# Patient Record
Sex: Male | Born: 1970 | Race: Black or African American | Hispanic: No | Marital: Single
Health system: Midwestern US, Community
[De-identification: ages and names within clinical notes are randomized; demographics above are authoritative.]

## PROBLEM LIST (undated history)

## (undated) DIAGNOSIS — K219 Gastro-esophageal reflux disease without esophagitis: Secondary | ICD-10-CM

## (undated) DIAGNOSIS — G43909 Migraine, unspecified, not intractable, without status migrainosus: Secondary | ICD-10-CM

## (undated) DIAGNOSIS — N529 Male erectile dysfunction, unspecified: Secondary | ICD-10-CM

## (undated) DIAGNOSIS — T884XXA Failed or difficult intubation, initial encounter: Secondary | ICD-10-CM

## (undated) DIAGNOSIS — I10 Essential (primary) hypertension: Secondary | ICD-10-CM

## (undated) DIAGNOSIS — K603 Anal fistula, unspecified: Secondary | ICD-10-CM

## (undated) DIAGNOSIS — E785 Hyperlipidemia, unspecified: Secondary | ICD-10-CM

## (undated) DIAGNOSIS — T8859XA Other complications of anesthesia, initial encounter: Secondary | ICD-10-CM

## (undated) DIAGNOSIS — K611 Rectal abscess: Secondary | ICD-10-CM

## (undated) DIAGNOSIS — K5730 Diverticulosis of large intestine without perforation or abscess without bleeding: Secondary | ICD-10-CM

## (undated) DIAGNOSIS — K649 Unspecified hemorrhoids: Secondary | ICD-10-CM

## (undated) DIAGNOSIS — Z87898 Personal history of other specified conditions: Secondary | ICD-10-CM

## (undated) DIAGNOSIS — Z8719 Personal history of other diseases of the digestive system: Secondary | ICD-10-CM

## (undated) DIAGNOSIS — G4733 Obstructive sleep apnea (adult) (pediatric): Secondary | ICD-10-CM

## (undated) DIAGNOSIS — T4145XA Adverse effect of unspecified anesthetic, initial encounter: Secondary | ICD-10-CM

## (undated) DIAGNOSIS — R52 Pain, unspecified: Secondary | ICD-10-CM

## (undated) DIAGNOSIS — Z0000 Encounter for general adult medical examination without abnormal findings: Secondary | ICD-10-CM

## (undated) DIAGNOSIS — R300 Dysuria: Principal | ICD-10-CM

## (undated) DIAGNOSIS — R053 Chronic cough: Secondary | ICD-10-CM

## (undated) DIAGNOSIS — J4521 Mild intermittent asthma with (acute) exacerbation: Secondary | ICD-10-CM

## (undated) DIAGNOSIS — J209 Acute bronchitis, unspecified: Secondary | ICD-10-CM

## (undated) DIAGNOSIS — G8929 Other chronic pain: Principal | ICD-10-CM

## (undated) DIAGNOSIS — E119 Type 2 diabetes mellitus without complications: Principal | ICD-10-CM

## (undated) DIAGNOSIS — R319 Hematuria, unspecified: Secondary | ICD-10-CM

## (undated) DIAGNOSIS — K4091 Unilateral inguinal hernia, without obstruction or gangrene, recurrent: Secondary | ICD-10-CM

## (undated) DIAGNOSIS — R0609 Other forms of dyspnea: Secondary | ICD-10-CM

## (undated) DIAGNOSIS — R519 Headache, unspecified: Secondary | ICD-10-CM

## (undated) DIAGNOSIS — R350 Frequency of micturition: Secondary | ICD-10-CM

## (undated) DIAGNOSIS — E1165 Type 2 diabetes mellitus with hyperglycemia: Principal | ICD-10-CM

## (undated) DIAGNOSIS — J069 Acute upper respiratory infection, unspecified: Secondary | ICD-10-CM

## (undated) DIAGNOSIS — J40 Bronchitis, not specified as acute or chronic: Secondary | ICD-10-CM

## (undated) DIAGNOSIS — G44209 Tension-type headache, unspecified, not intractable: Principal | ICD-10-CM

## (undated) DIAGNOSIS — E782 Mixed hyperlipidemia: Principal | ICD-10-CM

## (undated) HISTORY — DX: Hyperlipidemia, unspecified: E78.5

## (undated) HISTORY — DX: Rectal abscess: K61.1

## (undated) HISTORY — PX: CHOLECYSTECTOMY: SHX55

## (undated) HISTORY — PX: ROTATOR CUFF REPAIR: SHX139

## (undated) HISTORY — DX: Migraine, unspecified, not intractable, without status migrainosus: G43.909

## (undated) HISTORY — DX: Gastro-esophageal reflux disease without esophagitis: K21.9

## (undated) HISTORY — DX: Male erectile dysfunction, unspecified: N52.9

## (undated) HISTORY — PX: TONSILLECTOMY AND ADENOIDECTOMY: SUR1326

---

## 1997-06-20 HISTORY — PX: LUMBAR SPINE SURGERY: SHX701

## 2010-06-12 ENCOUNTER — Observation Stay: Payer: Self-pay | Admitting: Internal Medicine

## 2011-06-21 HISTORY — PX: OTHER SURGICAL HISTORY: SHX169

## 2015-09-22 DIAGNOSIS — M75122 Complete rotator cuff tear or rupture of left shoulder, not specified as traumatic: Secondary | ICD-10-CM | POA: Diagnosis not present

## 2015-09-22 DIAGNOSIS — R29898 Other symptoms and signs involving the musculoskeletal system: Secondary | ICD-10-CM | POA: Diagnosis not present

## 2015-09-30 DIAGNOSIS — M25612 Stiffness of left shoulder, not elsewhere classified: Secondary | ICD-10-CM | POA: Diagnosis not present

## 2015-09-30 DIAGNOSIS — M25512 Pain in left shoulder: Secondary | ICD-10-CM | POA: Diagnosis not present

## 2015-10-06 DIAGNOSIS — M7582 Other shoulder lesions, left shoulder: Secondary | ICD-10-CM | POA: Diagnosis not present

## 2015-10-06 DIAGNOSIS — M25512 Pain in left shoulder: Secondary | ICD-10-CM | POA: Diagnosis not present

## 2015-10-06 DIAGNOSIS — M75122 Complete rotator cuff tear or rupture of left shoulder, not specified as traumatic: Secondary | ICD-10-CM | POA: Diagnosis not present

## 2015-10-08 DIAGNOSIS — M75122 Complete rotator cuff tear or rupture of left shoulder, not specified as traumatic: Secondary | ICD-10-CM | POA: Diagnosis not present

## 2015-10-08 DIAGNOSIS — I10 Essential (primary) hypertension: Secondary | ICD-10-CM | POA: Diagnosis not present

## 2015-10-08 DIAGNOSIS — R1013 Epigastric pain: Secondary | ICD-10-CM | POA: Diagnosis not present

## 2015-10-08 DIAGNOSIS — M25512 Pain in left shoulder: Secondary | ICD-10-CM | POA: Diagnosis not present

## 2015-10-08 DIAGNOSIS — R29898 Other symptoms and signs involving the musculoskeletal system: Secondary | ICD-10-CM | POA: Diagnosis not present

## 2015-10-08 DIAGNOSIS — R197 Diarrhea, unspecified: Secondary | ICD-10-CM | POA: Diagnosis not present

## 2015-10-08 DIAGNOSIS — R1011 Right upper quadrant pain: Secondary | ICD-10-CM | POA: Diagnosis not present

## 2015-10-08 DIAGNOSIS — R112 Nausea with vomiting, unspecified: Secondary | ICD-10-CM | POA: Diagnosis not present

## 2015-10-12 DIAGNOSIS — M25512 Pain in left shoulder: Secondary | ICD-10-CM | POA: Diagnosis not present

## 2015-10-12 DIAGNOSIS — M25612 Stiffness of left shoulder, not elsewhere classified: Secondary | ICD-10-CM | POA: Diagnosis not present

## 2015-10-12 DIAGNOSIS — R29898 Other symptoms and signs involving the musculoskeletal system: Secondary | ICD-10-CM | POA: Diagnosis not present

## 2015-10-14 DIAGNOSIS — K529 Noninfective gastroenteritis and colitis, unspecified: Secondary | ICD-10-CM | POA: Diagnosis not present

## 2015-10-14 DIAGNOSIS — M25612 Stiffness of left shoulder, not elsewhere classified: Secondary | ICD-10-CM | POA: Diagnosis not present

## 2015-10-14 DIAGNOSIS — M25512 Pain in left shoulder: Secondary | ICD-10-CM | POA: Diagnosis not present

## 2015-10-14 DIAGNOSIS — R1011 Right upper quadrant pain: Secondary | ICD-10-CM | POA: Diagnosis not present

## 2015-10-15 DIAGNOSIS — M5137 Other intervertebral disc degeneration, lumbosacral region: Secondary | ICD-10-CM | POA: Diagnosis not present

## 2015-10-15 DIAGNOSIS — M545 Low back pain: Secondary | ICD-10-CM | POA: Diagnosis not present

## 2015-10-20 DIAGNOSIS — M7542 Impingement syndrome of left shoulder: Secondary | ICD-10-CM | POA: Diagnosis not present

## 2015-10-20 DIAGNOSIS — M25512 Pain in left shoulder: Secondary | ICD-10-CM | POA: Diagnosis not present

## 2015-10-20 DIAGNOSIS — M25612 Stiffness of left shoulder, not elsewhere classified: Secondary | ICD-10-CM | POA: Diagnosis not present

## 2015-10-20 DIAGNOSIS — R29898 Other symptoms and signs involving the musculoskeletal system: Secondary | ICD-10-CM | POA: Diagnosis not present

## 2015-10-21 DIAGNOSIS — M5137 Other intervertebral disc degeneration, lumbosacral region: Secondary | ICD-10-CM | POA: Diagnosis not present

## 2015-10-21 DIAGNOSIS — M545 Low back pain: Secondary | ICD-10-CM | POA: Diagnosis not present

## 2015-10-26 DIAGNOSIS — M545 Low back pain: Secondary | ICD-10-CM | POA: Diagnosis not present

## 2015-10-26 DIAGNOSIS — M5137 Other intervertebral disc degeneration, lumbosacral region: Secondary | ICD-10-CM | POA: Diagnosis not present

## 2015-10-28 DIAGNOSIS — M25512 Pain in left shoulder: Secondary | ICD-10-CM | POA: Diagnosis not present

## 2015-10-28 DIAGNOSIS — M7542 Impingement syndrome of left shoulder: Secondary | ICD-10-CM | POA: Diagnosis not present

## 2015-10-28 DIAGNOSIS — M7582 Other shoulder lesions, left shoulder: Secondary | ICD-10-CM | POA: Diagnosis not present

## 2015-10-28 DIAGNOSIS — M75122 Complete rotator cuff tear or rupture of left shoulder, not specified as traumatic: Secondary | ICD-10-CM | POA: Diagnosis not present

## 2015-10-28 DIAGNOSIS — R29898 Other symptoms and signs involving the musculoskeletal system: Secondary | ICD-10-CM | POA: Diagnosis not present

## 2015-11-12 ENCOUNTER — Emergency Department: Payer: Federal, State, Local not specified - PPO | Admitting: Anesthesiology

## 2015-11-12 ENCOUNTER — Emergency Department: Payer: Federal, State, Local not specified - PPO

## 2015-11-12 ENCOUNTER — Encounter
Admission: EM | Disposition: A | Discharge status: Home / Self Care | Payer: Self-pay | Source: Home / Self Care | Attending: Emergency Medicine

## 2015-11-12 ENCOUNTER — Observation Stay
Admission: EM | Admit: 2015-11-12 | Discharge: 2015-11-15 | Disposition: A | Discharge status: Home / Self Care | Payer: Federal, State, Local not specified - PPO | Attending: Surgery | Admitting: Surgery

## 2015-11-12 ENCOUNTER — Encounter: Payer: Self-pay | Admitting: Emergency Medicine

## 2015-11-12 DIAGNOSIS — Z9889 Other specified postprocedural states: Secondary | ICD-10-CM | POA: Insufficient documentation

## 2015-11-12 DIAGNOSIS — K4090 Unilateral inguinal hernia, without obstruction or gangrene, not specified as recurrent: Secondary | ICD-10-CM | POA: Insufficient documentation

## 2015-11-12 DIAGNOSIS — K611 Rectal abscess: Secondary | ICD-10-CM | POA: Diagnosis present

## 2015-11-12 DIAGNOSIS — A419 Sepsis, unspecified organism: Secondary | ICD-10-CM | POA: Diagnosis not present

## 2015-11-12 DIAGNOSIS — Z9049 Acquired absence of other specified parts of digestive tract: Secondary | ICD-10-CM | POA: Insufficient documentation

## 2015-11-12 DIAGNOSIS — Z87891 Personal history of nicotine dependence: Secondary | ICD-10-CM | POA: Insufficient documentation

## 2015-11-12 DIAGNOSIS — Z888 Allergy status to other drugs, medicaments and biological substances status: Secondary | ICD-10-CM | POA: Diagnosis not present

## 2015-11-12 DIAGNOSIS — R339 Retention of urine, unspecified: Secondary | ICD-10-CM | POA: Insufficient documentation

## 2015-11-12 DIAGNOSIS — R000 Tachycardia, unspecified: Secondary | ICD-10-CM | POA: Insufficient documentation

## 2015-11-12 DIAGNOSIS — K613 Ischiorectal abscess: Principal | ICD-10-CM | POA: Insufficient documentation

## 2015-11-12 DIAGNOSIS — E876 Hypokalemia: Secondary | ICD-10-CM | POA: Diagnosis not present

## 2015-11-12 DIAGNOSIS — T888 Other specified complications of surgical and medical care, not elsewhere classified: Secondary | ICD-10-CM | POA: Diagnosis not present

## 2015-11-12 DIAGNOSIS — K610 Anal abscess: Secondary | ICD-10-CM | POA: Diagnosis not present

## 2015-11-12 DIAGNOSIS — I10 Essential (primary) hypertension: Secondary | ICD-10-CM | POA: Diagnosis not present

## 2015-11-12 DIAGNOSIS — G4730 Sleep apnea, unspecified: Secondary | ICD-10-CM | POA: Diagnosis not present

## 2015-11-12 DIAGNOSIS — Z79899 Other long term (current) drug therapy: Secondary | ICD-10-CM | POA: Diagnosis not present

## 2015-11-12 DIAGNOSIS — Z87898 Personal history of other specified conditions: Secondary | ICD-10-CM

## 2015-11-12 DIAGNOSIS — R197 Diarrhea, unspecified: Secondary | ICD-10-CM | POA: Insufficient documentation

## 2015-11-12 HISTORY — DX: Essential (primary) hypertension: I10

## 2015-11-12 HISTORY — PX: INCISION AND DRAINAGE ABSCESS: SHX5864

## 2015-11-12 HISTORY — DX: Personal history of other specified conditions: Z87.898

## 2015-11-12 LAB — URINALYSIS COMPLETE WITH MICROSCOPIC (ARMC ONLY)
BILIRUBIN URINE: NEGATIVE
Bacteria, UA: NONE SEEN
GLUCOSE, UA: NEGATIVE mg/dL
KETONES UR: NEGATIVE mg/dL
LEUKOCYTES UA: NEGATIVE
Nitrite: NEGATIVE
PH: 6 (ref 5.0–8.0)
Protein, ur: NEGATIVE mg/dL
SQUAMOUS EPITHELIAL / LPF: NONE SEEN
Specific Gravity, Urine: 1.014 (ref 1.005–1.030)

## 2015-11-12 LAB — COMPREHENSIVE METABOLIC PANEL
ALBUMIN: 4.5 g/dL (ref 3.5–5.0)
ALT: 19 U/L (ref 17–63)
AST: 16 U/L (ref 15–41)
Alkaline Phosphatase: 66 U/L (ref 38–126)
Anion gap: 9 (ref 5–15)
BUN: 10 mg/dL (ref 6–20)
CHLORIDE: 99 mmol/L — AB (ref 101–111)
CO2: 27 mmol/L (ref 22–32)
Calcium: 9.4 mg/dL (ref 8.9–10.3)
Creatinine, Ser: 0.97 mg/dL (ref 0.61–1.24)
GFR calc Af Amer: >60 mL/min (ref >60)
GLUCOSE: 121 mg/dL — AB (ref 65–99)
POTASSIUM: 3 mmol/L — AB (ref 3.5–5.1)
Sodium: 135 mmol/L (ref 135–145)
Total Bilirubin: 0.9 mg/dL (ref 0.3–1.2)
Total Protein: 8.4 g/dL — ABNORMAL HIGH (ref 6.5–8.1)

## 2015-11-12 LAB — CBC WITH DIFFERENTIAL/PLATELET
BASOS ABS: 0 10*3/uL (ref 0–0.1)
Basophils Relative: 0 %
Eosinophils Absolute: 0.5 10*3/uL (ref 0–0.7)
Eosinophils Relative: 3 %
HEMATOCRIT: 38.2 % — AB (ref 40.0–52.0)
HEMOGLOBIN: 13.2 g/dL (ref 13.0–18.0)
Lymphocytes Relative: 7 %
Lymphs Abs: 1.2 10*3/uL (ref 1.0–3.6)
MCH: 29.3 pg (ref 26.0–34.0)
MCHC: 34.5 g/dL (ref 32.0–36.0)
MCV: 85 fL (ref 80.0–100.0)
Monocytes Absolute: 1 10*3/uL (ref 0.2–1.0)
NEUTROS ABS: 13.9 10*3/uL — AB (ref 1.4–6.5)
Platelets: 282 10*3/uL (ref 150–440)
RBC: 4.49 MIL/uL (ref 4.40–5.90)
RDW: 13.2 % (ref 11.5–14.5)
WBC: 16.6 10*3/uL — ABNORMAL HIGH (ref 3.8–10.6)

## 2015-11-12 LAB — LACTIC ACID, PLASMA: LACTIC ACID, VENOUS: 1 mmol/L (ref 0.5–2.0)

## 2015-11-12 SURGERY — INCISION AND DRAINAGE, ABSCESS
Anesthesia: General | Site: Rectum | Wound class: Dirty or Infected

## 2015-11-12 MED ORDER — MORPHINE SULFATE (PF) 4 MG/ML IV SOLN: 4.0000 mg | INTRAVENOUS | Status: DC | PRN

## 2015-11-12 MED ORDER — KETOROLAC TROMETHAMINE 30 MG/ML IJ SOLN
30.0000 mg | Freq: Four times a day (QID) | INTRAMUSCULAR | Status: DC
  Administered 2015-11-13 – 2015-11-15 (×9): 30 mg via INTRAVENOUS
  Filled 2015-11-12 (×10): qty 1

## 2015-11-12 MED ORDER — MORPHINE SULFATE (PF) 4 MG/ML IV SOLN
4.0000 mg | Freq: Once | INTRAVENOUS | Status: AC
  Administered 2015-11-12: 4 mg via INTRAVENOUS
  Filled 2015-11-12: qty 1

## 2015-11-12 MED ORDER — ACETAMINOPHEN 500 MG PO TABS
1000.0000 mg | ORAL_TABLET | Freq: Four times a day (QID) | ORAL | Status: DC
  Administered 2015-11-13 – 2015-11-15 (×9): 1000 mg via ORAL
  Filled 2015-11-12 (×10): qty 2

## 2015-11-12 MED ORDER — PIPERACILLIN-TAZOBACTAM 3.375 G IVPB 30 MIN
3.3750 g | Freq: Once | INTRAVENOUS | Status: AC
  Administered 2015-11-12: 3.375 g via INTRAVENOUS
  Filled 2015-11-12: qty 50

## 2015-11-12 MED ORDER — POTASSIUM CHLORIDE 10 MEQ/100ML IV SOLN
10.0000 meq | Freq: Once | INTRAVENOUS | Status: AC
  Administered 2015-11-12: 10 meq via INTRAVENOUS
  Filled 2015-11-12: qty 100

## 2015-11-12 MED ORDER — KETOROLAC TROMETHAMINE 30 MG/ML IJ SOLN
INTRAMUSCULAR | Status: DC | PRN
  Administered 2015-11-12: 30 mg via INTRAVENOUS

## 2015-11-12 MED ORDER — IRBESARTAN 150 MG PO TABS: 300.0000 mg | ORAL_TABLET | Freq: Every day | ORAL | Status: DC

## 2015-11-12 MED ORDER — IOPAMIDOL (ISOVUE-300) INJECTION 61%
100.0000 mL | Freq: Once | INTRAVENOUS | Status: AC | PRN
  Administered 2015-11-12: 100 mL via INTRAVENOUS

## 2015-11-12 MED ORDER — FENTANYL CITRATE (PF) 100 MCG/2ML IJ SOLN
INTRAMUSCULAR | Status: DC | PRN
  Administered 2015-11-12: 100 ug via INTRAVENOUS
  Administered 2015-11-12: 50 ug via INTRAVENOUS

## 2015-11-12 MED ORDER — PHENYLEPHRINE HCL 10 MG/ML IJ SOLN
INTRAMUSCULAR | Status: DC | PRN
  Administered 2015-11-12 (×2): 150 ug via INTRAVENOUS

## 2015-11-12 MED ORDER — ONDANSETRON HCL 4 MG/2ML IJ SOLN
INTRAMUSCULAR | Status: DC | PRN
Start: 2015-11-12 — End: 2015-11-12
  Administered 2015-11-12: 4 mg via INTRAVENOUS

## 2015-11-12 MED ORDER — BUPIVACAINE-EPINEPHRINE (PF) 0.25% -1:200000 IJ SOLN
INTRAMUSCULAR | Status: AC
  Filled 2015-11-12: qty 30

## 2015-11-12 MED ORDER — SODIUM CHLORIDE 0.9 % IV SOLN
INTRAVENOUS | Status: DC | PRN
  Administered 2015-11-12: 21:00:00 via INTRAVENOUS

## 2015-11-12 MED ORDER — VALSARTAN-HYDROCHLOROTHIAZIDE 320-25 MG PO TABS: 1.0000 | ORAL_TABLET | Freq: Every day | ORAL | Status: DC

## 2015-11-12 MED ORDER — ONDANSETRON HCL 4 MG/2ML IJ SOLN: 4.0000 mg | Freq: Once | INTRAMUSCULAR | Status: DC | PRN

## 2015-11-12 MED ORDER — ENOXAPARIN SODIUM 40 MG/0.4ML ~~LOC~~ SOLN
40.0000 mg | SUBCUTANEOUS | Status: DC
  Administered 2015-11-12 – 2015-11-14 (×3): 40 mg via SUBCUTANEOUS
  Filled 2015-11-12 (×3): qty 0.4

## 2015-11-12 MED ORDER — METRONIDAZOLE IN NACL 5-0.79 MG/ML-% IV SOLN
500.0000 mg | Freq: Three times a day (TID) | INTRAVENOUS | Status: DC
  Administered 2015-11-12 – 2015-11-14 (×5): 500 mg via INTRAVENOUS
  Filled 2015-11-12 (×7): qty 100

## 2015-11-12 MED ORDER — SODIUM CHLORIDE 0.9 % IV BOLUS (SEPSIS)
500.0000 mL | INTRAVENOUS | Status: AC
  Administered 2015-11-12: 500 mL via INTRAVENOUS

## 2015-11-12 MED ORDER — HYDROCHLOROTHIAZIDE 25 MG PO TABS: 25.0000 mg | ORAL_TABLET | Freq: Every day | ORAL | Status: DC

## 2015-11-12 MED ORDER — PROPOFOL 10 MG/ML IV BOLUS
INTRAVENOUS | Status: DC | PRN
  Administered 2015-11-12: 200 mg via INTRAVENOUS

## 2015-11-12 MED ORDER — METOPROLOL SUCCINATE ER 25 MG PO TB24: 25.0000 mg | ORAL_TABLET | Freq: Every day | ORAL | Status: DC

## 2015-11-12 MED ORDER — LACTATED RINGERS IV BOLUS (SEPSIS)
1000.0000 mL | Freq: Once | INTRAVENOUS | Status: DC
  Filled 2015-11-12: qty 1000

## 2015-11-12 MED ORDER — REMIFENTANIL HCL 1 MG IV SOLR
INTRAVENOUS | Status: DC | PRN
  Administered 2015-11-12: .15 ug/kg/min via INTRAVENOUS

## 2015-11-12 MED ORDER — ONDANSETRON 8 MG PO TBDP: 4.0000 mg | ORAL_TABLET | Freq: Four times a day (QID) | ORAL | Status: DC | PRN

## 2015-11-12 MED ORDER — HYDROMORPHONE HCL 1 MG/ML IJ SOLN
1.0000 mg | INTRAMUSCULAR | Status: AC
  Administered 2015-11-12: 1 mg via INTRAVENOUS
  Filled 2015-11-12: qty 1

## 2015-11-12 MED ORDER — ONDANSETRON HCL 4 MG/2ML IJ SOLN: 4.0000 mg | Freq: Four times a day (QID) | INTRAMUSCULAR | Status: DC | PRN

## 2015-11-12 MED ORDER — OXYCODONE HCL 5 MG PO TABS
5.0000 mg | ORAL_TABLET | ORAL | Status: DC | PRN
  Administered 2015-11-12: 5 mg via ORAL
  Administered 2015-11-13 – 2015-11-15 (×2): 10 mg via ORAL
  Filled 2015-11-12 (×3): qty 2
  Filled 2015-11-12: qty 1

## 2015-11-12 MED ORDER — FENTANYL CITRATE (PF) 100 MCG/2ML IJ SOLN
25.0000 ug | INTRAMUSCULAR | Status: DC | PRN
Start: 2015-11-12 — End: 2015-11-12

## 2015-11-12 MED ORDER — BUPIVACAINE-EPINEPHRINE (PF) 0.25% -1:200000 IJ SOLN
INTRAMUSCULAR | Status: DC | PRN
  Administered 2015-11-12: 30 mL

## 2015-11-12 MED ORDER — LIDOCAINE HCL (CARDIAC) 20 MG/ML IV SOLN
INTRAVENOUS | Status: DC | PRN
  Administered 2015-11-12 (×2): 100 mg via INTRAVENOUS

## 2015-11-12 MED ORDER — POTASSIUM CHLORIDE CRYS ER 20 MEQ PO TBCR
40.0000 meq | EXTENDED_RELEASE_TABLET | Freq: Once | ORAL | Status: AC
  Administered 2015-11-12: 40 meq via ORAL
  Filled 2015-11-12: qty 2

## 2015-11-12 MED ORDER — LACTATED RINGERS IV SOLN
INTRAVENOUS | Status: DC
  Administered 2015-11-12 – 2015-11-14 (×5): via INTRAVENOUS

## 2015-11-12 MED ORDER — DIATRIZOATE MEGLUMINE & SODIUM 66-10 % PO SOLN
15.0000 mL | Freq: Once | ORAL | Status: AC
  Administered 2015-11-12: 15 mL via ORAL

## 2015-11-12 MED ORDER — SUCCINYLCHOLINE CHLORIDE 20 MG/ML IJ SOLN
INTRAMUSCULAR | Status: DC | PRN
  Administered 2015-11-12: 130 mg via INTRAVENOUS

## 2015-11-12 MED ORDER — CIPROFLOXACIN IN D5W 400 MG/200ML IV SOLN
400.0000 mg | Freq: Two times a day (BID) | INTRAVENOUS | Status: DC
  Administered 2015-11-13 – 2015-11-14 (×3): 400 mg via INTRAVENOUS
  Filled 2015-11-12 (×6): qty 200

## 2015-11-12 MED ORDER — PANTOPRAZOLE SODIUM 40 MG IV SOLR
40.0000 mg | Freq: Every day | INTRAVENOUS | Status: DC
Start: 2015-11-12 — End: 2015-11-15
  Administered 2015-11-12 – 2015-11-15 (×3): 40 mg via INTRAVENOUS
  Filled 2015-11-12 (×3): qty 40

## 2015-11-12 SURGICAL SUPPLY — 23 items
BLADE CLIPPER SURG (BLADE) ×2 IMPLANT
BLADE SURG 15 STRL LF DISP TIS (BLADE) ×1 IMPLANT
BLADE SURG 15 STRL SS (BLADE) ×1
CANISTER SUCT 1200ML W/VALVE (MISCELLANEOUS) ×2 IMPLANT
DRAPE LEGGINS SURG 28X43 STRL (DRAPES) ×2 IMPLANT
DRAPE UNDER BUTTOCK W/FLU (DRAPES) ×2 IMPLANT
ELECT REM PT RETURN 9FT ADLT (ELECTROSURGICAL) ×2
ELECTRODE REM PT RTRN 9FT ADLT (ELECTROSURGICAL) ×1 IMPLANT
GLOVE BIO SURGEON STRL SZ7 (GLOVE) ×2 IMPLANT
GOWN STRL REUS W/ TWL LRG LVL3 (GOWN DISPOSABLE) ×2 IMPLANT
GOWN STRL REUS W/TWL LRG LVL3 (GOWN DISPOSABLE) ×2
JELLY LUB 2OZ STRL (MISCELLANEOUS) ×1
JELLY LUBE 2OZ STRL (MISCELLANEOUS) ×1 IMPLANT
NEEDLE HYPO 22GX1.5 SAFETY (NEEDLE) ×2 IMPLANT
NS IRRIG 1000ML POUR BTL (IV SOLUTION) ×2 IMPLANT
PACK BASIN MINOR ARMC (MISCELLANEOUS) ×2 IMPLANT
PAD PREP 24X41 OB/GYN DISP (PERSONAL CARE ITEMS) ×2 IMPLANT
SCRUB POVIDONE IODINE 4 OZ (MISCELLANEOUS) ×2 IMPLANT
SOL PREP PVP 2OZ (MISCELLANEOUS) ×2
SOLUTION PREP PVP 2OZ (MISCELLANEOUS) ×1 IMPLANT
SPONGE LAP 18X18 5 PK (GAUZE/BANDAGES/DRESSINGS) ×4 IMPLANT
SWAB DUAL CULTURE TRANS RED ST (MISCELLANEOUS) ×2 IMPLANT
SYR 20CC LL (SYRINGE) ×2 IMPLANT

## 2015-11-12 NOTE — ED Notes (Signed)
Bladder scan performed , noted.

## 2015-11-12 NOTE — Progress Notes (Signed)
Patient ID: Curtis Harper, male   DOB: [birth year], 45 y.o.   MRN: [id number]  History of Present Illness Curtis Harper is a 45 y.o. male asked to see in consultation by the emergency room several coronary to the perianal abscess. Patient reports that he started having severe anorectal pain starting yesterday. Pain is sharp and is worsening when she is sits down. He also had associated urinary retention. He is however able to pass urine. He did have a similar episode a few years ago and that percent the same way with retention and renal abscess. He reports some subjective chills. Pain is nonradiating and only partially relieved with some IV narcotics given by the emergency room. Workup has included CBC reveals an increase in white count and CT scan revealing a perianal abscess extending into the prostate. This is deepened there is also an additional collection superior to this. No evidence of Fournier gangrene's are no evidence of necrotizing infection  Past Medical History Past Medical History  Diagnosis Date  . Urinary retention   . Abscess   . Hypertension      Past Surgical History  Procedure Laterality Date  . Abscess drainage    . Rotator cuff repair    . Cholecystectomy      Allergies  Allergen Reactions  . Lisinopril Other (See Comments)    Reaction: unknown    Current Facility-Administered Medications  Medication Dose Route Frequency Provider Last Rate Last Dose  . lactated ringers bolus 1,000 mL  1,000 mL Intravenous Once Leafy Ro, MD      . piperacillin-tazobactam (ZOSYN) IVPB 3.375 g  3.375 g Intravenous Once Loleta Rose, MD 100 mL/hr at 11/12/15 1958 3.375 g at 11/12/15 1958  . potassium chloride 10 mEq in 100 mL IVPB  10 mEq Intravenous Once Loleta Rose, MD 100 mL/hr at 11/12/15 1942 10 mEq at 11/12/15 1942   Current Outpatient Prescriptions  Medication Sig Dispense Refill  . metoprolol succinate (TOPROL-XL) 25 MG 24 hr tablet Take 25 mg by mouth daily.    Marland Kitchen  omeprazole (PRILOSEC) 40 MG capsule Take 40 mg by mouth daily.    . valsartan-hydrochlorothiazide (DIOVAN-HCT) 320-25 MG tablet Take 1 tablet by mouth daily.      Family History History reviewed. No pertinent family history.    Social History Social History  Substance Use Topics  . Smoking status: Former Games developer  . Smokeless tobacco: Never Used  . Alcohol Use: Yes     Comment: Occ     ROS 10 point ROS performed and is otherwise negative  Physical Exam Blood pressure 116/64, pulse 115, temperature 99.9 F (37.7 C), temperature source Oral, resp. rate 28, height  (1.803 m), weight 122.471 kg (270 lb), SpO2 97 %.  CONSTITUTIONAL: Patient is no acute distress but does complain of pain. Non toxic EYES: Pupils equal, round, and reactive to light, Sclera non-icteric. EARS, NOSE, MOUTH AND THROAT: The oropharynx is clear. Oral mucosa is pink and moist. Hearing is intact to voice.  NECK: Trachea is midline, and there is no jugular venous distension. Thyroid is without palpable abnormalities. LYMPH NODES:  Lymph nodes in the neck are not enlarged. RESPIRATORY:  Lungs are clear, and breath sounds are equal bilaterally. Normal respiratory effort without pathologic use of accessory muscles. CARDIOVASCULAR: Heart is regular without murmurs, gallops, or rubs. GI: The abdomen is  soft, nontender, and nondistended. There were no palpable masses. There was no hepatosplenomegaly. There were normal bowel sounds. Rectal: perirectal abscess,  fluctuance and squeeze tenderness to palpation, no evidence of necrotizing infection MUSCULOSKELETAL:  Normal muscle strength and tone in all four extremities.    SKIN: Skin turgor is normal. There are no pathologic skin lesions.  NEUROLOGIC:  Motor and sensation is grossly normal.  Cranial nerves are grossly intact. PSYCH:  Alert and oriented to person, place and time. Affect is normal.  Data Reviewed  I have personally reviewed the patient's imaging and  medical records.    Assessment/Plan  45 year old morbidly obese male with a history of sleep apnea now presents with perianal/ rectal abscess extending into the prostate causing some urinary symptoms. Discussed with the patient in detail about the need for exam under anesthesia and I&D in the OR. Procedure discussed with the patient in detail, risk, benefits and possible complications including but not limited to: Bleeding, infection, recurrence, injury to adjacent structures. He understands and wishes to proceed. We'll start resuscitation with crystalloids and  broad-spectrum antibiotics. All questions were answered.  Sterling Bigiego Pabon, MD FACS  Diego F Pabon 11/12/2015, 8:21 PM

## 2015-11-12 NOTE — ED Notes (Signed)
While in triage with this RN pt c/o dizziness at this time. EKG obtained due to patient dizziness and tachycardia.

## 2015-11-12 NOTE — Anesthesia Preprocedure Evaluation (Signed)
Anesthesia Evaluation  Patient identified by MRN, date of birth, ID band Patient awake    Reviewed: Allergy & Precautions, NPO status , Patient's Chart, lab work & pertinent test results  History of Anesthesia Complications Negative for: history of anesthetic complications  Airway Mallampati: III       Dental   Pulmonary sleep apnea and Continuous Positive Airway Pressure Ventilation , former smoker,           Cardiovascular hypertension, Pt. on medications and Pt. on home beta blockers      Neuro/Psych negative neurological ROS     GI/Hepatic Neg liver ROS, GERD  Medicated and Controlled,  Endo/Other  negative endocrine ROS  Renal/GU negative Renal ROS     Musculoskeletal   Abdominal   Peds  Hematology negative hematology ROS (+)   Anesthesia Other Findings   Reproductive/Obstetrics                             Anesthesia Physical Anesthesia Plan  ASA: II and emergent  Anesthesia Plan: General   Post-op Pain Management:    Induction: Intravenous  Airway Management Planned: LMA  Additional Equipment:   Intra-op Plan:   Post-operative Plan:   Informed Consent: I have reviewed the patients History and Physical, chart, labs and discussed the procedure including the risks, benefits and alternatives for the proposed anesthesia with the patient or authorized representative who has indicated his/her understanding and acceptance.     Plan Discussed with:   Anesthesia Plan Comments:         Anesthesia Quick Evaluation

## 2015-11-12 NOTE — ED Provider Notes (Signed)
[facility] Emergency Department Provider Note  ____________________________________________  Time seen: Approximately 5:55 PM  I have reviewed the triage vital signs and the nursing notes.   HISTORY  Chief Complaint Urinary Retention and Pain L buttocks     HPI Curtis Harper is a 45 y.o. male with a past medical history that he describes as having a buttock abscess that was so severe it was causing impingement on his urethra which caused urinary retention.  He had a recurrence that did not require surgery a few years ago, and he presents today complaining of acute onset urinary retention again today with a sore spot on his buttocks and he is afraid he has a new abscess or infection that is causing symptoms.  He denies fever and general malaise as well as pain in lower abdomen.  He feels like he cannot urinate and has not not urinated for about 45 hours.  He denies chest pain, shortness of breath, nausea, vomiting, diarrhea, dysuria (prior to the urinary retention).  His symptoms are getting gradually worse over time and nothing is making them better.  He states that his prior episodes and his surgery took place in [place].   Past Medical History  Diagnosis Date  . Urinary retention   . Abscess   . Hypertension     Patient Active Problem List   Diagnosis Date Noted  . Perirectal abscess 11/12/2015  . Perianal abscess     Past Surgical History  Procedure Laterality Date  . Abscess drainage    . Rotator cuff repair    . Cholecystectomy      No current outpatient prescriptions on file.  Allergies Lisinopril  History reviewed. No pertinent family history.  Social History Social History  Substance Use Topics  . Smoking status: Former Games developermoker  . Smokeless tobacco: Never Used  . Alcohol Use: Yes     Comment: Occ    Review of Systems Constitutional: Denies fever/chills Eyes: No visual changes. ENT: No sore throat. Cardiovascular:  Denies chest pain. Respiratory: Denies shortness of breath. Gastrointestinal: Lower abdominal pain.  No nausea, no vomiting.  No diarrhea.  No constipation.  Left buttock tender area Genitourinary: +urinary retention.   Musculoskeletal: Negative for back pain. Skin: Negative for rash. Neurological: Negative for headaches, focal weakness or numbness.  10-point ROS otherwise negative.  ____________________________________________   PHYSICAL EXAM:  VITAL SIGNS: ED Triage Vitals  Enc Vitals Group     BP 11/12/15 1514 137/73 mmHg     Pulse Rate 11/12/15 1514 116     Resp 11/12/15 1514 18     Temp 11/12/15 1514 99.9 F (37.7 C)     Temp Source 11/12/15 1514 Oral     SpO2 11/12/15 1514 96 %     Weight 11/12/15 1514 270 lb (122.471 kg)     Height 11/12/15 1514 5\' 11"  (1.803 m)     Head Cir --      Peak Flow --      Pain Score 11/12/15 1515 10     Pain Loc --      Pain Edu? --      Excl. in GC? --     Constitutional: Alert and oriented. Generally well-appearing but does appear uncomfortable Eyes: Conjunctivae are normal. PERRL. EOMI. Head: Atraumatic. Nose: No congestion/rhinnorhea. Mouth/Throat: Mucous membranes are moist.  Oropharynx non-erythematous. Neck: No stridor.  No meningeal signs.   Cardiovascular: Tachycardia, regular rhythm. Good peripheral circulation. Grossly normal heart sounds.   Respiratory:  Normal respiratory effort.  No retractions. Lungs CTAB. Gastrointestinal: Obesity, mild lower abdominal tenderness to palpation.  Approximately 2 cm in diameter fluctuant area on the left buttock just inferior to the middle which is only mildly tender to palpation. Musculoskeletal: No lower extremity tenderness nor edema. No gross deformities of extremities. Neurologic:  Normal speech and language. No gross focal neurologic deficits are appreciated.  Skin:  Skin is warm, dry and intact. No rash noted. Psychiatric: Mood and affect are normal. Speech and behavior are  normal.  ____________________________________________   LABS (all labs ordered are listed, but only abnormal results are displayed)  Labs Reviewed  CBC WITH DIFFERENTIAL/PLATELET - Abnormal; Notable for the following:    WBC 16.6 (*)    HCT 38.2 (*)    Neutro Abs 13.9 (*)    All other components within normal limits  COMPREHENSIVE METABOLIC PANEL - Abnormal; Notable for the following:    Potassium 3.0 (*)    Chloride 99 (*)    Glucose, Bld 121 (*)    Total Protein 8.4 (*)    All other components within normal limits  URINALYSIS COMPLETEWITH MICROSCOPIC ([facility] ONLY) - Abnormal; Notable for the following:    Color, Urine YELLOW (*)    APPearance CLEAR (*)    Hgb urine dipstick 2+ (*)    All other components within normal limits  URINE CULTURE  CULTURE, BLOOD (ROUTINE X 2)  CULTURE, BLOOD (ROUTINE X 2)  LACTIC ACID, PLASMA   ____________________________________________  EKG  ED ECG REPORT I, Aleene Swanner, the attending physician, personally viewed and interpreted this ECG.  Date: 11/12/2015 EKG Time: 15:39 Rate: 113 Rhythm: Sinus tachycardia QRS Axis: normal Intervals: normal ST/T Wave abnormalities: normal Conduction Disturbances: none Narrative Interpretation: unremarkable  ____________________________________________  RADIOLOGY   Ct Abdomen Pelvis W Contrast  11/12/2015  CLINICAL DATA:  Pt states hx of abscesses that block off his urinary tract. Pt states last night he began having difficulty urinating and also c/o pain to his L buttocks. EXAM: CT ABDOMEN AND PELVIS WITH CONTRAST TECHNIQUE: Multidetector CT imaging of the abdomen and pelvis was performed using the standard protocol following bolus administration of intravenous contrast. CONTRAST:  ISOVUE-300 IOPAMIDOL (ISOVUE-300) INJECTION 61% COMPARISON:  None. FINDINGS: Lower chest:  Normal Hepatobiliary: Status post cholecystectomy.  Liver is normal. Pancreas: Normal Spleen: Normal Adrenals/Urinary  Tract: Adrenal glands are normal. Kidneys are normal. No stones. No hydronephrosis. Bladder is normal. Stomach/Bowel: Nonobstructive bowel gas pattern. Appendix, large bowel, small bowel, and stomach are normal. Vascular/Lymphatic: No vascular abnormalities. Small retroperitoneal lymph nodes nonpathologic by size Reproductive: Fat containing left inguinal hernia. Other: No ascites. Perianal rim enhancing low-attenuation structure in the left perineum measures 54 x 26 mm. It demonstrates average attenuation value of 28. There is mild surrounding inflammation. There is a second adjacent loculation immediately posterior to the anus measuring about 17 mm. Musculoskeletal: No acute findings IMPRESSION: 5.4 cm perianal abscess extending from the posterior inferior edge of the prostate posteriorly 5.4 cm in the left perineum. There is an adjacent 16 mm abscess in the midline posteriorly. Electronically Signed   By: Esperanza Heir M.D.   On: 11/12/2015 19:21    ____________________________________________   PROCEDURES  Procedure(s) performed: None  Critical Care performed: No ____________________________________________   INITIAL IMPRESSION / ASSESSMENT AND PLAN / ED COURSE  Pertinent labs & imaging results that were available during my care of the patient were reviewed by me and considered in my medical decision making (see chart for details).  6:17 PM Given the patient's located history, leukocytosis, and lower abdominal pain as well as the urinary retention and history of surgery, I will proceed with a CT scan of his abdomen and pelvis with oral and IV contrast.  He is refusing a Foley catheter at this time in spite of his urinary retention, but I told them that we will proceed with one if he changes his mind.  There were several 100 mL on his bladder scan but those scans are notoriously inaccurate.  I will defer management of the fluctuant lesion on his buttock until after we obtain the results of  his CT scan.  I am giving him more morphine for comfort.  I am giving him a small fluid bolus given his leukocytosis as well as 10 mEq of IV potassium and 40 mEq of oral potassium for his potassium of 3.0.  Lactate is 1, I will cancel the repeat.   (Note that documentation was delayed due to multiple ED patients requiring immediate care.)  The patient's CT scan was notable for a large 5.5 cm perianal abscess the tract all the way to the prostate as well as a smaller adjacent loculated abscess that is approximately 1.6 cm in diameter.  He technically meet septic criteria based on his leukocytosis and tachycardia with a source, and I am giving him Zosyn for empiric coverage, but he does not have severe sepsis based on his normal blood pressure and normal lactate, and thus does not require 30 mL/kg of normal saline.  He has a very low-grade fever.  His pain is still uncontrolled after morphine and I am giving him a dose of Dilaudid 1 mg IV.  I spoke with Dr. Everlene Farrier with general surgery who came to the emergency department and talked with me in person and will admit the patient for drainage in the operating room.  ____________________________________________  FINAL CLINICAL IMPRESSION(S) / ED DIAGNOSES  Final diagnoses:  Hypokalemia  Urinary retention  Perianal abscess  Sepsis, due to unspecified organism [facility])     MEDICATIONS GIVEN DURING THIS VISIT:  Medications  lactated ringers bolus 1,000 mL (not administered)  bupivacaine-epinephrine (MARCAINE W/ EPI) 0.25% -1:200000 injection (30 mLs Other Given 11/12/15 2129)  morphine 4 MG/ML injection 4 mg (4 mg Intravenous Given 11/12/15 1615)  morphine 4 MG/ML injection 4 mg (4 mg Intravenous Given 11/12/15 1820)  potassium chloride 10 mEq in 100 mL IVPB (10 mEq Intravenous Transfusing/Transfer 11/12/15 2021)  potassium chloride SA (K-DUR,KLOR-CON) CR tablet 40 mEq (40 mEq Oral Given 11/12/15 1822)  sodium chloride 0.9 % bolus 500 mL (0 mLs Intravenous  Stopped 11/12/15 1940)  diatrizoate meglumine-sodium (GASTROGRAFIN) 66-10 % solution 15 mL (15 mLs Oral Given 11/12/15 1824)  iopamidol (ISOVUE-300) 61 % injection 100 mL (100 mLs Intravenous Contrast Given 11/12/15 1905)  piperacillin-tazobactam (ZOSYN) IVPB 3.375 g (3.375 g Intravenous Transfusing/Transfer 11/12/15 2021)  HYDROmorphone (DILAUDID) injection 1 mg (1 mg Intravenous Given 11/12/15 1957)     NEW OUTPATIENT MEDICATIONS STARTED DURING THIS VISIT:  Current Discharge Medication List        Note:  This document was prepared using Dragon voice recognition software and may include unintentional dictation errors.   Loleta Rose, MD 11/12/15 2138

## 2015-11-12 NOTE — ED Notes (Signed)
Pt states hx of abscesses that block off his urinary tract. Pt states last night he began having difficulty urinating and also c/o pain to his L buttocks. NAD noted at this time, pt is noted to be uncomfortable in triage.

## 2015-11-12 NOTE — Anesthesia Procedure Notes (Signed)
Procedure Name: Intubation Date/Time: 11/12/2015 8:55 PM Performed by: Shirlee LimerickMARION, Gray Doering Pre-anesthesia Checklist: Patient identified, Emergency Drugs available, Suction available and Patient being monitored Patient Re-evaluated:Patient Re-evaluated prior to inductionOxygen Delivery Method: Circle system utilized Preoxygenation: Pre-oxygenation with 100% oxygen Intubation Type: IV induction Laryngoscope Size: Mac and 3 Grade View: Grade II Tube type: Oral Tube size: 7.0 mm Number of attempts: 2 Airway Equipment and Method: Bougie stylet Secured at: 23 cm Tube secured with: Tape Dental Injury: Teeth and Oropharynx as per pre-operative assessment and Injury to lip  Future Recommendations: Recommend- induction with short-acting agent, and alternative techniques readily available

## 2015-11-12 NOTE — Transfer of Care (Signed)
Immediate Anesthesia Transfer of Care Note  Patient: Curtis Harper  Procedure(s) Performed: Procedure(s): INCISION AND DRAINAGE ABSCESS (N/A)  Patient Location: PACU  Anesthesia Type:General  Level of Consciousness: awake and patient cooperative  Airway & Oxygen Therapy: Patient Spontanous Breathing and Patient connected to face mask oxygen  Post-op Assessment: Report given to RN and Post -op Vital signs reviewed and stable  Post vital signs: Reviewed and stable  Last Vitals:  Filed Vitals:   11/12/15 2021 11/12/15 2155  BP: 109/76 119/58  Pulse: 115 116  Temp: 37.7 C 36.9 C  Resp: 18 26    Last Pain:  Filed Vitals:   11/12/15 2155  PainSc: 10-Worst pain ever         Complications: No apparent anesthesia complications

## 2015-11-12 NOTE — Op Note (Signed)
  11/12/2015  9:37 PM  PATIENT:  Curtis Harper  45 y.o. male  PRE-OPERATIVE DIAGNOSIS:  Perianal and ischiorectal abscess  POST-OPERATIVE DIAGNOSIS:  Same  PROCEDURE:  1. Incision and drainage of complex perianal and ischiorectal abscesses                           2. Sharp excisional debridement of skin subcutaneous tissue and muscle measuring 8 square centimeters    FINDINGS: LEFT ISCHIORECTAL ABSCESS AND DEEP PERIANAL ABSCESS. NO EVIDENCE OF TRACKING INTO THE RECTAL CANAL  SURGEON:  Surgeon(s) and Role:    * Diego F Everlene FarrierPabon, MD - Primary   ANESTHESIA: GETA    DICTATION:  Patient was playing about proceeding detail, risk benefits possible complications and a consent was obtained. The patient taken to the operating room and placed in LITHOTOMY position. He was prepped and draped in the usual fashion. Inspection revealed two separate abscesses, one left ischiorectal and the other left perianal. Incision was created and pus was drained and cultured. There was complex luculations that we were able to lyse with a combination of finger fracture and suction device. All the loculations were breaking down. Using a sharp curette we debrided the sub q tissue down to the muscle to include fascia. Hemostasis was obtained with electrocautery. Irrigation with normal saline and the wound was packed with half-inch packing. Marcaine quarter percent with epinephrine was injected around the wound site. Needle and laparotomy counts were correct and there were no immediate complications  Leafy Roiego F Pabon, MD

## 2015-11-12 NOTE — Anesthesia Postprocedure Evaluation (Signed)
Anesthesia Post Note  Patient: Curtis Harper  Procedure(s) Performed: Procedure(s) (LRB): INCISION AND DRAINAGE ABSCESS (N/A)  Patient location during evaluation: PACU Anesthesia Type: General Level of consciousness: awake and alert Pain management: pain level controlled Vital Signs Assessment: post-procedure vital signs reviewed and stable Respiratory status: spontaneous breathing and respiratory function stable Cardiovascular status: stable Anesthetic complications: no    Last Vitals:  Filed Vitals:   11/12/15 2220 11/12/15 2245  BP: 98/65 94/43  Pulse: 107 103  Temp: 37.2 C 37.4 C  Resp: 18 18    Last Pain:  Filed Vitals:   11/12/15 2310  PainSc: 5                  KEPHART,WILLIAM K

## 2015-11-13 ENCOUNTER — Encounter: Payer: Self-pay | Admitting: Surgery

## 2015-11-13 DIAGNOSIS — Z9049 Acquired absence of other specified parts of digestive tract: Secondary | ICD-10-CM | POA: Diagnosis not present

## 2015-11-13 DIAGNOSIS — K610 Anal abscess: Secondary | ICD-10-CM | POA: Diagnosis not present

## 2015-11-13 DIAGNOSIS — Z9889 Other specified postprocedural states: Secondary | ICD-10-CM | POA: Diagnosis not present

## 2015-11-13 DIAGNOSIS — K613 Ischiorectal abscess: Secondary | ICD-10-CM | POA: Diagnosis not present

## 2015-11-13 DIAGNOSIS — K4090 Unilateral inguinal hernia, without obstruction or gangrene, not specified as recurrent: Secondary | ICD-10-CM | POA: Diagnosis not present

## 2015-11-13 DIAGNOSIS — Z79899 Other long term (current) drug therapy: Secondary | ICD-10-CM | POA: Diagnosis not present

## 2015-11-13 DIAGNOSIS — Z888 Allergy status to other drugs, medicaments and biological substances status: Secondary | ICD-10-CM | POA: Diagnosis not present

## 2015-11-13 DIAGNOSIS — G4730 Sleep apnea, unspecified: Secondary | ICD-10-CM | POA: Diagnosis not present

## 2015-11-13 DIAGNOSIS — R197 Diarrhea, unspecified: Secondary | ICD-10-CM | POA: Diagnosis not present

## 2015-11-13 DIAGNOSIS — R000 Tachycardia, unspecified: Secondary | ICD-10-CM | POA: Diagnosis not present

## 2015-11-13 DIAGNOSIS — R339 Retention of urine, unspecified: Secondary | ICD-10-CM | POA: Diagnosis not present

## 2015-11-13 DIAGNOSIS — I10 Essential (primary) hypertension: Secondary | ICD-10-CM | POA: Diagnosis not present

## 2015-11-13 DIAGNOSIS — Z87891 Personal history of nicotine dependence: Secondary | ICD-10-CM | POA: Diagnosis not present

## 2015-11-13 LAB — BASIC METABOLIC PANEL
ANION GAP: 10 (ref 5–15)
BUN: 14 mg/dL (ref 6–20)
CALCIUM: 8.3 mg/dL — AB (ref 8.9–10.3)
CHLORIDE: 101 mmol/L (ref 101–111)
CO2: 25 mmol/L (ref 22–32)
Creatinine, Ser: 1.76 mg/dL — ABNORMAL HIGH (ref 0.61–1.24)
GFR calc Af Amer: 52 mL/min — ABNORMAL LOW (ref >60)
GFR calc non Af Amer: 45 mL/min — ABNORMAL LOW (ref >60)
GLUCOSE: 141 mg/dL — AB (ref 65–99)
POTASSIUM: 3.3 mmol/L — AB (ref 3.5–5.1)
Sodium: 136 mmol/L (ref 135–145)

## 2015-11-13 LAB — CBC
HEMATOCRIT: 31.9 % — AB (ref 40.0–52.0)
HEMOGLOBIN: 10.8 g/dL — AB (ref 13.0–18.0)
MCH: 29.5 pg (ref 26.0–34.0)
MCHC: 33.9 g/dL (ref 32.0–36.0)
MCV: 87.3 fL (ref 80.0–100.0)
Platelets: 240 10*3/uL (ref 150–440)
RBC: 3.65 MIL/uL — ABNORMAL LOW (ref 4.40–5.90)
RDW: 13.4 % (ref 11.5–14.5)
WBC: 17.1 10*3/uL — ABNORMAL HIGH (ref 3.8–10.6)

## 2015-11-13 LAB — URINE CULTURE
Culture: NO GROWTH
SPECIAL REQUESTS: NORMAL

## 2015-11-13 MED ORDER — LACTATED RINGERS IV BOLUS (SEPSIS)
1000.0000 mL | Freq: Once | INTRAVENOUS | Status: AC
  Administered 2015-11-13: 1000 mL via INTRAVENOUS

## 2015-11-13 MED ORDER — LOPERAMIDE HCL 2 MG PO CAPS
2.0000 mg | ORAL_CAPSULE | ORAL | Status: DC | PRN
  Administered 2015-11-13: 2 mg via ORAL
  Filled 2015-11-13: qty 1

## 2015-11-13 NOTE — Progress Notes (Signed)
Notified Dr. Tonita CongWoodham of frequent loose stools. Per Dr. Tonita CongWoodham okay to order immodium prn as recommended dosage per pharmacy.

## 2015-11-13 NOTE — Progress Notes (Signed)
Hold three morning BP meds per Dr. Tonita CongWoodham

## 2015-11-13 NOTE — Progress Notes (Signed)
1 Day Post-Op   Subjective:  Patient reports feeling much better today than he did before drainage of his abscess. He has been tolerating a diet and his pain has been well-controlled.  Vital signs in last 24 hours: Temp:  [98.1 F (36.7 C)-99.9 F (37.7 C)] 98.4 F (36.9 C) (05/26 1419) Pulse Rate:  [85-116] 85 (05/26 1419) Resp:  [17-28] 20 (05/26 0627) BP: (83-137)/(43-76) 91/53 mmHg (05/26 1419) SpO2:  [94 %-100 %] 97 % (05/26 0627) Weight:  [122.471 kg (270 lb)] 122.471 kg (270 lb) (05/25 1514) Last BM Date: 11/13/15  Intake/Output from previous day: 05/25 0701 - 05/26 0700 In: 1692 [P.O.:480; I.V.:910; IV Piggyback:302] Out: 1265 [Urine:1250; Blood:15]  GI: soft, non-tender; bowel sounds normal; no masses,  no organomegaly rectal exam deferred as he is less than 24 hours from drainage  Lab Results:  CBC  Recent Labs  11/12/15 1530 11/13/15 0336  WBC 16.6* 17.1*  HGB 13.2 10.8*  HCT 38.2* 31.9*  PLT 282 240   CMP     Component Value Date/Time   NA 136 11/13/2015 0336   K 3.3* 11/13/2015 0336   CL 101 11/13/2015 0336   CO2 25 11/13/2015 0336   GLUCOSE 141* 11/13/2015 0336   BUN 14 11/13/2015 0336   CREATININE 1.76* 11/13/2015 0336   CALCIUM 8.3* 11/13/2015 0336   PROT 8.4* 11/12/2015 1530   ALBUMIN 4.5 11/12/2015 1530   AST 16 11/12/2015 1530   ALT 19 11/12/2015 1530   ALKPHOS 66 11/12/2015 1530   BILITOT 0.9 11/12/2015 1530   GFRNONAA 45* 11/13/2015 0336   GFRAA 52* 11/13/2015 0336   PT/INR No results for input(s): LABPROT, INR in the last 72 hours.  Studies/Results: Ct Abdomen Pelvis W Contrast  11/12/2015  CLINICAL DATA:  Pt states hx of abscesses that block off his urinary tract. Pt states last night he began having difficulty urinating and also c/o pain to his L buttocks. EXAM: CT ABDOMEN AND PELVIS WITH CONTRAST TECHNIQUE: Multidetector CT imaging of the abdomen and pelvis was performed using the standard protocol following bolus administration of  intravenous contrast. CONTRAST:  ISOVUE-300 IOPAMIDOL (ISOVUE-300) INJECTION 61% COMPARISON:  None. FINDINGS: Lower chest:  Normal Hepatobiliary: Status post cholecystectomy.  Liver is normal. Pancreas: Normal Spleen: Normal Adrenals/Urinary Tract: Adrenal glands are normal. Kidneys are normal. No stones. No hydronephrosis. Bladder is normal. Stomach/Bowel: Nonobstructive bowel gas pattern. Appendix, large bowel, small bowel, and stomach are normal. Vascular/Lymphatic: No vascular abnormalities. Small retroperitoneal lymph nodes nonpathologic by size Reproductive: Fat containing left inguinal hernia. Other: No ascites. Perianal rim enhancing low-attenuation structure in the left perineum measures 54 x 26 mm. It demonstrates average attenuation value of 28. There is mild surrounding inflammation. There is a second adjacent loculation immediately posterior to the anus measuring about 17 mm. Musculoskeletal: No acute findings IMPRESSION: 5.4 cm perianal abscess extending from the posterior inferior edge of the prostate posteriorly 5.4 cm in the left perineum. There is an adjacent 16 mm abscess in the midline posteriorly. Electronically Signed   By: Esperanza Heir M.D.   On: 11/12/2015 19:21    Assessment/Plan: 45 year old male status post incision and drainage of large perirectal abscess. 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 Discussed with the patient given his operative findings and response to therapy and will likely require IV antibiotics for at least another day. We will hold all of his antihypertensives until his blood pressure returns to his usual  state of hypertension. Encourage ambulation and incentive spirometer usage. Recheck labs in the morning and transitioned to oral antibiotics once white blood cell count is trending down.   Ricarda Frameharles Sherby Moncayo, MD FACS General Surgeon  11/13/2015

## 2015-11-13 NOTE — Progress Notes (Signed)
Dr. Everlene FarrierPabon notified of hypotension and c/o light-headedness with standing and lying;  Acknowledged, New orders written. strongly encouraged pt. Not to get OOB without assistance. Windy Carinaurner,Heber Hoog K, RN 7:01 AM 11/13/2015

## 2015-11-13 NOTE — Care Management (Signed)
No discharge needs have been identified by care team

## 2015-11-14 DIAGNOSIS — K613 Ischiorectal abscess: Secondary | ICD-10-CM | POA: Diagnosis not present

## 2015-11-14 DIAGNOSIS — R339 Retention of urine, unspecified: Secondary | ICD-10-CM | POA: Diagnosis not present

## 2015-11-14 DIAGNOSIS — K610 Anal abscess: Secondary | ICD-10-CM | POA: Diagnosis not present

## 2015-11-14 DIAGNOSIS — I10 Essential (primary) hypertension: Secondary | ICD-10-CM | POA: Diagnosis not present

## 2015-11-14 DIAGNOSIS — Z9889 Other specified postprocedural states: Secondary | ICD-10-CM | POA: Diagnosis not present

## 2015-11-14 DIAGNOSIS — Z87891 Personal history of nicotine dependence: Secondary | ICD-10-CM | POA: Diagnosis not present

## 2015-11-14 DIAGNOSIS — K4090 Unilateral inguinal hernia, without obstruction or gangrene, not specified as recurrent: Secondary | ICD-10-CM | POA: Diagnosis not present

## 2015-11-14 DIAGNOSIS — Z9049 Acquired absence of other specified parts of digestive tract: Secondary | ICD-10-CM | POA: Diagnosis not present

## 2015-11-14 DIAGNOSIS — R197 Diarrhea, unspecified: Secondary | ICD-10-CM | POA: Diagnosis not present

## 2015-11-14 DIAGNOSIS — Z79899 Other long term (current) drug therapy: Secondary | ICD-10-CM | POA: Diagnosis not present

## 2015-11-14 DIAGNOSIS — Z888 Allergy status to other drugs, medicaments and biological substances status: Secondary | ICD-10-CM | POA: Diagnosis not present

## 2015-11-14 DIAGNOSIS — R000 Tachycardia, unspecified: Secondary | ICD-10-CM | POA: Diagnosis not present

## 2015-11-14 DIAGNOSIS — G4730 Sleep apnea, unspecified: Secondary | ICD-10-CM | POA: Diagnosis not present

## 2015-11-14 LAB — BASIC METABOLIC PANEL
ANION GAP: 6 (ref 5–15)
BUN: 13 mg/dL (ref 6–20)
CALCIUM: 8.6 mg/dL — AB (ref 8.9–10.3)
CHLORIDE: 103 mmol/L (ref 101–111)
CO2: 30 mmol/L (ref 22–32)
CREATININE: 0.99 mg/dL (ref 0.61–1.24)
GLUCOSE: 86 mg/dL (ref 65–99)
Potassium: 3.1 mmol/L — ABNORMAL LOW (ref 3.5–5.1)
Sodium: 139 mmol/L (ref 135–145)

## 2015-11-14 LAB — CBC
HEMATOCRIT: 33.5 % — AB (ref 40.0–52.0)
HEMOGLOBIN: 11.4 g/dL — AB (ref 13.0–18.0)
MCH: 29.7 pg (ref 26.0–34.0)
MCHC: 34.2 g/dL (ref 32.0–36.0)
MCV: 86.9 fL (ref 80.0–100.0)
Platelets: 269 10*3/uL (ref 150–440)
RBC: 3.85 MIL/uL — ABNORMAL LOW (ref 4.40–5.90)
RDW: 13.2 % (ref 11.5–14.5)
WBC: 10.9 10*3/uL — ABNORMAL HIGH (ref 3.8–10.6)

## 2015-11-14 MED ORDER — HYDROCHLOROTHIAZIDE 25 MG PO TABS: 25.0000 mg | ORAL_TABLET | Freq: Every day | ORAL | Status: DC

## 2015-11-14 MED ORDER — ALUM & MAG HYDROXIDE-SIMETH 200-200-20 MG/5ML PO SUSP
30.0000 mL | ORAL | Status: DC | PRN
  Administered 2015-11-14: 30 mL via ORAL
  Filled 2015-11-14: qty 30

## 2015-11-14 MED ORDER — MENTHOL 3 MG MT LOZG
1.0000 | LOZENGE | OROMUCOSAL | Status: DC | PRN
  Administered 2015-11-14 (×2): 3 mg via ORAL
  Filled 2015-11-14 (×2): qty 9

## 2015-11-14 MED ORDER — IRBESARTAN 150 MG PO TABS: 300.0000 mg | ORAL_TABLET | Freq: Every day | ORAL | Status: DC

## 2015-11-14 MED ORDER — METRONIDAZOLE 500 MG PO TABS
500.0000 mg | ORAL_TABLET | Freq: Three times a day (TID) | ORAL | Status: DC
  Administered 2015-11-14 – 2015-11-15 (×3): 500 mg via ORAL
  Filled 2015-11-14 (×3): qty 1

## 2015-11-14 MED ORDER — CIPROFLOXACIN HCL 500 MG PO TABS
500.0000 mg | ORAL_TABLET | Freq: Two times a day (BID) | ORAL | Status: DC
  Administered 2015-11-14 – 2015-11-15 (×2): 500 mg via ORAL
  Filled 2015-11-14 (×2): qty 1

## 2015-11-14 MED ORDER — METOPROLOL SUCCINATE ER 25 MG PO TB24: 25.0000 mg | ORAL_TABLET | Freq: Every day | ORAL | Status: DC

## 2015-11-14 NOTE — Progress Notes (Signed)
Per Dr. Tonita CongWoodham okay to place order for Cepacol lozenges for sore throat. Also okay to place parameters for BP meds of do not give if Systolic BP is less than 120.

## 2015-11-14 NOTE — Progress Notes (Signed)
2 Days Post-Op   Subjective:  Patient reports feeling better this morning. Has had some diarrhea since initiation of antibiotics but this is improving. Denies any fevers or chills and has been tolerating his diet. Vital signs in last 24 hours: Temp:  [97.6 F (36.4 C)-98.7 F (37.1 C)] 97.6 F (36.4 C) (05/27 0415) Pulse Rate:  [78-90] 86 (05/27 0929) Resp:  [18] 18 (05/27 0415) BP: (91-127)/(53-63) 117/63 mmHg (05/27 0929) SpO2:  [94 %-100 %] 100 % (05/27 0415) Last BM Date: 11/13/15  Intake/Output from previous day: 05/26 0701 - 05/27 0700 In: 4445 [P.O.:720; I.V.:2775; IV Piggyback:950] Out: 2526 [Urine:2525; Stool:1]  GI: soft, non-tender; bowel sounds normal; no masses,  no organomegaly  Rectal: Rectal exam performed which shows packing in place to a perirectal abscess without evidence of purulence and no palpable fluctuance or induration from previous abscess cavity.  Lab Results:  CBC  Recent Labs  11/13/15 0336 11/14/15 0625  WBC 17.1* 10.9*  HGB 10.8* 11.4*  HCT 31.9* 33.5*  PLT 240 269   CMP     Component Value Date/Time   NA 139 11/14/2015 0625   K 3.1* 11/14/2015 0625   CL 103 11/14/2015 0625   CO2 30 11/14/2015 0625   GLUCOSE 86 11/14/2015 0625   BUN 13 11/14/2015 0625   CREATININE 0.99 11/14/2015 0625   CALCIUM 8.6* 11/14/2015 0625   PROT 8.4* 11/12/2015 1530   ALBUMIN 4.5 11/12/2015 1530   AST 16 11/12/2015 1530   ALT 19 11/12/2015 1530   ALKPHOS 66 11/12/2015 1530   BILITOT 0.9 11/12/2015 1530   GFRNONAA >60 11/14/2015 0625   GFRAA >60 11/14/2015 0625   PT/INR No results for input(s): LABPROT, INR in the last 72 hours.  Studies/Results: Ct Abdomen Pelvis W Contrast  11/12/2015  CLINICAL DATA:  Pt states hx of abscesses that block off his urinary tract. Pt states last night he began having difficulty urinating and also c/o pain to his L buttocks. EXAM: CT ABDOMEN AND PELVIS WITH CONTRAST TECHNIQUE: Multidetector CT imaging of the abdomen and  pelvis was performed using the standard protocol following bolus administration of intravenous contrast. CONTRAST:  ISOVUE-300 IOPAMIDOL (ISOVUE-300) INJECTION 61% COMPARISON:  None. FINDINGS: Lower chest:  Normal Hepatobiliary: Status post cholecystectomy.  Liver is normal. Pancreas: Normal Spleen: Normal Adrenals/Urinary Tract: Adrenal glands are normal. Kidneys are normal. No stones. No hydronephrosis. Bladder is normal. Stomach/Bowel: Nonobstructive bowel gas pattern. Appendix, large bowel, small bowel, and stomach are normal. Vascular/Lymphatic: No vascular abnormalities. Small retroperitoneal lymph nodes nonpathologic by size Reproductive: Fat containing left inguinal hernia. Other: No ascites. Perianal rim enhancing low-attenuation structure in the left perineum measures 54 x 26 mm. It demonstrates average attenuation value of 28. There is mild surrounding inflammation. There is a second adjacent loculation immediately posterior to the anus measuring about 17 mm. Musculoskeletal: No acute findings IMPRESSION: 5.4 cm perianal abscess extending from the posterior inferior edge of the prostate posteriorly 5.4 cm in the left perineum. There is an adjacent 16 mm abscess in the midline posteriorly. Electronically Signed   By: Esperanza Heir M.D.   On: 11/12/2015 19:21    Assessment/Plan: 45 year old male status post I&D of large perirectal abscess. Labs have trended to near normal today. Plan to transition to oral antibiotics. If tolerates oral antibiotics and continues to be improved possible discharge home later today versus early tomorrow. Continue to hold blood pressure medications until patient returns to baseline hypertension. Encourage ambulation and incentive spirometer usage.  Ricarda Frameharles Berenis Corter, MD FACS General Surgeon  11/14/2015

## 2015-11-15 DIAGNOSIS — G4730 Sleep apnea, unspecified: Secondary | ICD-10-CM | POA: Diagnosis not present

## 2015-11-15 DIAGNOSIS — Z9049 Acquired absence of other specified parts of digestive tract: Secondary | ICD-10-CM | POA: Diagnosis not present

## 2015-11-15 DIAGNOSIS — I10 Essential (primary) hypertension: Secondary | ICD-10-CM | POA: Diagnosis not present

## 2015-11-15 DIAGNOSIS — R000 Tachycardia, unspecified: Secondary | ICD-10-CM | POA: Diagnosis not present

## 2015-11-15 DIAGNOSIS — Z9889 Other specified postprocedural states: Secondary | ICD-10-CM | POA: Diagnosis not present

## 2015-11-15 DIAGNOSIS — K613 Ischiorectal abscess: Secondary | ICD-10-CM | POA: Diagnosis not present

## 2015-11-15 DIAGNOSIS — R197 Diarrhea, unspecified: Secondary | ICD-10-CM | POA: Diagnosis not present

## 2015-11-15 DIAGNOSIS — R339 Retention of urine, unspecified: Secondary | ICD-10-CM | POA: Diagnosis not present

## 2015-11-15 DIAGNOSIS — Z87891 Personal history of nicotine dependence: Secondary | ICD-10-CM | POA: Diagnosis not present

## 2015-11-15 DIAGNOSIS — Z79899 Other long term (current) drug therapy: Secondary | ICD-10-CM | POA: Diagnosis not present

## 2015-11-15 DIAGNOSIS — K4090 Unilateral inguinal hernia, without obstruction or gangrene, not specified as recurrent: Secondary | ICD-10-CM | POA: Diagnosis not present

## 2015-11-15 DIAGNOSIS — K610 Anal abscess: Secondary | ICD-10-CM | POA: Diagnosis not present

## 2015-11-15 DIAGNOSIS — Z888 Allergy status to other drugs, medicaments and biological substances status: Secondary | ICD-10-CM | POA: Diagnosis not present

## 2015-11-15 LAB — CBC
HCT: 30.5 % — ABNORMAL LOW (ref 40.0–52.0)
Hemoglobin: 10.5 g/dL — ABNORMAL LOW (ref 13.0–18.0)
MCH: 29.6 pg (ref 26.0–34.0)
MCHC: 34.4 g/dL (ref 32.0–36.0)
MCV: 86.2 fL (ref 80.0–100.0)
PLATELETS: 285 10*3/uL (ref 150–440)
RBC: 3.54 MIL/uL — ABNORMAL LOW (ref 4.40–5.90)
RDW: 13.7 % (ref 11.5–14.5)
WBC: 8.5 10*3/uL (ref 3.8–10.6)

## 2015-11-15 MED ORDER — METRONIDAZOLE 500 MG PO TABS: 500.0000 mg | ORAL_TABLET | Freq: Three times a day (TID) | ORAL | Status: DC

## 2015-11-15 MED ORDER — CIPROFLOXACIN HCL 500 MG PO TABS: 500.0000 mg | ORAL_TABLET | Freq: Two times a day (BID) | ORAL | Status: DC

## 2015-11-15 MED ORDER — OXYCODONE HCL 5 MG PO TABS: 5.0000 mg | ORAL_TABLET | ORAL | Status: DC | PRN

## 2015-11-15 NOTE — Discharge Summary (Signed)
Patient ID: Curtis Harper: [id number] DOB/AGE: 1971/01/16 45 y.o.  Admit date: 11/12/2015 Discharge date: 11/15/2015  Discharge Diagnoses:  Perirectal Abscess  Procedures Performed: Incision and drainage of abscess  Discharged Condition: good  Hospital Course: Patient taken to the operating room from the ER with a perirectal abscess. Tolerated procedure well. Was kept in the hospital on antibiotics until labs were normalized and he was able to tolerate oral antibiotics. Patient was normotensive without blood pressure medications during his hospital stay and he was instructed to stay off of his medication until told to restart them.  Discharge Orders: HOME  Disposition: HOME  Discharge Medications:   Medication List    STOP taking these medications        metoprolol succinate 25 MG 24 hr tablet  Commonly known as:  TOPROL-XL     valsartan-hydrochlorothiazide 320-25 MG tablet  Commonly known as:  DIOVAN-HCT      TAKE these medications        ciprofloxacin 500 MG tablet  Commonly known as:  CIPRO  Take 1 tablet (500 mg total) by mouth 2 (two) times daily.     metroNIDAZOLE 500 MG tablet  Commonly known as:  FLAGYL  Take 1 tablet (500 mg total) by mouth every 8 (eight) hours.     omeprazole 40 MG capsule  Commonly known as:  PRILOSEC  Take 40 mg by mouth daily.     oxyCODONE 5 MG immediate release tablet  Commonly known as:  Oxy IR/ROXICODONE  Take 1-2 tablets (5-10 mg total) by mouth every 4 (four) hours as needed for moderate pain.         Follwup: Follow-up Information    Follow up with [facility]. Schedule an appointment as soon as possible for a visit in 3 days.   Why:  follow up perirectal abscess   Contact information:   [address] [place] [id number] [phone]      Signed: Ricarda FrameCharles Janautica Netzley 11/15/2015, 7:51 AM

## 2015-11-15 NOTE — Discharge Instructions (Signed)
Perirectal Abscess An abscess is an infected area that contains a collection of pus. A perirectal abscess is an abscess that is near the opening of the anus or around the rectum. A perirectal abscess can cause a lot of pain, especially during bowel movements. CAUSES This condition is almost always caused by an infection that starts in an anal gland. RISK FACTORS This condition is more likely to develop in:  People with diabetes or inflammatory bowel disease.  People whose body defense system (immune system) is weak.  People who have anal sex.  People who have a sexually transmitted disease (STD).  People who have certain kinds of cancers, such as rectal carcinoma, leukemia, or lymphoma. SYMPTOMS The main symptom of this condition is pain. The pain may be a throbbing pain that gets worse during bowel movements. Other symptoms include:  Fever.  Swelling.  Redness.  Bleeding.  Constipation. DIAGNOSIS The condition is diagnosed with a physical exam. If the abscess is not visible, a health care provider may need to place a finger inside the rectum to find the abscess. Sometimes, imaging tests are done to determine the size and location of the abscess. These tests may include:  An ultrasound.  An MRI.  A CT scan. TREATMENT This condition is usually treated with incision and drainage surgery. Incision and drainage surgery involves making an incision over the abscess to drain the pus. Treatment may also involve antibiotic medicine, pain medicine, stool softeners, or laxatives. HOME CARE INSTRUCTIONS  Take medicines only as directed by your health care provider.  If you were prescribed an antibiotic, finish all of it even if you start to feel better.  To relieve pain, try sitting:  In a warm, shallow bath (sitz bath).  On a heating pad with the setting on low.  On an inflatable donut-shaped cushion.  Follow any diet instructions as directed by your health care  provider.  Keep all follow-up visits as directed by your health care provider. This is important. SEEK MEDICAL CARE IF:  Your abscess is bleeding.  You have pain, swelling, or redness that is getting worse.  You are constipated.  You feel ill.  You have muscle aches or chills.  You have a fever.  Your symptoms return after the abscess has healed.   This information is not intended to replace advice given to you by your health care provider. Make sure you discuss any questions you have with your health care provider.   Document Released: 06/03/2000 Document Revised: 02/25/2015 Document Reviewed: 04/16/2014 Elsevier Interactive Patient Education 2016 [employer] Inc[employer]  

## 2015-11-15 NOTE — Progress Notes (Signed)
Pt d/c to home today.  Rx's given to pt w/all questions and concerns addressed.  D/C paperwork reviewed and education provided with all questions and concerns addressed.  Pt family at bedside for home transport.   

## 2015-11-16 LAB — WOUND CULTURE: Gram Stain: NONE SEEN

## 2015-11-17 LAB — ANAEROBIC CULTURE

## 2015-11-17 LAB — CULTURE, BLOOD (ROUTINE X 2)
CULTURE: NO GROWTH
Culture: NO GROWTH

## 2015-11-18 ENCOUNTER — Encounter: Payer: Self-pay | Admitting: Surgery

## 2015-11-18 ENCOUNTER — Ambulatory Visit (INDEPENDENT_AMBULATORY_CARE_PROVIDER_SITE_OTHER): Payer: Federal, State, Local not specified - PPO | Admitting: Surgery

## 2015-11-18 ENCOUNTER — Other Ambulatory Visit: Payer: Self-pay

## 2015-11-18 VITALS — BP 137/80 | HR 72 | Temp 98.2°F | Wt 271.0 lb

## 2015-11-18 DIAGNOSIS — Z09 Encounter for follow-up examination after completed treatment for conditions other than malignant neoplasm: Secondary | ICD-10-CM

## 2015-11-18 NOTE — Progress Notes (Signed)
S/p I/D ischiorectal and perianal abscess Improving overall Some mild pain as expected  PE NAD Rectal: small open wounds healing well, no infection  A/P Doing well Continue wound care and antibiotic rx F/u prn

## 2015-12-07 ENCOUNTER — Ambulatory Visit: Payer: Federal, State, Local not specified - PPO | Admitting: Family Medicine

## 2016-01-18 ENCOUNTER — Encounter: Payer: Self-pay | Admitting: Family Medicine

## 2016-01-18 ENCOUNTER — Ambulatory Visit (INDEPENDENT_AMBULATORY_CARE_PROVIDER_SITE_OTHER): Payer: Federal, State, Local not specified - PPO | Admitting: Family Medicine

## 2016-01-18 VITALS — BP 120/90 | HR 70 | Ht 71.0 in | Wt 259.0 lb

## 2016-01-18 DIAGNOSIS — K219 Gastro-esophageal reflux disease without esophagitis: Secondary | ICD-10-CM

## 2016-01-18 DIAGNOSIS — Z7189 Other specified counseling: Secondary | ICD-10-CM

## 2016-01-18 DIAGNOSIS — I10 Essential (primary) hypertension: Secondary | ICD-10-CM

## 2016-01-18 DIAGNOSIS — Z09 Encounter for follow-up examination after completed treatment for conditions other than malignant neoplasm: Secondary | ICD-10-CM | POA: Diagnosis not present

## 2016-01-18 DIAGNOSIS — G43809 Other migraine, not intractable, without status migrainosus: Secondary | ICD-10-CM | POA: Diagnosis not present

## 2016-01-18 DIAGNOSIS — Z7689 Persons encountering health services in other specified circumstances: Secondary | ICD-10-CM

## 2016-01-18 MED ORDER — INDOMETHACIN 50 MG RE SUPP: 50.0000 mg | Freq: Two times a day (BID) | RECTAL | Status: DC

## 2016-01-18 MED ORDER — METOPROLOL SUCCINATE ER 25 MG PO TB24: 25.0000 mg | ORAL_TABLET | Freq: Every day | ORAL | 1 refills | Status: DC

## 2016-01-18 MED ORDER — PANTOPRAZOLE SODIUM 40 MG PO TBEC: 40.0000 mg | DELAYED_RELEASE_TABLET | Freq: Every day | ORAL | 6 refills | Status: DC

## 2016-01-18 MED ORDER — VALSARTAN-HYDROCHLOROTHIAZIDE 320-25 MG PO TABS: 1.0000 | ORAL_TABLET | ORAL | 1 refills | Status: DC

## 2016-01-18 MED ORDER — INDOMETHACIN 50 MG PO CAPS: 50.0000 mg | ORAL_CAPSULE | Freq: Two times a day (BID) | ORAL | 0 refills | Status: DC

## 2016-01-18 NOTE — Addendum Note (Signed)
Addended by: Duanne Limerick on: 01/18/2016 04:43 PM   Modules accepted: Orders

## 2016-01-18 NOTE — Progress Notes (Signed)
Name: Curtis Harper   MRN: [id number]    DOB: 1971/02/10   Date:01/18/2016       Progress Note  Subjective  Chief Complaint  Chief Complaint  Patient presents with  . Establish Care    moved back here from [place]  . Hypertension    Patient to establish primary care   Hypertension  This is a chronic problem. The current episode started more than 1 year ago. The problem has been gradually improving since onset. The problem is controlled. Pertinent negatives include no anxiety, blurred vision, chest pain, headaches, malaise/fatigue, neck pain, orthopnea, palpitations, peripheral edema, PND, shortness of breath or sweats. There are no associated agents to hypertension. Past treatments include angiotensin blockers, beta blockers and diuretics. The current treatment provides mild improvement. There are no compliance problems.  There is no history of angina, kidney disease, CAD/MI, CVA, heart failure, left ventricular hypertrophy, PVD, renovascular disease or retinopathy. There is no history of chronic renal disease or a hypertension causing med.  Headache   This is a recurrent problem. The problem occurs intermittently. The problem has been waxing and waning. Associated symptoms include nausea. Pertinent negatives include no abdominal pain, back pain, blurred vision, coughing, dizziness, ear pain, fever, insomnia, neck pain, sore throat, tingling or weight loss. His past medical history is significant for hypertension.  Gastroesophageal Reflux  He complains of heartburn and nausea. He reports no abdominal pain, no belching, no chest pain, no choking, no coughing, no dysphagia, no early satiety, no globus sensation, no hoarse voice, no sore throat, no stridor or no wheezing. This is a chronic problem. The current episode started more than 1 year ago. The problem occurs frequently. The problem has been waxing and waning. Pertinent negatives include no melena or weight loss.    No problem-specific  Assessment & Plan notes found for this encounter.   Past Medical History:  Diagnosis Date  . Abscess   . Hypertension   . Migraine   . Urinary retention     Past Surgical History:  Procedure Laterality Date  . ABSCESS DRAINAGE    . CHOLECYSTECTOMY    . INCISION AND DRAINAGE ABSCESS N/A 11/12/2015   Procedure: INCISION AND DRAINAGE ABSCESS;  Surgeon: Leafy Ro, MD;  Location: [facility] ORS;  Service: General;  Laterality: N/A;  . ROTATOR CUFF REPAIR      Family History  Problem Relation Age of Onset  . Cancer Father     Social History   Social History  . Marital status: Divorced    Spouse name: N/A  . Number of children: N/A  . Years of education: N/A   Occupational History  . Not on file.   Social History Main Topics  . Smoking status: Former Games developer  . Smokeless tobacco: Never Used  . Alcohol use Yes     Comment: Occ  . Drug use: No  . Sexual activity: Yes   Other Topics Concern  . Not on file   Social History Narrative  . No narrative on file    Allergies  Allergen Reactions  . Lisinopril Cough    cough     Review of Systems  Constitutional: Negative for chills, fever, malaise/fatigue and weight loss.  HENT: Negative for ear discharge, ear pain, hoarse voice and sore throat.   Eyes: Negative for blurred vision.  Respiratory: Negative for cough, sputum production, choking, shortness of breath and wheezing.   Cardiovascular: Negative for chest pain, palpitations, orthopnea, leg swelling and PND.  Gastrointestinal:  Positive for heartburn and nausea. Negative for abdominal pain, blood in stool, constipation, diarrhea, dysphagia and melena.  Genitourinary: Negative for dysuria, frequency, hematuria and urgency.  Musculoskeletal: Negative for back pain, joint pain, myalgias and neck pain.  Skin: Negative for rash.  Neurological: Negative for dizziness, tingling, sensory change, focal weakness and headaches.  Endo/Heme/Allergies: Negative for environmental  allergies and polydipsia. Does not bruise/bleed easily.  Psychiatric/Behavioral: Negative for depression and suicidal ideas. The patient is not nervous/anxious and does not have insomnia.      Objective  Vitals:   01/18/16 1509  BP: 120/90  Pulse: 70  Weight: 259 lb (117.5 kg)  Height:  (1.803 m)    Physical Exam  Constitutional: He is oriented to person, place, and time and well-developed, well-nourished, and in no distress.  HENT:  Head: Normocephalic.  Right Ear: External ear normal.  Left Ear: External ear normal.  Nose: Nose normal.  Mouth/Throat: Oropharynx is clear and moist.  Eyes: Conjunctivae and EOM are normal. Pupils are equal, round, and reactive to light. Right eye exhibits no discharge. Left eye exhibits no discharge. No scleral icterus.  Neck: Normal range of motion. Neck supple. No JVD present. No tracheal deviation present. No thyromegaly present.  Cardiovascular: Normal rate, regular rhythm, normal heart sounds and intact distal pulses.  Exam reveals no gallop and no friction rub.   No murmur heard. Pulmonary/Chest: Breath sounds normal. No respiratory distress. He has no wheezes. He has no rales.  Abdominal: Soft. Bowel sounds are normal. He exhibits no mass. There is no hepatosplenomegaly. There is no tenderness. There is no rebound, no guarding and no CVA tenderness.  Genitourinary: Rectum normal and prostate normal.  Musculoskeletal: Normal range of motion. He exhibits no edema or tenderness.  Lymphadenopathy:    He has no cervical adenopathy.  Neurological: He is alert and oriented to person, place, and time. He has normal sensation, normal strength, normal reflexes and intact cranial nerves. No cranial nerve deficit.  Skin: Skin is warm. No rash noted.  Psychiatric: Mood and affect normal.  Nursing note and vitals reviewed.     Assessment & Plan  Problem List Items Addressed This Visit    None    Visit Diagnoses    Encounter to establish  care with new doctor    -  Primary   Encounter for recheck of abscess following incision and drainage       Gastroesophageal reflux disease, esophagitis presence not specified       Relevant Medications   pantoprazole (PROTONIX) 40 MG tablet   Other Relevant Orders   H. pylori antibody, IgG   Essential hypertension       Relevant Medications   metoprolol succinate (TOPROL-XL) 25 MG 24 hr tablet   valsartan-hydrochlorothiazide (DIOVAN-HCT) 320-25 MG tablet   Other Relevant Orders   Renal Function Panel   Other type of nonintractable migraine       Relevant Medications   Butalbital-APAP-Caffeine (FIORICET PO)   metoprolol succinate (TOPROL-XL) 25 MG 24 hr tablet   valsartan-hydrochlorothiazide (DIOVAN-HCT) 320-25 MG tablet   indomethacin (INDOCIN) 50 MG suppository 50 mg (Start on 01/18/2016 10:00 PM)   indomethacin (INDOCIN) 50 MG capsule   indomethacin (INDOCIN) 50 MG capsule    patient seen for 50 minutes    Dr. Hayden [facility]  01/18/16

## 2016-01-19 LAB — RENAL FUNCTION PANEL
ALBUMIN: 4.7 g/dL (ref 3.5–5.5)
BUN / CREAT RATIO: 12 (ref 9–20)
BUN: 11 mg/dL (ref 6–24)
CALCIUM: 9.4 mg/dL (ref 8.7–10.2)
CHLORIDE: 98 mmol/L (ref 96–106)
CO2: 24 mmol/L (ref 18–29)
CREATININE: 0.94 mg/dL (ref 0.76–1.27)
GFR calc Af Amer: 111 mL/min/{1.73_m2} (ref >59)
GFR calc non Af Amer: 96 mL/min/{1.73_m2} (ref >59)
Glucose: 91 mg/dL (ref 65–99)
PHOSPHORUS: 3.8 mg/dL (ref 2.5–4.5)
Potassium: 3.8 mmol/L (ref 3.5–5.2)
Sodium: 140 mmol/L (ref 134–144)

## 2016-01-19 LAB — H. PYLORI ANTIBODY, IGG: H PYLORI IGG: 0.9 U/mL — AB (ref 0.0–0.8)

## 2016-01-20 ENCOUNTER — Other Ambulatory Visit: Payer: Self-pay

## 2016-01-20 DIAGNOSIS — R768 Other specified abnormal immunological findings in serum: Secondary | ICD-10-CM

## 2016-01-20 MED ORDER — AMOXICILLIN 500 MG PO CAPS: 500.0000 mg | ORAL_CAPSULE | Freq: Two times a day (BID) | ORAL | 0 refills | Status: DC

## 2016-01-20 MED ORDER — CLARITHROMYCIN 500 MG PO TABS: 500.0000 mg | ORAL_TABLET | Freq: Two times a day (BID) | ORAL | 0 refills | Status: DC

## 2016-01-22 ENCOUNTER — Other Ambulatory Visit: Payer: Self-pay

## 2016-02-15 ENCOUNTER — Other Ambulatory Visit: Payer: Self-pay

## 2016-02-15 MED ORDER — SILDENAFIL CITRATE 100 MG PO TABS: 50.0000 mg | ORAL_TABLET | Freq: Every day | ORAL | 0 refills | Status: DC | PRN

## 2016-02-15 NOTE — Progress Notes (Signed)
viagra

## 2016-02-21 ENCOUNTER — Ambulatory Visit
Admission: EM | Admit: 2016-02-21 | Discharge: 2016-02-21 | Disposition: A | Discharge status: Home / Self Care | Payer: 59 | Attending: Family Medicine | Admitting: Family Medicine

## 2016-02-21 DIAGNOSIS — R109 Unspecified abdominal pain: Secondary | ICD-10-CM

## 2016-02-21 DIAGNOSIS — R350 Frequency of micturition: Secondary | ICD-10-CM | POA: Diagnosis not present

## 2016-02-21 LAB — URINALYSIS COMPLETE WITH MICROSCOPIC (ARMC ONLY)
BACTERIA UA: NONE SEEN
Bilirubin Urine: NEGATIVE
GLUCOSE, UA: NEGATIVE mg/dL
Ketones, ur: NEGATIVE mg/dL
Leukocytes, UA: NEGATIVE
NITRITE: NEGATIVE
Protein, ur: NEGATIVE mg/dL
SPECIFIC GRAVITY, URINE: 1.02 (ref 1.005–1.030)
pH: 5 (ref 5.0–8.0)

## 2016-02-21 MED ORDER — HYDROCODONE-ACETAMINOPHEN 5-325 MG PO TABS: ORAL_TABLET | ORAL | 0 refills | Status: DC

## 2016-02-21 NOTE — ED Triage Notes (Signed)
Patient complains of right flank pain and urinary frequency. Patient states that he is having to go every 20 minutes. Patient states that he has also noticed a foul odor with urination.

## 2016-02-21 NOTE — ED Provider Notes (Signed)
[facility]    CSN: [id number] Arrival date & time: 02/21/16  4098  First Provider Contact:  None       History   Chief Complaint Chief Complaint  Patient presents with  . Urinary Frequency    HPI Curtis Harper is a 45 y.o. male.   The history is provided by the patient.  Urinary Frequency   Patient complains of right flank pain and urinary frequency. Patient states that he is having to go every 20 minutes. Patient states that he has also noticed a foul odor with urination. Denies any fevers, chills, vomiting, hematuria.   Past Medical History:  Diagnosis Date  . Abscess   . Hypertension   . Migraine   . Urinary retention     Patient Active Problem List   Diagnosis Date Noted  . Perirectal abscess 11/12/2015  . Perianal abscess     Past Surgical History:  Procedure Laterality Date  . ABSCESS DRAINAGE    . CHOLECYSTECTOMY    . INCISION AND DRAINAGE ABSCESS N/A 11/12/2015   Procedure: INCISION AND DRAINAGE ABSCESS;  Surgeon: Leafy Ro, MD;  Location: [facility] ORS;  Service: General;  Laterality: N/A;  . ROTATOR CUFF REPAIR         Home Medications    Prior to Admission medications   Medication Sig Start Date End Date Taking? Authorizing Provider  metoprolol succinate (TOPROL-XL) 25 MG 24 hr tablet Take 1 tablet (25 mg total) by mouth daily. 01/18/16  Yes Duanne Limerick, MD  pantoprazole (PROTONIX) 40 MG tablet Take 1 tablet (40 mg total) by mouth daily. 01/18/16  Yes Duanne Limerick, MD  sildenafil (VIAGRA) 100 MG tablet Take 0.5-1 tablets (50-100 mg total) by mouth daily as needed for erectile dysfunction. 02/15/16  Yes Duanne Limerick, MD  valsartan-hydrochlorothiazide (DIOVAN-HCT) 320-25 MG tablet Take 1 tablet by mouth 1 day or 1 dose. 01/18/16  Yes Duanne Limerick, MD  amoxicillin (AMOXIL) 500 MG capsule Take 1 capsule (500 mg total) by mouth 2 (two) times daily. 01/20/16   Duanne Limerick, MD  clarithromycin (BIAXIN) 500 MG tablet Take 1 tablet (500  mg total) by mouth 2 (two) times daily. 01/20/16   Duanne Limerick, MD  HYDROcodone-acetaminophen (NORCO/VICODIN) 5-325 MG tablet 1-2 tabs po bid prn 02/21/16   Payton Mccallum, MD  indomethacin (INDOCIN) 50 MG capsule Take 50 mg by mouth 2 (two) times daily with a meal.    Historical Provider, MD  indomethacin (INDOCIN) 50 MG capsule Take 1 capsule (50 mg total) by mouth 2 (two) times daily with a meal. 01/18/16   Duanne Limerick, MD    Family History Family History  Problem Relation Age of Onset  . Cancer Father     Social History Social History  Substance Use Topics  . Smoking status: Former Games developer  . Smokeless tobacco: Never Used  . Alcohol use Yes     Comment: Occasional     Allergies   Lisinopril   Review of Systems Review of Systems  Genitourinary: Positive for frequency.     Physical Exam Triage Vital Signs ED Triage Vitals  Enc Vitals Group     BP 02/21/16 0941 137/86     Pulse Rate 02/21/16 0941 77     Resp 02/21/16 0941 16     Temp 02/21/16 0941 97.8 F (36.6 C)     Temp Source 02/21/16 0941 Oral     SpO2 02/21/16 0941 97 %  Weight 02/21/16 0939 265 lb (120.2 kg)     Height 02/21/16 0939 5\' 11"  (1.803 m)     Head Circumference --      Peak Flow --      Pain Score 02/21/16 0941 5     Pain Loc --      Pain Edu? --      Excl. in GC? --    No data found.   Updated Vital Signs BP 137/86 (BP Location: Left Arm)   Pulse 77   Temp 97.8 F (36.6 C) (Oral)   Resp 16   Ht 5\' 11"  (1.803 m)   Wt 265 lb (120.2 kg)   SpO2 97%   BMI 36.96 kg/m   Visual Acuity Right Eye Distance:   Left Eye Distance:   Bilateral Distance:    Right Eye Near:   Left Eye Near:    Bilateral Near:     Physical Exam  Constitutional: He appears well-developed and well-nourished. No distress.  Neck: Normal range of motion. Neck supple. No tracheal deviation present.  Pulmonary/Chest: Effort normal. No stridor. No respiratory distress.  Abdominal: Soft. Bowel sounds are  normal. He exhibits no distension and no mass. There is tenderness (mild right flank; no rebound or guarding). There is no rebound and no guarding. No hernia.  Musculoskeletal:       Cervical back: He exhibits normal range of motion, no tenderness, no bony tenderness, no swelling, no edema, no deformity, no laceration, no pain and normal pulse.       Lumbar back: He exhibits normal range of motion, no tenderness, no bony tenderness, no swelling, no edema, no deformity, no laceration, no pain, no spasm and normal pulse.  Neurological: He is alert. He has normal reflexes. He displays normal reflexes. He exhibits normal muscle tone. Coordination normal.  Skin: No rash noted. He is not diaphoretic.  Nursing note and vitals reviewed.    UC Treatments / Results  Labs (all labs ordered are listed, but only abnormal results are displayed) Labs Reviewed  URINALYSIS COMPLETEWITH MICROSCOPIC ([facility] ONLY) - Abnormal; Notable for the following:       Result Value   Hgb urine dipstick SMALL (*)    Squamous Epithelial / LPF 0-5 (*)    All other components within normal limits    EKG  EKG Interpretation None       Radiology No results found.  Procedures Procedures (including critical care time)  Medications Ordered in UC Medications - No data to display   Initial Impression / Assessment and Plan / UC Course  I have reviewed the triage vital signs and the nursing notes.  Pertinent labs & imaging results that were available during my care of the patient were reviewed by me and considered in my medical decision making (see chart for details).  Clinical Course      Final Clinical Impressions(s) / UC Diagnoses   Final diagnoses:  Urinary frequency  Flank pain  (possible kidney stone)   New Prescriptions Discharge Medication List as of 02/21/2016 10:21 AM    START taking these medications   Details  HYDROcodone-acetaminophen (NORCO/VICODIN) 5-325 MG tablet 1-2 tabs po bid prn,  Print       1. Lab (UA) results and diagnosis reviewed with patient 2. rx as per orders above; reviewed possible side effects, interactions, risks and benefits  3. Recommend supportive treatment with increased water intake 4. Follow-up prn if symptoms worsen or don't improve   Payton Mccallumrlando Palmira Stickle, MD 02/21/16  1300  

## 2016-03-23 ENCOUNTER — Encounter: Payer: Self-pay | Admitting: Family Medicine

## 2016-03-23 ENCOUNTER — Ambulatory Visit (INDEPENDENT_AMBULATORY_CARE_PROVIDER_SITE_OTHER): Payer: 59 | Admitting: Family Medicine

## 2016-03-23 VITALS — BP 122/78 | HR 82 | Temp 97.9°F | Ht 70.75 in | Wt 262.5 lb

## 2016-03-23 DIAGNOSIS — I10 Essential (primary) hypertension: Secondary | ICD-10-CM | POA: Diagnosis not present

## 2016-03-23 DIAGNOSIS — K219 Gastro-esophageal reflux disease without esophagitis: Secondary | ICD-10-CM

## 2016-03-23 DIAGNOSIS — Z23 Encounter for immunization: Secondary | ICD-10-CM

## 2016-03-23 DIAGNOSIS — R1013 Epigastric pain: Secondary | ICD-10-CM

## 2016-03-23 DIAGNOSIS — Z125 Encounter for screening for malignant neoplasm of prostate: Secondary | ICD-10-CM

## 2016-03-23 DIAGNOSIS — K5900 Constipation, unspecified: Secondary | ICD-10-CM

## 2016-03-23 DIAGNOSIS — Z13220 Encounter for screening for lipoid disorders: Secondary | ICD-10-CM | POA: Diagnosis not present

## 2016-03-23 DIAGNOSIS — E669 Obesity, unspecified: Secondary | ICD-10-CM | POA: Diagnosis not present

## 2016-03-23 DIAGNOSIS — K625 Hemorrhage of anus and rectum: Secondary | ICD-10-CM | POA: Diagnosis not present

## 2016-03-23 DIAGNOSIS — N529 Male erectile dysfunction, unspecified: Secondary | ICD-10-CM

## 2016-03-23 LAB — LIPID PANEL
Cholesterol: 202 mg/dL — ABNORMAL HIGH (ref 0–200)
HDL: 52.6 mg/dL
LDL Cholesterol: 117 mg/dL — ABNORMAL HIGH (ref 0–99)
NonHDL: 149.86
Total CHOL/HDL Ratio: 4
Triglycerides: 162 mg/dL — ABNORMAL HIGH (ref 0.0–149.0)
VLDL: 32.4 mg/dL (ref 0.0–40.0)

## 2016-03-23 LAB — CBC WITH DIFFERENTIAL/PLATELET
BASOS PCT: 0.7 % (ref 0.0–3.0)
Basophils Absolute: 0.1 10*3/uL (ref 0.0–0.1)
EOS PCT: 6.3 % — AB (ref 0.0–5.0)
Eosinophils Absolute: 0.5 10*3/uL (ref 0.0–0.7)
HCT: 40 % (ref 39.0–52.0)
HEMOGLOBIN: 13.6 g/dL (ref 13.0–17.0)
LYMPHS ABS: 2.8 10*3/uL (ref 0.7–4.0)
Lymphocytes Relative: 36.3 % (ref 12.0–46.0)
MCHC: 34.1 g/dL (ref 30.0–36.0)
MCV: 84.8 fl (ref 78.0–100.0)
MONO ABS: 0.5 10*3/uL (ref 0.1–1.0)
MONOS PCT: 6.8 % (ref 3.0–12.0)
NEUTROS PCT: 49.9 % (ref 43.0–77.0)
Neutro Abs: 3.9 10*3/uL (ref 1.4–7.7)
Platelets: 333 10*3/uL (ref 150.0–400.0)
RBC: 4.71 Mil/uL (ref 4.22–5.81)
RDW: 12.9 % (ref 11.5–15.5)
WBC: 7.7 10*3/uL (ref 4.0–10.5)

## 2016-03-23 LAB — COMPREHENSIVE METABOLIC PANEL
ALBUMIN: 4.4 g/dL (ref 3.5–5.2)
ALK PHOS: 52 U/L (ref 39–117)
ALT: 17 U/L (ref 0–53)
AST: 15 U/L (ref 0–37)
BILIRUBIN TOTAL: 0.4 mg/dL (ref 0.2–1.2)
BUN: 12 mg/dL (ref 6–23)
CO2: 31 mEq/L (ref 19–32)
CREATININE: 0.78 mg/dL (ref 0.40–1.50)
Calcium: 9.6 mg/dL (ref 8.4–10.5)
Chloride: 99 mEq/L (ref 96–112)
GFR: 137.03 mL/min (ref >60.00)
GLUCOSE: 85 mg/dL (ref 70–99)
Potassium: 3.5 mEq/L (ref 3.5–5.1)
SODIUM: 138 meq/L (ref 135–145)
TOTAL PROTEIN: 7.7 g/dL (ref 6.0–8.3)

## 2016-03-23 LAB — PSA: PSA: 1.13 ng/mL (ref 0.10–4.00)

## 2016-03-23 LAB — HEMOGLOBIN A1C: Hgb A1c MFr Bld: 6.1 % (ref 4.6–6.5)

## 2016-03-23 LAB — LIPASE: Lipase: 47 U/L (ref 11.0–59.0)

## 2016-03-23 MED ORDER — PANTOPRAZOLE SODIUM 40 MG PO TBEC
40.0000 mg | DELAYED_RELEASE_TABLET | Freq: Two times a day (BID) | ORAL | 0 refills | Status: DC
Start: 2016-03-23 — End: 2016-06-29

## 2016-03-23 NOTE — Patient Instructions (Signed)
I have increased the protonix. Take it twice daily.  Miralax (17 g) twice daily.  We will call with your lab results and with the referral to see GI.  Follow up in 6 months or sooner if needed (pending your labs).  Take care  Dr. Adriana Simasook

## 2016-03-23 NOTE — Progress Notes (Signed)
Subjective:  Patient ID: Curtis Harper, male    DOB: 08-May-1971  Age: 45 y.o. MRN: [id number]  CC: Abdominal pain, Constipation, Blood in stool/undergarmets  HPI Curtis Harper is a 45 y.o. male presents to the clinic today with the above complaints. Issues/concerns are below.  Abdominal pain, Constipation, Rectal bleeding  Over the past few weeks patient has been experiencing epigastric pain.  Initial symptom was belching, reflux and epigastric pain.  He has recently had constipation and has noticed some blood in his stool as well on his underwear.  Patient states that he's had some bright red blood with passing of stool intermittently.  Additionally, patient states that he's had several occurrences where his underwear was wet (he thought from sweating at work). When he examined it he noticed some evidence of blood.  No reports of weight loss.  No known exacerbating or relieving factors. He's had no improvement in his symptoms with daily Protonix.  Of note, patient does have a history of perirectal abscess. No current concerns about this.  Also of note, patient was recently treated for H pylori (and had diarrhea from treatment). This was after IgG was mildly elevated.  HTN  Stable on Metoprolol, Valsartan/HCTZ.  ED  Stable.  In need of refill on Viagra.  PMH, Surgical Hx, Family Hx, Social History reviewed and updated as below.  Past Medical History:  Diagnosis Date  . Abscess    Peri-rectal abscess  . Chicken pox   . ED (erectile dysfunction)   . GERD (gastroesophageal reflux disease)   . Hyperlipidemia   . Hypertension   . Migraine   . Urinary retention    Past Surgical History:  Procedure Laterality Date  . ABSCESS DRAINAGE    . BACK SURGERY  1999   L-5  . CHOLECYSTECTOMY    . INCISION AND DRAINAGE ABSCESS N/A 11/12/2015   Procedure: INCISION AND DRAINAGE ABSCESS;  Surgeon: Leafy Ro, MD;  Location: [facility] ORS;  Service: General;  Laterality:  N/A;  . ROTATOR CUFF REPAIR    . TONSILLECTOMY AND ADENOIDECTOMY     Family History  Problem Relation Age of Onset  . Cancer Father   . Hypertension Mother   . Prostate cancer Maternal Grandfather    Social History  Substance Use Topics  . Smoking status: Former Games developer  . Smokeless tobacco: Never Used  . Alcohol use Yes     Comment: Occasional   Review of Systems  Gastrointestinal: Positive for abdominal pain, blood in stool, constipation and nausea.  Genitourinary: Positive for frequency.       ED.  Musculoskeletal: Positive for joint swelling.  All other systems reviewed and are negative.  Objective:   Today's Vitals: BP 122/78 (BP Location: Right Arm, Patient Position: Sitting, Cuff Size: Large)   Pulse 82   Temp 97.9 F (36.6 C) (Oral)   Ht 5' 10.75" (1.797 m)   Wt 262 lb 8 oz (119.1 kg)   SpO2 95%   BMI 36.87 kg/m   Physical Exam  Constitutional: He is oriented to person, place, and time. He appears well-developed and well-nourished. No distress.  HENT:  Head: Normocephalic and atraumatic.  Nose: Nose normal.  Mouth/Throat: Oropharynx is clear and moist. No oropharyngeal exudate.  Normal TM's bilaterally.   Eyes: Conjunctivae are normal. No scleral icterus.  Neck: Neck supple. No thyromegaly present.  Cardiovascular: Normal rate and regular rhythm.   No murmur heard. Pulmonary/Chest: Effort normal and breath sounds normal. He has no wheezes.  He has no rales.  Abdominal: Soft. He exhibits no distension.  Mild epigastric tenderness.  Genitourinary: Rectum normal. Rectal exam shows no external hemorrhoid and guaiac negative stool.  Musculoskeletal: Normal range of motion. He exhibits no edema.  Lymphadenopathy:    He has no cervical adenopathy.  Neurological: He is alert and oriented to person, place, and time.  Skin: Skin is warm and dry. No rash noted.  Psychiatric: He has a normal mood and affect.  Vitals reviewed.  Assessment & Plan:   Problem List  Items Addressed This Visit    Abdominal pain - Primary    New problem. Suspect GERD vs Gastritis vs PUD. Increasing Protonix to 40 BID.      Relevant Orders   Ambulatory referral to Gastroenterology   Constipation    New problem. Advised BID Miralax.      Relevant Orders   Ambulatory referral to Gastroenterology   Rectal bleeding    New problem. Hemoccult negative today. Given other issues and age, sending to GI for colonoscopy. Labs today.      Relevant Orders   Ambulatory referral to Gastroenterology   Essential hypertension    Stable on Metoprolol and Valsartan/HCTZ.      Relevant Medications   sildenafil (VIAGRA) 100 MG tablet   Erectile dysfunction    Stable. Refilling Viagra.       Other Visit Diagnoses    Gastroesophageal reflux disease, esophagitis presence not specified       Relevant Medications   pantoprazole (PROTONIX) 40 MG tablet       Outpatient Encounter Prescriptions as of 03/23/2016  Medication Sig  . metoprolol succinate (TOPROL-XL) 25 MG 24 hr tablet Take 1 tablet (25 mg total) by mouth daily.  . pantoprazole (PROTONIX) 40 MG tablet Take 1 tablet (40 mg total) by mouth 2 (two) times daily before a meal.  . sildenafil (VIAGRA) 100 MG tablet Take 0.5-1 tablets (50-100 mg total) by mouth daily as needed for erectile dysfunction.  . valsartan-hydrochlorothiazide (DIOVAN-HCT) 320-25 MG tablet Take 1 tablet by mouth 1 day or 1 dose.  . [DISCONTINUED] pantoprazole (PROTONIX) 40 MG tablet Take 1 tablet (40 mg total) by mouth daily.  . [DISCONTINUED] sildenafil (VIAGRA) 100 MG tablet Take 0.5-1 tablets (50-100 mg total) by mouth daily as needed for erectile dysfunction.  . [DISCONTINUED] amoxicillin (AMOXIL) 500 MG capsule Take 1 capsule (500 mg total) by mouth 2 (two) times daily.  . [DISCONTINUED] clarithromycin (BIAXIN) 500 MG tablet Take 1 tablet (500 mg total) by mouth 2 (two) times daily.  . [DISCONTINUED] HYDROcodone-acetaminophen (NORCO/VICODIN)  5-325 MG tablet 1-2 tabs po bid prn  . [DISCONTINUED] indomethacin (INDOCIN) 50 MG capsule Take 50 mg by mouth 2 (two) times daily with a meal.  . [DISCONTINUED] indomethacin (INDOCIN) 50 MG capsule Take 1 capsule (50 mg total) by mouth 2 (two) times daily with a meal.   No facility-administered encounter medications on file as of 03/23/2016.     Follow-up: 6 months (sooner if needed).  Everlene OtherJayce Beryle Bagsby DO [facility] [place]

## 2016-03-23 NOTE — Progress Notes (Signed)
Pre visit review using our clinic review tool, if applicable. No additional management support is needed unless otherwise documented below in the visit note. 

## 2016-03-24 DIAGNOSIS — N529 Male erectile dysfunction, unspecified: Secondary | ICD-10-CM | POA: Insufficient documentation

## 2016-03-24 DIAGNOSIS — K625 Hemorrhage of anus and rectum: Secondary | ICD-10-CM | POA: Insufficient documentation

## 2016-03-24 DIAGNOSIS — K5900 Constipation, unspecified: Secondary | ICD-10-CM | POA: Insufficient documentation

## 2016-03-24 DIAGNOSIS — R109 Unspecified abdominal pain: Secondary | ICD-10-CM | POA: Insufficient documentation

## 2016-03-24 DIAGNOSIS — I10 Essential (primary) hypertension: Secondary | ICD-10-CM | POA: Insufficient documentation

## 2016-03-24 MED ORDER — SILDENAFIL CITRATE 100 MG PO TABS: 50.0000 mg | ORAL_TABLET | Freq: Every day | ORAL | 0 refills | Status: DC | PRN

## 2016-03-24 NOTE — Assessment & Plan Note (Signed)
Stable. Refilling Viagra.

## 2016-03-24 NOTE — Assessment & Plan Note (Signed)
New problem. Hemoccult negative today. Given other issues and age, sending to GI for colonoscopy. Labs today.

## 2016-03-24 NOTE — Assessment & Plan Note (Signed)
New problem. Suspect GERD vs Gastritis vs PUD. Increasing Protonix to 40 BID.

## 2016-03-24 NOTE — Assessment & Plan Note (Signed)
Stable on Metoprolol and Valsartan/HCTZ.

## 2016-03-24 NOTE — Assessment & Plan Note (Signed)
New problem. Advised BID Miralax.

## 2016-03-25 ENCOUNTER — Encounter: Payer: Self-pay | Admitting: Physician Assistant

## 2016-03-29 ENCOUNTER — Other Ambulatory Visit: Payer: Self-pay | Admitting: Family Medicine

## 2016-03-29 DIAGNOSIS — E785 Hyperlipidemia, unspecified: Secondary | ICD-10-CM

## 2016-03-29 DIAGNOSIS — R7303 Prediabetes: Secondary | ICD-10-CM

## 2016-04-08 ENCOUNTER — Ambulatory Visit: Payer: 59 | Admitting: Physician Assistant

## 2016-04-20 ENCOUNTER — Ambulatory Visit: Payer: 59 | Admitting: Physician Assistant

## 2016-05-02 ENCOUNTER — Encounter: Payer: Self-pay | Admitting: Family Medicine

## 2016-05-02 ENCOUNTER — Ambulatory Visit (INDEPENDENT_AMBULATORY_CARE_PROVIDER_SITE_OTHER): Payer: 59 | Admitting: Family Medicine

## 2016-05-02 ENCOUNTER — Telehealth: Payer: Self-pay | Admitting: Family Medicine

## 2016-05-02 VITALS — BP 118/78 | HR 91 | Temp 98.7°F | Resp 14 | Wt 256.0 lb

## 2016-05-02 DIAGNOSIS — K4090 Unilateral inguinal hernia, without obstruction or gangrene, not specified as recurrent: Secondary | ICD-10-CM | POA: Diagnosis not present

## 2016-05-02 DIAGNOSIS — R1013 Epigastric pain: Secondary | ICD-10-CM | POA: Diagnosis not present

## 2016-05-02 DIAGNOSIS — K4091 Unilateral inguinal hernia, without obstruction or gangrene, recurrent: Secondary | ICD-10-CM | POA: Insufficient documentation

## 2016-05-02 NOTE — Telephone Encounter (Signed)
Pt called stating that he has a possible hernia from working the other night. I sent pt's call to [facility] Triage.  Call pt @ [phone]

## 2016-05-02 NOTE — Progress Notes (Signed)
   Subjective:  Patient ID: Curtis Harper, male    DOB: February 04, 1971  Age: 45 y.o. MRN: [id number]  CC: ? Hernia  HPI:  45 year old male with HTN presents with the above complaint/concern.  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 He was pushing and pulling the milk crates and felt something "pull". Following this, he developed pain and swelling in the left groin. He reports continued pain, swelling. No relieving factors. He states that he has had a prior hernia (and subsequent repair) in this region (he did not endorse this to me previously). No other associated symptoms. No other complaints at this time.  Social Hx   Social History   Social History  . Marital status: Divorced    Spouse name: N/A  . Number of children: N/A  . Years of education: N/A   Social History Main Topics  . Smoking status: Former Games developermoker  . Smokeless tobacco: Never Used  . Alcohol use Yes     Comment: Occasional  . Drug use: No  . Sexual activity: Yes   Other Topics Concern  . None   Social History Narrative  . None    Review of Systems  Constitutional: Negative.   Gastrointestinal: Positive for abdominal pain and nausea.       Pain in the left inguinal region and associated swelling.    Objective:  BP 118/78 (BP Location: Right Arm, Patient Position: Sitting, Cuff Size: Normal)   Pulse 91   Temp 98.7 F (37.1 C) (Oral)   Resp 14   Wt 256 lb (116.1 kg)   SpO2 97%   BMI 35.96 kg/m   BP/Weight 05/02/2016 03/23/2016 02/21/2016  Systolic BP 118 122 137  Diastolic BP 78 78 86  Wt. (Lbs) 256 262.5 265  BMI 35.96 36.87 36.96   Physical Exam  Constitutional: He is oriented to person, place, and time. He appears well-developed. No distress.  Pulmonary/Chest: Effort normal.  Genitourinary:  Genitourinary Comments: Left inguinal hernia noted on exam. Reducible.   Neurological: He is alert and oriented to person, place, and time.  Psychiatric:  Flat affect.     Vitals reviewed.   Lab Results  Component Value Date   WBC 7.7 03/23/2016   HGB 13.6 03/23/2016   HCT 40.0 03/23/2016   PLT 333.0 03/23/2016   GLUCOSE 85 03/23/2016   CHOL 202 (H) 03/23/2016   TRIG 162.0 (H) 03/23/2016   HDL 52.60 03/23/2016   LDLCALC 117 (H) 03/23/2016   ALT 17 03/23/2016   AST 15 03/23/2016   NA 138 03/23/2016   K 3.5 03/23/2016   CL 99 03/23/2016   CREATININE 0.78 03/23/2016   BUN 12 03/23/2016   CO2 31 03/23/2016   PSA 1.13 03/23/2016   HGBA1C 6.1 03/23/2016    Assessment & Plan:   Problem List Items Addressed This Visit    Left inguinal hernia - Primary    New problem. Sending to Gen surg for repair.       Relevant Orders   Ambulatory referral to General Surgery   Abdominal pain    Ongoing issue (in setting of rectal bleeding).  Has appt with GI tomorrow.         Follow-up: PRN  Everlene OtherJayce Pleas Carneal DO [facility] [place]

## 2016-05-02 NOTE — Assessment & Plan Note (Signed)
Ongoing issue (in setting of rectal bleeding).  Has appt with GI tomorrow.

## 2016-05-02 NOTE — Telephone Encounter (Signed)
Patient has an appt with PCP this afternoon. thanks

## 2016-05-02 NOTE — Patient Instructions (Signed)
We will call with the referral.  Be sure to see GI.  Take care  Dr. Adriana Simasook

## 2016-05-02 NOTE — Telephone Encounter (Signed)
Patient Name: Enos FlingODD Passe  DOB: [birth year]    Initial Comment Caller thinks he has a hernia and would like an appt to be seen. Caller states he was doing some heavy lifting and has groin pain.    Nurse Assessment      Guidelines    Guideline Title Affirmed Question Affirmed Notes       Final Disposition User   FINAL ATTEMPT MADE - message left Stefano GaulStringer, Charity fundraiserN, Dwana CurdVera

## 2016-05-02 NOTE — Assessment & Plan Note (Signed)
New problem. Sending to Gen surg for repair.

## 2016-05-02 NOTE — Progress Notes (Signed)
Pre visit review using our clinic review tool, if applicable. No additional management support is needed unless otherwise documented below in the visit note. 

## 2016-05-03 ENCOUNTER — Encounter: Payer: Self-pay | Admitting: Physician Assistant

## 2016-05-03 ENCOUNTER — Ambulatory Visit (INDEPENDENT_AMBULATORY_CARE_PROVIDER_SITE_OTHER): Payer: 59 | Admitting: Physician Assistant

## 2016-05-03 VITALS — BP 120/60 | HR 65 | Ht 71.0 in | Wt 256.0 lb

## 2016-05-03 DIAGNOSIS — R634 Abnormal weight loss: Secondary | ICD-10-CM

## 2016-05-03 DIAGNOSIS — R1013 Epigastric pain: Secondary | ICD-10-CM

## 2016-05-03 DIAGNOSIS — R194 Change in bowel habit: Secondary | ICD-10-CM

## 2016-05-03 DIAGNOSIS — K611 Rectal abscess: Secondary | ICD-10-CM

## 2016-05-03 DIAGNOSIS — K625 Hemorrhage of anus and rectum: Secondary | ICD-10-CM

## 2016-05-03 MED ORDER — NA SULFATE-K SULFATE-MG SULF 17.5-3.13-1.6 GM/177ML PO SOLN: 1.0000 | Freq: Once | ORAL | 0 refills | Status: AC

## 2016-05-03 MED ORDER — RANITIDINE HCL 150 MG PO TABS: 150.0000 mg | ORAL_TABLET | Freq: Two times a day (BID) | ORAL | 6 refills | Status: DC

## 2016-05-03 NOTE — Progress Notes (Signed)
Reviewed with physician assistant. No evidence for active perirectal abscess. Agree with initial assessment and plans as outlined.

## 2016-05-03 NOTE — Patient Instructions (Addendum)
We sent a prescription to [facility].  Zantac  Suprep for the colonoscopy.  You have been scheduled for an endoscopy and colonoscopy. Please follow the written instructions given to you at your visit today. Please pick up your prep supplies at the pharmacy within the next 1-3 days. If you use inhalers (even only as needed), please bring them with you on the day of your procedure.

## 2016-05-03 NOTE — Progress Notes (Signed)
Chief Complaint: Epigastric Pain, Rectal Bleeding, Change in bowel habits, Rectal bleeding  HPI:  Mr. Curtis Harper is a 45 year old Caucasian male with a past medical history of perirectal abscess, GERD, hyperlipidemia, hypertension and urinary retention who was referred to me by Tommie Samsook, Jayce G, DO for a complaint of epigastric pain, rectal bleeding and constipation.   Per chart review patient has been to see his primary care physician regarding constipation and rectal bleeding recently as well as continuous complaints of epigastric pain. Apparently the patient's Protonix was increased to 40 mg twice a day and the patient was advised to use twice a day MiraLAX. CMP and CBC were completed 03/23/2016 and were both within normal limits.   Today, the patient tells me that for the past 3 months or so he has been experiencing a change in bowel habits towards constipation. He describes that he will not have daily regular bowel movements anymore and when he does have a BM it is often hard to pass. He also describes days where he will wake up and have diarrhea in the morning. 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 Patient does describe that he is also seeing some bright red blood in his underwear and on the toilet paper when wiping, this has been occurring over the past couple months with the most recent episode a few days ago. He does have a history of perirectal abscesses the last one identified on the CT earlier this year. He has not had a colonoscopy since his first perirectal abscess diagnosed 2013. Since that time he has had 3 different episodes. Associated symptoms included a weight loss of around 25 pounds in the past 3 months. The patient has been trying to lose weight but this is faster than he has ever lost weight before.    Patient also describes today that he has reflux which is  uncontrolled regardless of Pantoprazole 40 mg which was recently increased to twice daily dosing about a month ago. Previous to this the patient was on Omeprazole 40 mg daily which helped for "years", until about a month ago. Associated symptoms include nausea and an increase in eructations. He also describes an epigastric pain along with his reflux symptoms which he rates as a 8-9/10 at times. Patient is aware of foods that he should not eat and tries to avoid these.   Recent medical history includes a diagnosis of inguinal hernia earlier this week for which the patient is scheduled to see surgery. This was easily reducible at time of exam with his PCP earlier this week.   Patient denies fever, chills, melena, recent change in medications, travel, sick contacts, fatigue, anorexia, vomiting, dysphagia or symptoms that awaken him at night.  Past Medical History:  Diagnosis Date  . Abscess    Peri-rectal abscess  . Chicken pox   . ED (erectile dysfunction)   . GERD (gastroesophageal reflux disease)   . Hyperlipidemia   . Hypertension   . Migraine   . Urinary retention     Past Surgical History:  Procedure Laterality Date  . ABSCESS DRAINAGE    . BACK SURGERY  1999   L-5  . CHOLECYSTECTOMY    . INCISION AND DRAINAGE ABSCESS N/A 11/12/2015   Procedure: INCISION AND DRAINAGE ABSCESS;  Surgeon: Leafy Roiego F Pabon, MD;  Location: [facility] ORS;  Service: General;  Laterality: N/A;  .  ROTATOR CUFF REPAIR    . TONSILLECTOMY AND ADENOIDECTOMY      Current Outpatient Prescriptions  Medication Sig Dispense Refill  . metoprolol succinate (TOPROL-XL) 25 MG 24 hr tablet Take 1 tablet (25 mg total) by mouth daily. 90 tablet 1  . pantoprazole (PROTONIX) 40 MG tablet Take 1 tablet (40 mg total) by mouth 2 (two) times daily before a meal. 180 tablet 0  . sildenafil (VIAGRA) 100 MG tablet Take 0.5-1 tablets (50-100 mg total) by mouth daily as needed for erectile dysfunction. 10 tablet 0  .  valsartan-hydrochlorothiazide (DIOVAN-HCT) 320-25 MG tablet Take 1 tablet by mouth 1 day or 1 dose. 90 tablet 1   No current facility-administered medications for this visit.     Allergies as of 05/03/2016 - Review Complete 05/02/2016  Allergen Reaction Noted  . Lisinopril Cough 11/12/2015    Family History  Problem Relation Age of Onset  . Cancer Father   . Hypertension Mother   . Prostate cancer Maternal Grandfather     Social History   Social History  . Marital status: Divorced    Spouse name: N/A  . Number of children: N/A  . Years of education: N/A   Occupational History  . Not on file.   Social History Main Topics  . Smoking status: Former Games developer  . Smokeless tobacco: Never Used  . Alcohol use Yes     Comment: Occasional  . Drug use: No  . Sexual activity: Yes   Other Topics Concern  . Not on file   Social History Narrative  . No narrative on file    Review of Systems:     Constitutional: Positive for weight loss of 25 pounds in 3 months No fever, chills, weakness or fatigue HEENT: Eyes: No change in vision               Ears, Nose, Throat:  No change in hearing or congestion Skin: No rash or itching Cardiovascular: No chest pain, chest pressure or palpitations   Respiratory: No SOB or cough Gastrointestinal: See HPI and otherwise negative Genitourinary: No dysuria or change in urinary frequency Neurological: No headache, dizziness or syncope Musculoskeletal: No new muscle or joint pain Hematologic: No bruising Psychiatric: No history of depression or anxiety   Physical Exam:  Vital signs: BP 120/60   Pulse 65   Ht 5\' 11"  (1.803 m)   Wt 256 lb (116.1 kg)   BMI 35.70 kg/m    General:   Pleasant overweight African-American male appears to be in NAD, Well developed, Well nourished, alert and cooperative Head:  Normocephalic and atraumatic. Eyes:   PEERL, EOMI. No icterus. Conjunctiva pink. Ears:  Normal auditory acuity. Neck:  Supple Throat:  Oral cavity and pharynx without inflammation, swelling or lesion.  Lungs: Respirations even and unlabored. Lungs clear to auscultation bilaterally.   No wheezes, crackles, or rhonchi.  Heart: Normal S1, S2. No MRG. Regular rate and rhythm. No peripheral edema, cyanosis or pallor.  Abdomen:  Soft, nondistended, mild epigastric ttp No rebound or guarding. Normal bowel sounds. No appreciable masses or hepatomegaly. Rectal:  Not performed today (rectal exam on 03/23/16 by PCP was normal with guaiac neg stool) Msk:  Symmetrical without gross deformities. Peripheral pulses intact.  Extremities:  Without edema, no deformity or joint abnormality. Normal ROM. Neurologic:  Alert and  oriented x4;  grossly normal neurologically. Skin:   Dry and intact without significant lesions or rashes. Psychiatric: Oriented to person, place and time. Demonstrates good judgement  and reason without abnormal affect or behaviors.  MOST RECENT LABS: CBC    Component Value Date/Time   WBC 7.7 03/23/2016 1513   RBC 4.71 03/23/2016 1513   HGB 13.6 03/23/2016 1513   HCT 40.0 03/23/2016 1513   PLT 333.0 03/23/2016 1513   MCV 84.8 03/23/2016 1513   MCH 29.6 11/15/2015 0533   MCHC 34.1 03/23/2016 1513   RDW 12.9 03/23/2016 1513   LYMPHSABS 2.8 03/23/2016 1513   MONOABS 0.5 03/23/2016 1513   EOSABS 0.5 03/23/2016 1513   BASOSABS 0.1 03/23/2016 1513    CMP     Component Value Date/Time   NA 138 03/23/2016 1513   NA 140 01/18/2016 1604   K 3.5 03/23/2016 1513   CL 99 03/23/2016 1513   CO2 31 03/23/2016 1513   GLUCOSE 85 03/23/2016 1513   BUN 12 03/23/2016 1513   BUN 11 01/18/2016 1604   CREATININE 0.78 03/23/2016 1513   CALCIUM 9.6 03/23/2016 1513   PROT 7.7 03/23/2016 1513   ALBUMIN 4.4 03/23/2016 1513   ALBUMIN 4.7 01/18/2016 1604   AST 15 03/23/2016 1513   ALT 17 03/23/2016 1513   ALKPHOS 52 03/23/2016 1513   BILITOT 0.4 03/23/2016 1513   GFRNONAA 96 01/18/2016 1604   GFRAA 111 01/18/2016 1604    EXAM: CT ABDOMEN AND PELVIS WITH CONTRAST 11/12/15  TECHNIQUE: Multidetector CT imaging of the abdomen and pelvis was performed using the standard protocol following bolus administration of intravenous contrast.  CONTRAST:  ISOVUE-300 IOPAMIDOL (ISOVUE-300) INJECTION 61%  COMPARISON:  None.  FINDINGS: Lower chest:  Normal  Hepatobiliary: Status post cholecystectomy.  Liver is normal.  Pancreas: Normal  Spleen: Normal  Adrenals/Urinary Tract: Adrenal glands are normal. Kidneys are normal. No stones. No hydronephrosis. Bladder is normal.  Stomach/Bowel: Nonobstructive bowel gas pattern. Appendix, large bowel, small bowel, and stomach are normal.  Vascular/Lymphatic: No vascular abnormalities. Small retroperitoneal lymph nodes nonpathologic by size  Reproductive: Fat containing left inguinal hernia.  Other: No ascites. Perianal rim enhancing low-attenuation structure in the left perineum measures 54 x 26 mm. It demonstrates average attenuation value of 28. There is mild surrounding inflammation. There is a second adjacent loculation immediately posterior to the anus measuring about 17 mm.  Musculoskeletal: No acute findings  IMPRESSION: 5.4 cm perianal abscess extending from the posterior inferior edge of the prostate posteriorly 5.4 cm in the left perineum. There is an adjacent 16 mm abscess in the midline posteriorly.   Electronically Signed   By: Esperanza Heir M.D.   On: 11/12/2015 19:21  Assessment: 1. Rectal Bleeding: Patient describes bright red blood in his underwear during the day as well as bright red blood on the toilet paper after wiping, associated with recent change in bowel habits towards constipation with some diarrhea; likely hemorrhoidal versus other bleeding 2. Change in bowel habits: Patient tells me that typically he battles constipation, he is using MiraLAX every other day, but also describes some episodes of what he  thinks is diarrhea; overflow constipation versus IBS versus other 3. H/o Perianal abscess: last identified via CT 11/12/15; see report above, has occurred 3 times over the past 4 years 4. Weight loss: 25 pounds per patient report, patient has been trying to lose weight but this is "faster than I have ever lost weight before" 5. Epigastric pain: Patient describes an epigastric pain rated as an 8-9/10, associated with reflux and occasional nausea symptoms, unchanged with Pantoprazole 40 mg twice a day; consider gastritis versus H. pylori  versus PUD versus other  Plan: 1. Recommend patient proceed with EGD and Colonoscopy. Discussed risks, benefits, limitations and alternatives and the patient agrees to proceed. This will be scheduled in the [facility]. 2. Continue Protonix 40 mg twice a day, prescribed Zantac 150 mg twice a day to be added to patient's current regimen 3. Antireflux diet and lifestyle modifications were discussed and the patient was provided with a handout. 4. Requested patient's past EGD and colonoscopy from [place], apparently these were 10 years ago 5. Patient to follow in clinic per Dr. Lamar SprinklesPerry's recommendations after time of procedures.  Hyacinth MeekerJennifer Xinyi Batton, PA-C [facility] 05/03/2016, 1:44 PM  Cc: Tommie Samsook, Jayce G, DO

## 2016-05-04 ENCOUNTER — Ambulatory Visit (INDEPENDENT_AMBULATORY_CARE_PROVIDER_SITE_OTHER): Payer: Worker's Compensation | Admitting: Surgery

## 2016-05-04 ENCOUNTER — Encounter: Payer: Self-pay | Admitting: Surgery

## 2016-05-04 VITALS — BP 133/80 | HR 79 | Temp 98.0°F | Ht 70.0 in | Wt 258.0 lb

## 2016-05-04 DIAGNOSIS — K4090 Unilateral inguinal hernia, without obstruction or gangrene, not specified as recurrent: Secondary | ICD-10-CM | POA: Diagnosis not present

## 2016-05-04 NOTE — Progress Notes (Signed)
05/04/2016  Reason for Visit:  Left inguinal hernia  History of Present Illness: Curtis Harper is a 45 y.o. male who presents with a left inguinal hernia.  The patient reports that last week on 11/10 he was doing heavy lifting and pushing while at work and noted a pulling feeling over his left groin.  He felt initially that it was a pulled muscle and went to work again on 11/11, but his pain has progressively worsened and also has noted swelling that did not go away.  He saw his PCP on 11/13 and was referred to us for evaluation.    He had been admitted in May 2017 with a peri-rectal abscess that required drainage in the OR and had a CT scan that showed a left fat containing inguinal hernia.  However, the patient denies feeling any bulging at that point until last week. He reports having had an open left inguinal hernia repair in 2003 in [place].  Otherwise, denies having any fevers, chills, chest pain, shortness of breath, nausea, vomiting, other abdominal pain, diarrhea, or constipation.  He has had some blood in the stools and is getting a colonoscopy and EGD with GI in the near future.  The bulging and discomfort are better when he lies down, but has not tried to check if the bulging disappears or is reducible.   Past Medical History: Past Medical History:  Diagnosis Date  . Abscess    Peri-rectal abscess  . Chicken pox   . ED (erectile dysfunction)   . GERD (gastroesophageal reflux disease)   . Hernia, inguinal, left   . Hyperlipidemia   . Hypertension   . Migraine   . Urinary retention      Past Surgical History: Past Surgical History:  Procedure Laterality Date  . ABSCESS DRAINAGE    . BACK SURGERY  1999   L-5  . CHOLECYSTECTOMY    . INCISION AND DRAINAGE ABSCESS N/A 11/12/2015   Procedure: INCISION AND DRAINAGE ABSCESS;  Surgeon: Leafy Roiego F Pabon, MD;  Location: [facility] ORS;  Service: General;  Laterality: N/A;  . ROTATOR CUFF REPAIR Left   . TONSILLECTOMY AND ADENOIDECTOMY       Home Medications: Prior to Admission medications   Medication Sig Start Date End Date Taking? Authorizing Provider  metoprolol succinate (TOPROL-XL) 25 MG 24 hr tablet Take 1 tablet (25 mg total) by mouth daily. 01/18/16  Yes Duanne Limerickeanna C Jones, MD  pantoprazole (PROTONIX) 40 MG tablet Take 1 tablet (40 mg total) by mouth 2 (two) times daily before a meal. 03/23/16  Yes Tommie SamsJayce G Cook, DO  ranitidine (ZANTAC) 150 MG tablet Take 1 tablet (150 mg total) by mouth 2 (two) times daily. 05/03/16  Yes Unk LightningJennifer Lynne Lemmon, PA  sildenafil (VIAGRA) 100 MG tablet Take 0.5-1 tablets (50-100 mg total) by mouth daily as needed for erectile dysfunction. 03/24/16  Yes Jayce G Cook, DO  valsartan-hydrochlorothiazide (DIOVAN-HCT) 320-25 MG tablet Take 1 tablet by mouth 1 day or 1 dose. 01/18/16  Yes Duanne Limerickeanna C Jones, MD    Allergies: Allergies  Allergen Reactions  . Lisinopril Cough    cough    Social History:  reports that he has quit smoking. His smoking use included Cigarettes. He quit after 10.00 years of use. He has never used smokeless tobacco. He reports that he drinks alcohol. He reports that he does not use drugs.   Family History: Family History  Problem Relation Age of Onset  . Stomach cancer Father  spread to lungs  . Hypertension Mother   . Prostate cancer Maternal Grandfather     Review of Systems: Review of Systems  Constitutional: Negative for chills and fever.  HENT: Negative for hearing loss.   Eyes: Negative for blurred vision.  Respiratory: Negative for cough and shortness of breath.   Cardiovascular: Negative for chest pain and leg swelling.  Gastrointestinal: Positive for abdominal pain (left inguinal discomfort with activity) and blood in stool. Negative for constipation, diarrhea, heartburn, nausea and vomiting.  Genitourinary: Negative for dysuria and hematuria.  Musculoskeletal: Negative for myalgias.  Skin: Negative for rash.  Neurological: Negative for dizziness.   Psychiatric/Behavioral: Negative for depression.  All other systems reviewed and are negative.   Physical Exam BP 133/80   Pulse 79   Temp 98 F (36.7 C) (Oral)   Ht 5\' 10"  (1.778 m)   Wt 117 kg (258 lb)   BMI 37.02 kg/m  CONSTITUTIONAL: No acute distress. HEENT:  Normocephalic, atraumatic, extraocular motion intact. NECK: Trachea is midline, and there is no jugular venous distension.  RESPIRATORY:  Lungs are clear, and breath sounds are equal bilaterally. Normal respiratory effort without pathologic use of accessory muscles. CARDIOVASCULAR: Heart is regular without murmurs, gallops, or rubs. GI: The abdomen is soft, obese, non-distended, tender to palpation over the left groin.  Patient does have a reducible left inguinal hernia.  No overlying skin discoloration or changes.  There were no palpable masses. Patient has scars from prior laparoscopic cholecystectomy and a right groin scar from an injury, all well healed.  Possible very small umbilical hernia. MUSCULOSKELETAL:  Normal muscle strength and tone in all four extremities.  No peripheral edema or cyanosis. SKIN: Skin turgor is normal. There are no pathologic skin lesions.  NEUROLOGIC:  Motor and sensation is grossly normal.  Cranial nerves are grossly intact. PSYCH:  Alert and oriented to person, place and time. Affect is normal.  Laboratory Analysis: No results found for this or any previous visit (from the past 24 hour(s)).  Imaging: No results found.  Assessment and Plan: This is a 45 y.o. male who presents with a left inguinal hernia, which is reducible, but symptomatic.  Discussed with the patient the different types of hernia and that his is reducible and there is no emergency to repair it.  Discussed the options of open versus laparoscopic approaches and that since he had an open repair in the past, I would like to approach his recurrence laparoscopically.  That way we can also repair an umbilical hernia if one is  found and we can also evaluate the right groin for any hernia and fix it if needed.  The patient agreed with a laparoscopic left inguinal hernia repair.  The risks and benefits of the procedure were discussed with the patient.  He will be scheduled for 11/28.  He understands this plan and all of his questions have been answered.   Howie IllJose Luis Rhianna Raulerson, MD [facility]

## 2016-05-04 NOTE — Patient Instructions (Signed)
We have scheduled your surgery on 05/17/16 with Dr.Piscoya. Please see the blue pre-care sheet for surgery information. Please call our office if you have any questions or concerns.

## 2016-05-06 ENCOUNTER — Telehealth: Payer: Self-pay | Admitting: Surgery

## 2016-05-06 NOTE — Telephone Encounter (Signed)
Pt advised of pre op date/time and sx date. Sx: 05/17/16 with Dr Dow AdolphLoflin--Laparoscopic left inguinal hernia repair.  Pre op: 05/11/16 between 1-5:00pm--Phone.   Patient made aware to call [phone], between 1-3:00pm the day before surgery, to find out what time to arrive.

## 2016-05-11 ENCOUNTER — Encounter
Admission: RE | Admit: 2016-05-11 | Discharge: 2016-05-11 | Disposition: A | Discharge status: Home / Self Care | Payer: Worker's Compensation | Source: Ambulatory Visit | Attending: Surgery | Admitting: Surgery

## 2016-05-11 NOTE — Patient Instructions (Signed)
  Your procedure is scheduled on: 05-17-16 (TUESDAY) Report to Same Day Surgery 2nd floor medical mall To find out your arrival time please call [phone] between 1PM - 3PM on 05-16-16 [facility](MONDAY)  Remember: Instructions that are not followed completely may result in serious medical risk, up to and including death, or upon the discretion of your surgeon and anesthesiologist your surgery may need to be rescheduled.    _x___ 1. Do not eat food or drink liquids after midnight. No gum chewing or hard candies.     __x__ 2. No Alcohol for 24 hours before or after surgery.   __x__3. No Smoking for 24 prior to surgery.   ____  4. Bring all medications with you on the day of surgery if instructed.    __x__ 5. Notify your doctor if there is any change in your medical condition     (cold, fever, infections).     Do not wear jewelry, make-up, hairpins, clips or nail polish.  Do not wear lotions, powders, or perfumes. You may wear deodorant.  Do not shave 48 hours prior to surgery. Men may shave face and neck.  Do not bring valuables to the hospital.    [facility] is not responsible for any belongings or valuables.               Contacts, dentures or bridgework may not be worn into surgery.  Leave your suitcase in the car. After surgery it may be brought to your room.  For patients admitted to the hospital, discharge time is determined by your treatment team.   Patients discharged the day of surgery will not be allowed to drive home.  You will need someone to drive you home and stay with you the night of your procedure.    Please read over the following fact sheets that you were given:   [facility] Preparing for Surgery and or MRSA Information   _x___ Take these medicines the morning of surgery with A SIP OF WATER:    1. METOPROLOL  2. PROTONIX  3.  4.  5.  6.  ____Fleets enema or Magnesium Citrate as directed.   _x___ Use CHG Soap or sage wipes as directed on instruction sheet    ____ Use inhalers on the day of surgery and bring to hospital day of surgery  ____ Stop metformin 2 days prior to surgery    ____ Take 1/2 of usual insulin dose the night before surgery and none on the morning of surgery.   ____ Stop Aspirin, Coumadin, Pllavix ,Eliquis, Effient, or Pradaxa  x__ Stop Anti-inflammatories such as Advil, Aleve, Ibuprofen, Motrin, Naproxen,          Naprosyn, Goodies powders or aspirin products. Ok to take Tylenol.   ____ Stop supplements until after surgery.    _x___ Bring C-Pap to the hospital.

## 2016-05-16 ENCOUNTER — Encounter
Admission: RE | Admit: 2016-05-16 | Discharge: 2016-05-16 | Disposition: A | Discharge status: Home / Self Care | Payer: Federal, State, Local not specified - PPO | Source: Ambulatory Visit | Attending: Surgery | Admitting: Surgery

## 2016-05-16 DIAGNOSIS — Z79899 Other long term (current) drug therapy: Secondary | ICD-10-CM | POA: Diagnosis not present

## 2016-05-16 DIAGNOSIS — E785 Hyperlipidemia, unspecified: Secondary | ICD-10-CM | POA: Diagnosis not present

## 2016-05-16 DIAGNOSIS — K219 Gastro-esophageal reflux disease without esophagitis: Secondary | ICD-10-CM | POA: Diagnosis not present

## 2016-05-16 DIAGNOSIS — I10 Essential (primary) hypertension: Secondary | ICD-10-CM | POA: Diagnosis not present

## 2016-05-16 DIAGNOSIS — Z87891 Personal history of nicotine dependence: Secondary | ICD-10-CM | POA: Diagnosis not present

## 2016-05-16 DIAGNOSIS — G4730 Sleep apnea, unspecified: Secondary | ICD-10-CM | POA: Diagnosis not present

## 2016-05-16 DIAGNOSIS — N529 Male erectile dysfunction, unspecified: Secondary | ICD-10-CM | POA: Diagnosis not present

## 2016-05-16 DIAGNOSIS — Z9989 Dependence on other enabling machines and devices: Secondary | ICD-10-CM | POA: Diagnosis not present

## 2016-05-16 DIAGNOSIS — K4090 Unilateral inguinal hernia, without obstruction or gangrene, not specified as recurrent: Secondary | ICD-10-CM | POA: Diagnosis not present

## 2016-05-16 LAB — POTASSIUM: POTASSIUM: 3.5 mmol/L (ref 3.5–5.1)

## 2016-05-16 MED ORDER — CEFAZOLIN SODIUM-DEXTROSE 2-4 GM/100ML-% IV SOLN
2.0000 g | INTRAVENOUS | Status: AC
  Administered 2016-05-17: 2 g via INTRAVENOUS

## 2016-05-17 ENCOUNTER — Encounter: Payer: Self-pay | Admitting: *Deleted

## 2016-05-17 ENCOUNTER — Telehealth: Payer: Self-pay

## 2016-05-17 ENCOUNTER — Ambulatory Visit
Admission: RE | Admit: 2016-05-17 | Discharge: 2016-05-17 | Disposition: A | Discharge status: Home / Self Care | Payer: Federal, State, Local not specified - PPO | Source: Ambulatory Visit | Attending: Surgery | Admitting: Surgery

## 2016-05-17 ENCOUNTER — Encounter
Admission: RE | Disposition: A | Discharge status: Home / Self Care | Payer: Self-pay | Source: Ambulatory Visit | Attending: Surgery

## 2016-05-17 ENCOUNTER — Ambulatory Visit: Payer: Federal, State, Local not specified - PPO | Admitting: Anesthesiology

## 2016-05-17 ENCOUNTER — Ambulatory Visit: Payer: Federal, State, Local not specified - PPO

## 2016-05-17 DIAGNOSIS — G4730 Sleep apnea, unspecified: Secondary | ICD-10-CM | POA: Diagnosis not present

## 2016-05-17 DIAGNOSIS — K4090 Unilateral inguinal hernia, without obstruction or gangrene, not specified as recurrent: Secondary | ICD-10-CM

## 2016-05-17 DIAGNOSIS — I10 Essential (primary) hypertension: Secondary | ICD-10-CM | POA: Insufficient documentation

## 2016-05-17 DIAGNOSIS — Z87891 Personal history of nicotine dependence: Secondary | ICD-10-CM | POA: Insufficient documentation

## 2016-05-17 DIAGNOSIS — Z79899 Other long term (current) drug therapy: Secondary | ICD-10-CM | POA: Diagnosis not present

## 2016-05-17 DIAGNOSIS — Z9989 Dependence on other enabling machines and devices: Secondary | ICD-10-CM | POA: Insufficient documentation

## 2016-05-17 DIAGNOSIS — R7981 Abnormal blood-gas level: Secondary | ICD-10-CM

## 2016-05-17 DIAGNOSIS — R0902 Hypoxemia: Secondary | ICD-10-CM | POA: Diagnosis not present

## 2016-05-17 DIAGNOSIS — E785 Hyperlipidemia, unspecified: Secondary | ICD-10-CM | POA: Diagnosis not present

## 2016-05-17 DIAGNOSIS — K219 Gastro-esophageal reflux disease without esophagitis: Secondary | ICD-10-CM | POA: Insufficient documentation

## 2016-05-17 DIAGNOSIS — N529 Male erectile dysfunction, unspecified: Secondary | ICD-10-CM | POA: Insufficient documentation

## 2016-05-17 DIAGNOSIS — G4733 Obstructive sleep apnea (adult) (pediatric): Secondary | ICD-10-CM | POA: Diagnosis not present

## 2016-05-17 HISTORY — DX: Failed or difficult intubation, initial encounter: T88.4XXA

## 2016-05-17 HISTORY — PX: LAPAROSCOPY: SHX197

## 2016-05-17 SURGERY — LAPAROSCOPY, DIAGNOSTIC
Anesthesia: General | Laterality: Left

## 2016-05-17 MED ORDER — OXYCODONE HCL 5 MG PO TABS: 5.0000 mg | ORAL_TABLET | Freq: Four times a day (QID) | ORAL | 0 refills | Status: DC | PRN

## 2016-05-17 MED ORDER — LIDOCAINE HCL (CARDIAC) 20 MG/ML IV SOLN
INTRAVENOUS | Status: DC | PRN
  Administered 2016-05-17: 30 mg via INTRAVENOUS

## 2016-05-17 MED ORDER — CHLORHEXIDINE GLUCONATE CLOTH 2 % EX PADS: 6.0000 | MEDICATED_PAD | Freq: Once | CUTANEOUS | Status: DC

## 2016-05-17 MED ORDER — GABAPENTIN 300 MG PO CAPS
ORAL_CAPSULE | ORAL | Status: AC
  Administered 2016-05-17: 300 mg via ORAL
  Filled 2016-05-17: qty 1

## 2016-05-17 MED ORDER — FENTANYL CITRATE (PF) 100 MCG/2ML IJ SOLN
INTRAMUSCULAR | Status: DC | PRN
  Administered 2016-05-17: 150 ug via INTRAVENOUS
  Administered 2016-05-17: 50 ug via INTRAVENOUS

## 2016-05-17 MED ORDER — CHLORHEXIDINE GLUCONATE CLOTH 2 % EX PADS
6.0000 | MEDICATED_PAD | Freq: Once | CUTANEOUS | Status: AC
  Administered 2016-05-17: 6 via TOPICAL

## 2016-05-17 MED ORDER — CEFAZOLIN SODIUM-DEXTROSE 2-4 GM/100ML-% IV SOLN
INTRAVENOUS | Status: AC
  Administered 2016-05-17: 2 g via INTRAVENOUS
  Filled 2016-05-17: qty 100

## 2016-05-17 MED ORDER — BUPIVACAINE-EPINEPHRINE (PF) 0.25% -1:200000 IJ SOLN
INTRAMUSCULAR | Status: AC
  Filled 2016-05-17: qty 30

## 2016-05-17 MED ORDER — ACETAMINOPHEN 500 MG PO TABS
1000.0000 mg | ORAL_TABLET | ORAL | Status: AC
  Administered 2016-05-17: 1000 mg via ORAL

## 2016-05-17 MED ORDER — MIDAZOLAM HCL 2 MG/2ML IJ SOLN
INTRAMUSCULAR | Status: DC | PRN
  Administered 2016-05-17: 2 mg via INTRAVENOUS

## 2016-05-17 MED ORDER — OXYCODONE HCL 5 MG PO TABS
5.0000 mg | ORAL_TABLET | Freq: Once | ORAL | Status: AC | PRN
  Administered 2016-05-17: 5 mg via ORAL

## 2016-05-17 MED ORDER — PROPOFOL 10 MG/ML IV BOLUS
INTRAVENOUS | Status: DC | PRN
  Administered 2016-05-17: 200 mg via INTRAVENOUS

## 2016-05-17 MED ORDER — SUGAMMADEX SODIUM 500 MG/5ML IV SOLN
INTRAVENOUS | Status: DC | PRN
  Administered 2016-05-17: 234 mg via INTRAVENOUS

## 2016-05-17 MED ORDER — FENTANYL CITRATE (PF) 100 MCG/2ML IJ SOLN
25.0000 ug | INTRAMUSCULAR | Status: DC | PRN
  Administered 2016-05-17 (×2): 25 ug via INTRAVENOUS

## 2016-05-17 MED ORDER — KETOROLAC TROMETHAMINE 30 MG/ML IJ SOLN
INTRAMUSCULAR | Status: DC | PRN
  Administered 2016-05-17: 30 mg via INTRAVENOUS

## 2016-05-17 MED ORDER — ONDANSETRON HCL 4 MG/2ML IJ SOLN
INTRAMUSCULAR | Status: DC | PRN
  Administered 2016-05-17: 4 mg via INTRAVENOUS

## 2016-05-17 MED ORDER — ROCURONIUM BROMIDE 100 MG/10ML IV SOLN
INTRAVENOUS | Status: DC | PRN
  Administered 2016-05-17: 40 mg via INTRAVENOUS
  Administered 2016-05-17: 20 mg via INTRAVENOUS

## 2016-05-17 MED ORDER — GLYCOPYRROLATE 0.2 MG/ML IJ SOLN
INTRAMUSCULAR | Status: DC | PRN
  Administered 2016-05-17: 0.2 mg via INTRAVENOUS

## 2016-05-17 MED ORDER — ACETAMINOPHEN 500 MG PO TABS
ORAL_TABLET | ORAL | Status: AC
  Administered 2016-05-17: 1000 mg via ORAL
  Filled 2016-05-17: qty 2

## 2016-05-17 MED ORDER — OXYCODONE HCL 5 MG/5ML PO SOLN: 5.0000 mg | Freq: Once | ORAL | Status: AC | PRN

## 2016-05-17 MED ORDER — DEXAMETHASONE SODIUM PHOSPHATE 10 MG/ML IJ SOLN
INTRAMUSCULAR | Status: DC | PRN
  Administered 2016-05-17: 8 mg via INTRAVENOUS

## 2016-05-17 MED ORDER — FENTANYL CITRATE (PF) 100 MCG/2ML IJ SOLN
INTRAMUSCULAR | Status: AC
  Administered 2016-05-17: 25 ug via INTRAVENOUS
  Filled 2016-05-17: qty 2

## 2016-05-17 MED ORDER — ALBUTEROL SULFATE HFA 108 (90 BASE) MCG/ACT IN AERS
INHALATION_SPRAY | RESPIRATORY_TRACT | Status: DC | PRN
  Administered 2016-05-17 (×2): 8 via RESPIRATORY_TRACT

## 2016-05-17 MED ORDER — GABAPENTIN 300 MG PO CAPS
300.0000 mg | ORAL_CAPSULE | ORAL | Status: AC
  Administered 2016-05-17: 300 mg via ORAL

## 2016-05-17 MED ORDER — SUCCINYLCHOLINE CHLORIDE 20 MG/ML IJ SOLN
INTRAMUSCULAR | Status: DC | PRN
  Administered 2016-05-17: 100 mg via INTRAVENOUS
  Administered 2016-05-17: 120 mg via INTRAVENOUS

## 2016-05-17 MED ORDER — LACTATED RINGERS IV SOLN
INTRAVENOUS | Status: DC
  Administered 2016-05-17: 10:00:00 via INTRAVENOUS

## 2016-05-17 MED ORDER — IBUPROFEN 600 MG PO TABS: 600.0000 mg | ORAL_TABLET | Freq: Three times a day (TID) | ORAL | 0 refills | Status: DC | PRN

## 2016-05-17 MED ORDER — BUPIVACAINE-EPINEPHRINE 0.25% -1:200000 IJ SOLN
INTRAMUSCULAR | Status: DC | PRN
  Administered 2016-05-17: 20 mL

## 2016-05-17 MED ORDER — OXYCODONE HCL 5 MG PO TABS
ORAL_TABLET | ORAL | Status: AC
  Administered 2016-05-17: 5 mg via ORAL
  Filled 2016-05-17: qty 1

## 2016-05-17 SURGICAL SUPPLY — 26 items
CATH TRAY 16F METER LATEX (MISCELLANEOUS) ×3 IMPLANT
CHLORAPREP W/TINT 26ML (MISCELLANEOUS) ×3 IMPLANT
DERMABOND ADVANCED (GAUZE/BANDAGES/DRESSINGS) ×1
DERMABOND ADVANCED .7 DNX12 (GAUZE/BANDAGES/DRESSINGS) ×2 IMPLANT
GLOVE SURG SYN 7.0 (GLOVE) ×3 IMPLANT
GLOVE SURG SYN 7.5  E (GLOVE) ×1
GLOVE SURG SYN 7.5 E (GLOVE) ×2 IMPLANT
GOWN STRL REUS W/ TWL LRG LVL3 (GOWN DISPOSABLE) ×4 IMPLANT
GOWN STRL REUS W/TWL LRG LVL3 (GOWN DISPOSABLE) ×2
IRRIGATION STRYKERFLOW (MISCELLANEOUS) IMPLANT
IRRIGATOR STRYKERFLOW (MISCELLANEOUS)
LABEL OR SOLS (LABEL) ×3 IMPLANT
NDL SAFETY 22GX1.5 (NEEDLE) ×3 IMPLANT
NS IRRIG 500ML POUR BTL (IV SOLUTION) ×3 IMPLANT
PACK LAP CHOLECYSTECTOMY (MISCELLANEOUS) ×3 IMPLANT
PENCIL ELECTRO HAND CTR (MISCELLANEOUS) ×3 IMPLANT
SCISSORS METZENBAUM CVD 33 (INSTRUMENTS) ×3 IMPLANT
SLEEVE ADV FIXATION 5X100MM (TROCAR) ×6 IMPLANT
SUT MNCRL 4-0 (SUTURE) ×1
SUT MNCRL 4-0 27XMFL (SUTURE) ×2
SUT VICRYL 0 AB UR-6 (SUTURE) ×3 IMPLANT
SUTURE MNCRL 4-0 27XMF (SUTURE) ×2 IMPLANT
TACKER 5MM HERNIA 3.5CML NAB (ENDOMECHANICALS) ×3 IMPLANT
TROCAR BLUNT TIP 12MM OMST12BT (TROCAR) ×3 IMPLANT
TUBING INSUFFLATOR HI FLOW (MISCELLANEOUS) ×3 IMPLANT
WATER STERILE IRR 1000ML POUR (IV SOLUTION) ×3 IMPLANT

## 2016-05-17 NOTE — Anesthesia Postprocedure Evaluation (Addendum)
Anesthesia Post Note  Patient: Curtis Harper  Procedure(s) Performed: Procedure(s): LAPAROSCOPY DIAGNOSTIC  Patient location during evaluation: PACU Anesthesia Type: General Level of consciousness: awake and alert Pain management: pain level controlled Vital Signs Assessment: post-procedure vital signs reviewed and stable Respiratory status: spontaneous breathing, nonlabored ventilation, respiratory function stable and patient connected to nasal cannula oxygen Cardiovascular status: blood pressure returned to baseline and stable Postop Assessment: no signs of nausea or vomiting Comments: MD called to OR room after patient desaturated with insulation after T berg positioning.  Insufflation released but saturation did not improve.  Pulse Ox location changed, breath sounds equal, patient fully paralyzed, ETT suctioned, peep increased, albuterol given.  Saturation slowly increased with these maneuvers into the upper 80's lower 90s on 100 % FiO2.  Patient responded well to recruitment maneuver but still continues to desaturate into the 80s on 100% FiO2 with insufflation. Discussion with surgeon, that he would like to abort the procedure and bring the patient back for an open procedure in the future CXR in the PACU showed clear lung but did show cardiomegaly  We would like the patient to have an ECHO to rule out intracardiac process before his next procedure, we communicated this with the surgical team who reported that they would arrange this.    Last Vitals:  Vitals:   05/17/16 1300 05/17/16 1344  BP: 116/75 (!) 148/89  Pulse: 72   Resp: (!) 23   Temp:      Last Pain:  Vitals:   05/17/16 1244  TempSrc:   PainSc: 4                  Cleda MccreedyJoseph K Arlette Schaad

## 2016-05-17 NOTE — Transfer of Care (Signed)
Immediate Anesthesia Transfer of Care Note  Patient: Curtis Harper  Procedure(s) Performed: Procedure(s): LAPAROSCOPY DIAGNOSTIC  Patient Location: PACU  Anesthesia Type:General  Level of Consciousness: patient cooperative and lethargic  Airway & Oxygen Therapy: Patient Spontanous Breathing and Patient connected to face mask oxygen  Post-op Assessment: Report given to RN and Post -op Vital signs reviewed and stable  Post vital signs: Reviewed and stable  Last Vitals:  Vitals:   05/17/16 0912 05/17/16 1148  BP: 134/86 114/67  Pulse: 71 72  Resp: 18 18  Temp: (!) 35.9 C 36.4 C    Last Pain:  Vitals:   05/17/16 0912  TempSrc: Tympanic  PainSc: 0-No pain         Complications: No apparent anesthesia complications

## 2016-05-17 NOTE — Interval H&P Note (Signed)
History and Physical Interval Note:  05/17/2016 9:45 AM  Curtis Harper  has presented today for surgery, with the diagnosis of left inguinal hernia  The various methods of treatment have been discussed with the patient and family. After consideration of risks, benefits and other options for treatment, the patient has consented to  Procedure(s): LAPAROSCOPIC INGUINAL HERNIA (Left) as a surgical intervention .  The patient's history has been reviewed, patient examined, no change in status, stable for surgery.  I have reviewed the patient's chart and labs.  Questions were answered to the patient's satisfaction.     Telsa Dillavou

## 2016-05-17 NOTE — Anesthesia Procedure Notes (Signed)
Procedure Name: Intubation Date/Time: 05/17/2016 10:11 AM Performed by: Omer JackWEATHERLY, Curtis Gallentine Pre-anesthesia Checklist: Patient identified, Patient being monitored, Timeout performed, Emergency Drugs available and Suction available Patient Re-evaluated:Patient Re-evaluated prior to inductionOxygen Delivery Method: Circle system utilized Preoxygenation: Pre-oxygenation with 100% oxygen Intubation Type: IV induction Ventilation: Mask ventilation without difficulty Laryngoscope Size: 4 and McGraph Grade View: Grade III Tube type: Oral Tube size: 7.5 mm Number of attempts: 3 Airway Equipment and Method: Stylet and Rigid stylet Placement Confirmation: ETT inserted through vocal cords under direct vision,  positive ETCO2 and breath sounds checked- equal and bilateral Secured at: 23 cm Tube secured with: Tape Dental Injury: Teeth and Oropharynx as per pre-operative assessment  Difficulty Due To: Difficulty was anticipated, Difficult Airway- due to large tongue and Difficult Airway- due to anterior larynx Future Recommendations: Recommend- induction with short-acting agent, and alternative techniques readily available Comments: Pt with grade3 view. Used McGrath x3..Needed a rigid stylet. Very anterior airway. Easy mask. Sats maintained during induction and intubation

## 2016-05-17 NOTE — H&P (View-Only) (Signed)
05/04/2016  Reason for Visit:  Left inguinal hernia  History of Present Illness: Curtis Harper is a 45 y.o. male who presents with a left inguinal hernia.  The patient reports that last week on 11/10 he was doing heavy lifting and pushing while at work and noted a pulling feeling over his left groin.  He felt initially that it was a pulled muscle and went to work again on 11/11, but his pain has progressively worsened and also has noted swelling that did not go away.  He saw his PCP on 11/13 and was referred to us for evaluation.    He had been admitted in May 2017 with a peri-rectal abscess that required drainage in the OR and had a CT scan that showed a left fat containing inguinal hernia.  However, the patient denies feeling any bulging at that point until last week. He reports having had an open left inguinal hernia repair in 2003 in [place].  Otherwise, denies having any fevers, chills, chest pain, shortness of breath, nausea, vomiting, other abdominal pain, diarrhea, or constipation.  He has had some blood in the stools and is getting a colonoscopy and EGD with GI in the near future.  The bulging and discomfort are better when he lies down, but has not tried to check if the bulging disappears or is reducible.   Past Medical History: Past Medical History:  Diagnosis Date  . Abscess    Peri-rectal abscess  . Chicken pox   . ED (erectile dysfunction)   . GERD (gastroesophageal reflux disease)   . Hernia, inguinal, left   . Hyperlipidemia   . Hypertension   . Migraine   . Urinary retention      Past Surgical History: Past Surgical History:  Procedure Laterality Date  . ABSCESS DRAINAGE    . BACK SURGERY  1999   L-5  . CHOLECYSTECTOMY    . INCISION AND DRAINAGE ABSCESS N/A 11/12/2015   Procedure: INCISION AND DRAINAGE ABSCESS;  Surgeon: Leafy Roiego F Pabon, MD;  Location: [facility] ORS;  Service: General;  Laterality: N/A;  . ROTATOR CUFF REPAIR Left   . TONSILLECTOMY AND ADENOIDECTOMY       Home Medications: Prior to Admission medications   Medication Sig Start Date End Date Taking? Authorizing Provider  metoprolol succinate (TOPROL-XL) 25 MG 24 hr tablet Take 1 tablet (25 mg total) by mouth daily. 01/18/16  Yes Duanne Limerickeanna C Jones, MD  pantoprazole (PROTONIX) 40 MG tablet Take 1 tablet (40 mg total) by mouth 2 (two) times daily before a meal. 03/23/16  Yes Tommie SamsJayce G Cook, DO  ranitidine (ZANTAC) 150 MG tablet Take 1 tablet (150 mg total) by mouth 2 (two) times daily. 05/03/16  Yes Unk LightningJennifer Lynne Lemmon, PA  sildenafil (VIAGRA) 100 MG tablet Take 0.5-1 tablets (50-100 mg total) by mouth daily as needed for erectile dysfunction. 03/24/16  Yes Jayce G Cook, DO  valsartan-hydrochlorothiazide (DIOVAN-HCT) 320-25 MG tablet Take 1 tablet by mouth 1 day or 1 dose. 01/18/16  Yes Duanne Limerickeanna C Jones, MD    Allergies: Allergies  Allergen Reactions  . Lisinopril Cough    cough    Social History:  reports that he has quit smoking. His smoking use included Cigarettes. He quit after 10.00 years of use. He has never used smokeless tobacco. He reports that he drinks alcohol. He reports that he does not use drugs.   Family History: Family History  Problem Relation Age of Onset  . Stomach cancer Father  spread to lungs  . Hypertension Mother   . Prostate cancer Maternal Grandfather     Review of Systems: Review of Systems  Constitutional: Negative for chills and fever.  HENT: Negative for hearing loss.   Eyes: Negative for blurred vision.  Respiratory: Negative for cough and shortness of breath.   Cardiovascular: Negative for chest pain and leg swelling.  Gastrointestinal: Positive for abdominal pain (left inguinal discomfort with activity) and blood in stool. Negative for constipation, diarrhea, heartburn, nausea and vomiting.  Genitourinary: Negative for dysuria and hematuria.  Musculoskeletal: Negative for myalgias.  Skin: Negative for rash.  Neurological: Negative for dizziness.   Psychiatric/Behavioral: Negative for depression.  All other systems reviewed and are negative.   Physical Exam BP 133/80   Pulse 79   Temp 98 F (36.7 C) (Oral)   Ht 5' 10" (1.778 m)   Wt 117 kg (258 lb)   BMI 37.02 kg/m  CONSTITUTIONAL: No acute distress. HEENT:  Normocephalic, atraumatic, extraocular motion intact. NECK: Trachea is midline, and there is no jugular venous distension.  RESPIRATORY:  Lungs are clear, and breath sounds are equal bilaterally. Normal respiratory effort without pathologic use of accessory muscles. CARDIOVASCULAR: Heart is regular without murmurs, gallops, or rubs. GI: The abdomen is soft, obese, non-distended, tender to palpation over the left groin.  Patient does have a reducible left inguinal hernia.  No overlying skin discoloration or changes.  There were no palpable masses. Patient has scars from prior laparoscopic cholecystectomy and a right groin scar from an injury, all well healed.  Possible very small umbilical hernia. MUSCULOSKELETAL:  Normal muscle strength and tone in all four extremities.  No peripheral edema or cyanosis. SKIN: Skin turgor is normal. There are no pathologic skin lesions.  NEUROLOGIC:  Motor and sensation is grossly normal.  Cranial nerves are grossly intact. PSYCH:  Alert and oriented to person, place and time. Affect is normal.  Laboratory Analysis: No results found for this or any previous visit (from the past 24 hour(s)).  Imaging: No results found.  Assessment and Plan: This is a 45 y.o. male who presents with a left inguinal hernia, which is reducible, but symptomatic.  Discussed with the patient the different types of hernia and that his is reducible and there is no emergency to repair it.  Discussed the options of open versus laparoscopic approaches and that since he had an open repair in the past, I would like to approach his recurrence laparoscopically.  That way we can also repair an umbilical hernia if one is  found and we can also evaluate the right groin for any hernia and fix it if needed.  The patient agreed with a laparoscopic left inguinal hernia repair.  The risks and benefits of the procedure were discussed with the patient.  He will be scheduled for 11/28.  He understands this plan and all of his questions have been answered.   Curtis Popowski Luis Kennady Zimmerle, MD [facility]   

## 2016-05-17 NOTE — Op Note (Signed)
  Procedure Date:  05/17/2016  Pre-operative Diagnosis:  Left Inguinal hernia  Post-operative Diagnosis: Left inguinal hernia  Procedure:  Diagnostic laparoscopy  Surgeon:  Howie IllJose Luis Dexter Signor, MD  Anesthesia:  General endotracheal  Estimated Blood Loss:  5 ml  Specimens:  None  Complications:  Oxygen desaturation requiring abortion of procedure  Indications for Procedure:  This is a 45 y.o. male who presents with a left inguinal hernia.  The options of surgery versus observation were reviewed with the patient and/or family. The risks of bleeding, abscess or infection, recurrence of symptoms, potential for an open procedure, injury to surrounding structures, and chronic pain were all discussed with the patient and was willing to proceed.  Description of Procedure: The patient was correctly identified in the preoperative area and brought into the operating room.  The patient was placed supine with VTE prophylaxis in place.  Appropriate time-outs were performed.  Anesthesia was induced and the patient was intubated.  Foley catheter was placed.  Appropriate antibiotics were infused.  The abdomen was prepped and draped in a sterile fashion. An infraumbilical incision was made. A cutdown technique was used to enter the abdominal cavity without injury, and a Hasson trocar was inserted.  Pneumoperitoneum was obtained with appropriate opening pressures.  Two 5-mm ports were placed in the right and left lateral positions under direct visualization. The patient was then placed in Trendelenburg position. A left inguinal hernia was identified and there was no hernia on the right side. At that point with his abdomen insufflated, he had a desaturation event. He was placed back in supine position and is pneumoperitoneum was decompressed. After manual ventilation and increasing the PEEP and FiO2 he regained appropriate oxygen saturation. He was scoped by the anesthesiologist who did not find any mucous plugs.  His FiO2 was weaned down and pneumoperitoneum was re-obtained and he was placed back in Trendelenburg position one more time. At this point his oxygen saturation dropped once more and it was decided to abort the case.  The 5 mm ports were removed under direct visualization and the Hasson trocar was removed.  The fascial opening was closed using 0 vicryl suture.  Local anesthetic was infused in all incisions and the incisions were closed with 4-0 Monocryl.  The wounds were cleaned and sealed with DermaBond.  Foley catheter was removed and the patient was emerged from anesthesia and extubated and brought to the recovery room for further management.  All counts were correct at the end of the case.   Howie IllJose Luis Lasharon Dunivan, MD

## 2016-05-17 NOTE — Discharge Instructions (Signed)

## 2016-05-17 NOTE — Anesthesia Preprocedure Evaluation (Signed)
Anesthesia Evaluation  Patient identified by MRN, date of birth, ID band Patient awake    Reviewed: Allergy & Precautions, H&P , NPO status , Patient's Chart, lab work & pertinent test results  History of Anesthesia Complications Negative for: history of anesthetic complications  Airway Mallampati: III  TM Distance: >3 FB Neck ROM: full    Dental no notable dental hx. (+) Poor Dentition, Chipped   Pulmonary neg shortness of breath, sleep apnea and Continuous Positive Airway Pressure Ventilation , former smoker,    Pulmonary exam normal breath sounds clear to auscultation       Cardiovascular Exercise Tolerance: Good hypertension, (-) angina(-) Past MI and (-) DOE Normal cardiovascular exam Rhythm:regular Rate:Normal     Neuro/Psych  Headaches, negative psych ROS   GI/Hepatic Neg liver ROS, GERD  Controlled,  Endo/Other  negative endocrine ROS  Renal/GU      Musculoskeletal   Abdominal   Peds  Hematology negative hematology ROS (+)   Anesthesia Other Findings Past Medical History: No date: Abscess     Comment: Peri-rectal abscess No date: Chicken pox No date: ED (erectile dysfunction) No date: GERD (gastroesophageal reflux disease) No date: Hernia, inguinal, left No date: Hyperlipidemia No date: Hypertension No date: Migraine No date: Sleep apnea     Comment: BIPAP No date: Urinary retention  Past Surgical History: No date: ABSCESS DRAINAGE 1999: BACK SURGERY     Comment: L-5 No date: CHOLECYSTECTOMY 11/12/2015: INCISION AND DRAINAGE ABSCESS N/A     Comment: Procedure: INCISION AND DRAINAGE ABSCESS;                Surgeon: Leafy Roiego F Pabon, MD;  Location: [facility]               ORS;  Service: General;  Laterality: N/A; 2013: NECK FUSION No date: ROTATOR CUFF REPAIR Left No date: TONSILLECTOMY AND ADENOIDECTOMY  BMI    Body Mass Index:  37.02 kg/m      Reproductive/Obstetrics negative OB ROS                              Anesthesia Physical Anesthesia Plan  ASA: III  Anesthesia Plan: General ETT   Post-op Pain Management:    Induction:   Airway Management Planned:   Additional Equipment:   Intra-op Plan:   Post-operative Plan:   Informed Consent: I have reviewed the patients History and Physical, chart, labs and discussed the procedure including the risks, benefits and alternatives for the proposed anesthesia with the patient or authorized representative who has indicated his/her understanding and acceptance.   Dental Advisory Given  Plan Discussed with: Anesthesiologist, CRNA and Surgeon  Anesthesia Plan Comments:         Anesthesia Quick Evaluation

## 2016-05-17 NOTE — Telephone Encounter (Signed)
Patient's disability form was filled out and faxed. 

## 2016-05-17 NOTE — OR Nursing (Signed)
Dr Orson ApePiscatello in to see pt and pt mother to discuss the episode of low O2 sat during surgery.  Pt to follow up with Dr Aleen CampiPiscoya next week and office to schedule cardiology consult.

## 2016-05-17 NOTE — Telephone Encounter (Signed)
Patient's case cancelled for today due to oxygen desaturation during intubation.  Dr. Aleen CampiPiscoya would like to obtain an Echocardiogram and have patient see Cardiology and Pulmonology for clearance prior to rescheduling and OPEN inguinal hernia repair.

## 2016-05-17 NOTE — Progress Notes (Signed)
Chest X-ray completed , viewed by Dr. Aleen CampiPiscoya

## 2016-05-18 ENCOUNTER — Telehealth: Payer: Self-pay | Admitting: Surgery

## 2016-05-18 ENCOUNTER — Telehealth: Payer: Self-pay | Admitting: Family Medicine

## 2016-05-18 NOTE — Telephone Encounter (Signed)
Note written, signed, and at front desk in [facility].

## 2016-05-18 NOTE — Telephone Encounter (Signed)
Pt called and stated that he went to have surgery on his surgery. They were unable to do the surgery do to his oxygen levels. They want to pt to see a Pulmonologist and a Cardiologist. Pt is really not sure what is going on. Would like a call back to maybe explain what is going on.  Please advise, thank you!  Call pt @ [phone]

## 2016-05-18 NOTE — Telephone Encounter (Signed)
Needs a doctors note for 11/28 until Dec 6  [place] He will pick it up in the [place] office today after lunch.

## 2016-05-18 NOTE — Telephone Encounter (Signed)
Spoke with patient at this time. He has no preference of Cardiologist or Pulmonologist.   Please refer to [facility]st [facility] per patient. Call with appointment information please.   I will send clearances as soon as I know what Physicians patient will be seeing.

## 2016-05-18 NOTE — Telephone Encounter (Signed)
Your recommendations? 

## 2016-05-19 ENCOUNTER — Encounter: Payer: Self-pay | Admitting: Family Medicine

## 2016-05-19 NOTE — Telephone Encounter (Signed)
Voicemail left stating letter was ready upfront.

## 2016-05-19 NOTE — Telephone Encounter (Signed)
I read the operative report. I'm not sure of the etiology either. Would proceed with their recommendation to see Pulm and Cardiology.

## 2016-05-19 NOTE — Telephone Encounter (Signed)
Pt wants a letter stating that he has not had the procedure because his note now states he had the procedure. Do to the fact pt has a new employer they need to know the procedure was not completed. Pt would like for you to refer him to cardiology and pulmonology.

## 2016-05-19 NOTE — Telephone Encounter (Signed)
Patient has been advised of appointment made for pulmonology and cardiology.   05/24/16 @ 10:00am with Dr End--Cardiology 05/26/16 @ 9:00am with Dr Luther RedoSimonds--Pulmonology   Patient has stated that he also needs a more detailed back to work note. Please advise patient when this has been completed.

## 2016-05-19 NOTE — Telephone Encounter (Signed)
He can pick up letter.  

## 2016-05-20 NOTE — Telephone Encounter (Signed)
Spoke with patient at this time. He explained that his PCP has written one note and our note is conflicting with their note. His boss is wanting another detailed note discussing that surgery would be rescheduled with the reschedule date.   I explained to patient that he would be seen in office on 05/25/16 and a reschedule date would be decided at that time. But he may have his boss call me if he needs further assistance.  He verbalizes understanding of this.

## 2016-05-24 ENCOUNTER — Encounter: Payer: Self-pay | Admitting: Internal Medicine

## 2016-05-24 ENCOUNTER — Ambulatory Visit (INDEPENDENT_AMBULATORY_CARE_PROVIDER_SITE_OTHER): Payer: 59 | Admitting: Internal Medicine

## 2016-05-24 VITALS — BP 122/80 | HR 63 | Ht 71.0 in | Wt 258.8 lb

## 2016-05-24 DIAGNOSIS — I10 Essential (primary) hypertension: Secondary | ICD-10-CM | POA: Diagnosis not present

## 2016-05-24 DIAGNOSIS — R0902 Hypoxemia: Secondary | ICD-10-CM

## 2016-05-24 NOTE — Patient Instructions (Addendum)
Medication Instructions:  Your physician recommends that you continue on your current medications as directed. Please refer to the Current Medication list given to you today.   Labwork: none  Testing/Procedures: Your physician has requested that you have an echocardiogram WITH BUBBLE STUDY. Echocardiography is a painless test that uses sound waves to create images of your heart. It provides your doctor with information about the size and shape of your heart and how well your heart's chambers and valves are working. This procedure takes approximately one hour. There are no restrictions for this procedure.  Please [facility]tr[facility] on Monday, May 30, 2016 at 9:45am (that is 15 minutes prior to your 10:00 appt).    Follow-Up: Your physician recommends that you schedule a follow-up appointment in: 6 WEEKS WITH DR END.   Echocardiogram An echocardiogram, or echocardiography, uses sound waves (ultrasound) to produce an image of your heart. The echocardiogram is simple, painless, obtained within a short period of time, and offers valuable information to your health care provider. The images from an echocardiogram can provide information such as:  Evidence of coronary artery disease (CAD).  Heart size.  Heart muscle function.  Heart valve function.  Aneurysm detection.  Evidence of a past heart attack.  Fluid buildup around the heart.  Heart muscle thickening.  Assess heart valve function. Tell a health care provider about:  Any allergies you have.  All medicines you are taking, including vitamins, herbs, eye drops, creams, and over-the-counter medicines.  Any problems you or family members have had with anesthetic medicines.  Any blood disorders you have.  Any surgeries you have had.  Any medical conditions you have.  Whether you are pregnant or may be pregnant. What happens before the procedure? No special preparation is needed. Eat and drink  normally. What happens during the procedure?  In order to produce an image of your heart, gel will be applied to your chest and a wand-like tool (transducer) will be moved over your chest. The gel will help transmit the sound waves from the transducer. The sound waves will harmlessly bounce off your heart to allow the heart images to be captured in real-time motion. These images will then be recorded.  You may need an IV to receive a medicine that improves the quality of the pictures. What happens after the procedure? You may return to your normal schedule including diet, activities, and medicines, unless your health care provider tells you otherwise. This information is not intended to replace advice given to you by your health care provider. Make sure you discuss any questions you have with your health care provider. Document Released: 06/03/2000 Document Revised: 01/23/2016 Document Reviewed: 02/11/2013 Elsevier Interactive Patient Education  2017 [employer]

## 2016-05-24 NOTE — Progress Notes (Signed)
New Outpatient Visit Date: 05/24/2016  Referring Provider: Henrene Dodge, MD [facility] [address], [address] [place], [place] [address]    Chief Complaint: Intraoperative hypoxia  HPI:  Curtis Harper is a 45 y.o. year-old male with history of hypertension, hyperlipidemia, GERD, sleep apnea, and obesity, who has been referred by Dr. Aleen Campi for evaluation after intraoperative hypoxia. The patient was scheduled to undergo laparoscopic left inguinal hernia repair on 05/17/16. After intubation, the patient had two episodes of significant hypoxia during insufflation of the peritoneum and positioning in Trendelenburg position. The procedure was ultimately aborted without repair of the inguinal hernia. Postoperative chest radiograph was notable for enlargement of the cardiac silhouette. Since leaving the hospital, the patient has felt well. He has mild exertional dyspnea when climbing several stairs or walking briskly. This has been a resident for years and is unchanged. He denies chest pain, palpitations, lightheadedness, and leg edema. He sleeps nightly with his CPAP machine, which she is used for about 6 years.  The patient denies a history of cardiovascular disease and prior cardiac workup. He had undergone multiple surgeries in the past without any intraoperative difficulty. 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 The patient is scheduled for evaluation in the pulmonology clinic next week.  --------------------------------------------------------------------------------------------------  Cardiovascular History & Procedures: Cardiovascular Problems:  Intraoperative hypoxia  Dyspnea on exertion  Risk Factors:  Hypertension, hyperlipidemia, obesity, and male gender  Cath/PCI:  None  CV Surgery:  None  EP Procedures and  Devices:  None  Non-Invasive Evaluation(s):  None  Recent CV Pertinent Labs: Lab Results  Component Value Date   CHOL 202 (H) 03/23/2016   HDL 52.60 03/23/2016   LDLCALC 117 (H) 03/23/2016   TRIG 162.0 (H) 03/23/2016   CHOLHDL 4 03/23/2016   K 3.5 05/16/2016   BUN 12 03/23/2016   BUN 11 01/18/2016   CREATININE 0.78 03/23/2016   --------------------------------------------------------------------------------------------------  Past Medical History:  Diagnosis Date  . Abscess    Peri-rectal abscess  . Chicken pox   . Difficult intubation   . ED (erectile dysfunction)   . GERD (gastroesophageal reflux disease)   . Hernia, inguinal, left   . Hyperlipidemia   . Hypertension   . Migraine   . Sleep apnea    BIPAP  . Urinary retention     Past Surgical History:  Procedure Laterality Date  . ABSCESS DRAINAGE    . BACK SURGERY  1999   L-5  . CHOLECYSTECTOMY    . INCISION AND DRAINAGE ABSCESS N/A 11/12/2015   Procedure: INCISION AND DRAINAGE ABSCESS;  Surgeon: Leafy Ro, MD;  Location: [facility] ORS;  Service: General;  Laterality: N/A;  . LAPAROSCOPY  05/17/2016   Procedure: LAPAROSCOPY DIAGNOSTIC;  Surgeon: Henrene Dodge, MD;  Location: [facility] ORS;  Service: General;;  . NECK FUSION  2013  . ROTATOR CUFF REPAIR Left   . TONSILLECTOMY AND ADENOIDECTOMY      Outpatient Encounter Prescriptions as of 05/24/2016  Medication Sig  . ibuprofen (ADVIL,MOTRIN) 600 MG tablet Take 1 tablet (600 mg total) by mouth every 8 (eight) hours as needed for fever or mild pain.  . metoprolol succinate (TOPROL-XL) 25 MG 24 hr tablet Take 1 tablet (25 mg total) by mouth daily. (Patient taking differently: Take 25 mg by mouth every morning. )  . pantoprazole (PROTONIX) 40 MG tablet Take 1 tablet (40 mg total)  by mouth 2 (two) times daily before a meal.  . ranitidine (ZANTAC) 150 MG tablet Take 1 tablet (150 mg total) by mouth 2 (two) times daily.  . sildenafil (VIAGRA) 100 MG tablet Take 0.5-1  tablets (50-100 mg total) by mouth daily as needed for erectile dysfunction.  . valsartan-hydrochlorothiazide (DIOVAN-HCT) 320-25 MG tablet Take 1 tablet by mouth 1 day or 1 dose. (Patient taking differently: Take 1 tablet by mouth daily. )  . [DISCONTINUED] oxyCODONE (OXY IR/ROXICODONE) 5 MG immediate release tablet Take 1-2 tablets (5-10 mg total) by mouth every 6 (six) hours as needed for severe pain. (Patient not taking: Reported on 05/24/2016)   No facility-administered encounter medications on file as of 05/24/2016.    Allergies: Lisinopril  Social History   Social History  . Marital status: Divorced    Spouse name: N/A  . Number of children: 4  . Years of education: N/A   Occupational History  . truck driver    Social History Main Topics  . Smoking status: Former Smoker    Packs/day: 0.50    Years: 10.00    Types: Cigarettes    Quit date: 05/11/1996  . Smokeless tobacco: Never Used  . Alcohol use Yes     Comment: Occasional  . Drug use: No  . Sexual activity: Yes    Partners: Female   Other Topics Concern  . Not on file   Social History Narrative  . No narrative on file    Family History  Problem Relation Age of Onset  . Stomach cancer Father     spread to lungs  . Hypertension Mother   . Prostate cancer Maternal Grandfather     Review of Systems: The patient reports intermittent headaches and was diagnosed with migraines about one year ago. Otherwise, a 12-system review of systems was performed and was negative except as noted in the HPI.  --------------------------------------------------------------------------------------------------  Physical Exam: BP 122/80 (BP Location: Right Arm, Patient Position: Sitting, Cuff Size: Normal)   Pulse 63   Ht 5\' 11"  (1.803 m)   Wt 258 lb 12 oz (117.4 kg)   SpO2 99%   BMI 36.09 kg/m   General:  Obese man, seated comfortably in the exam room. HEENT: No conjunctival pallor or scleral icterus.  Moist mucous  membranes.  OP clear. Neck: Supple without lymphadenopathy, thyromegaly, JVD, or HJR.  No carotid bruit. Lungs: Normal work of breathing.  Clear to auscultation bilaterally without wheezes or crackles. Heart: Regular rate and rhythm without murmurs, rubs, or gallops.  Unable to assess PMI due to body habitus. Abd: Bowel sounds present.  Soft, with mild tenderness around laparoscopy scars. Nondistended without hepatosplenomegaly. Healing laparoscopy scars noted on the abdomen. Ext: No lower extremity edema.  Radial, PT, and DP pulses are 2+ bilaterally Skin: warm and dry without rash Neuro: CNIII-XII intact.  Strength and fine-touch sensation intact in upper and lower extremities bilaterally. Psych: Normal mood and affect.  EKG:  Normal sinus rhythm without significant abnormalities. Heart rate has decreased from prior tracing on 11/17/15 (I have personally reviewed both tracings).  CXR (05/17/16): I personally reviewed the radiograph. There is shallow inspiration with prominence of the cardiac silhouette. No focal airspace opacity is evident.  Lab Results  Component Value Date   WBC 7.7 03/23/2016   HGB 13.6 03/23/2016   HCT 40.0 03/23/2016   MCV 84.8 03/23/2016   PLT 333.0 03/23/2016    Lab Results  Component Value Date   NA 138 03/23/2016  K 3.5 05/16/2016   CL 99 03/23/2016   CO2 31 03/23/2016   BUN 12 03/23/2016   CREATININE 0.78 03/23/2016   GLUCOSE 85 03/23/2016   ALT 17 03/23/2016    Lab Results  Component Value Date   CHOL 202 (H) 03/23/2016   HDL 52.60 03/23/2016   LDLCALC 117 (H) 03/23/2016   TRIG 162.0 (H) 03/23/2016   CHOLHDL 4 03/23/2016   --------------------------------------------------------------------------------------------------  ASSESSMENT AND PLAN: Intraoperative hypoxia Etiology of Curtis Harper's intraoperative hypoxia remains uncertain. It is possible that increased intra-abdominal pressure with Trendelenburg positioning may have affected his  respiratory mechanics leading to hypoxia. However, the patient has not had issues with this in the past, including laparoscopic cholecystectomy. Transient right to left intracardiac shunt precipitated by elevated central venous pressure would also be a consideration. Today, Curtis Harper is asymptomatic with normal oxygen saturation. He appears euvolemic and well compensated on exam today. His EKG does not show any significant abnormalities. We have agreed to perform a transthoracic echocardiogram with bubble study to further evaluate for structural abnormalities, including intracardiac shunt. I have a low suspicion for obstructive CAD leading to his intraoperative event. I recommend that the patient avoid undergoing further anesthesia lash surgery until workup has been completed. I encouraged him to proceed with his scheduled pulmonology appointment. He may return to his normal activities from a cardiac standpoint, including driving.  Hypertension Blood pressure well-controlled today. We will not make any changes to the patient's current regimen of metoprolol and valsartan-hydrochlorothiazide.  Follow-up: Return to clinic in 6 weeks to review results of echocardiogram/bubble study.  Yvonne Kendallhristopher Arvin Abello, MD 05/24/2016 10:06 AM

## 2016-05-25 ENCOUNTER — Telehealth: Payer: Self-pay

## 2016-05-25 ENCOUNTER — Ambulatory Visit (INDEPENDENT_AMBULATORY_CARE_PROVIDER_SITE_OTHER): Payer: 59 | Admitting: Surgery

## 2016-05-25 ENCOUNTER — Encounter: Payer: Self-pay | Admitting: Surgery

## 2016-05-25 VITALS — BP 137/87 | HR 72 | Temp 98.2°F | Ht 71.0 in | Wt 256.2 lb

## 2016-05-25 DIAGNOSIS — K4090 Unilateral inguinal hernia, without obstruction or gangrene, not specified as recurrent: Secondary | ICD-10-CM

## 2016-05-25 NOTE — Telephone Encounter (Signed)
-----   Message from Tommie SamsJayce G Cook, DO sent at 05/24/2016 12:00 PM EST ----- Patient has now seen cardiology.  I spoke with pulm, Dr. Sung AmabileSimonds who recommended CTA (in addition to Echo which has been ordered by Cards). Does he have an appt with pulm?

## 2016-05-25 NOTE — Telephone Encounter (Signed)
Pt was called and he stated he has a appt with pulm tomorrow.

## 2016-05-25 NOTE — Patient Instructions (Signed)
You have chose to have your hernia repaired. This will be done by Dr. Aleen CampiPiscoya on 06/07/16 at [facility].  Please see your (blue) Pre-care information that you have been given today.  You will need to arrange to be out of work for 2 weeks and then return with a lifting restrictions for 4 more weeks. Please send any FMLA paperwork prior to surgery and we will fill this out and fax it back to your employer within 3 business days.  You may have a bruise in your groin and also swelling and brusing in your testicle area. You may use ice 4-5 times daily for 15-20 minutes each time. Make sure that you place a barrier between you and the ice pack.  Inguinal Hernia, Adult Muscles help keep everything in the body in its proper place. But if a weak spot in the muscles develops, something can poke through. That is called a hernia. When this happens in the lower part of the belly (abdomen), it is called an inguinal hernia. (It takes its name from a part of the body in this region called the inguinal canal.) A weak spot in the wall of muscles lets some fat or part of the small intestine bulge through. An inguinal hernia can develop at any age. Men get them more often than women. CAUSES  In adults, an inguinal hernia develops over time.  It can be triggered by:  Suddenly straining the muscles of the lower abdomen.  Lifting heavy objects.  Straining to have a bowel movement. Difficult bowel movements (constipation) can lead to this.  Constant coughing. This may be caused by smoking or lung disease.  Being overweight.  Being pregnant.  Working at a job that requires long periods of standing or heavy lifting.  Having had an inguinal hernia before. One type can be an emergency situation. It is called a strangulated inguinal hernia. It develops if part of the small intestine slips through the weak spot and cannot get back into the abdomen. The blood supply can be cut off. If that happens, part of the intestine  may die. This situation requires emergency surgery. SYMPTOMS  Often, a small inguinal hernia has no symptoms. It is found when a healthcare provider does a physical exam. Larger hernias usually have symptoms.   In adults, symptoms may include:  A lump in the groin. This is easier to see when the person is standing. It might disappear when lying down.  In men, a lump in the scrotum.  Pain or burning in the groin. This occurs especially when lifting, straining or coughing.  A dull ache or feeling of pressure in the groin.  Signs of a strangulated hernia can include:  A bulge in the groin that becomes very painful and tender to the touch.  A bulge that turns red or purple.  Fever, nausea and vomiting.  Inability to have a bowel movement or to pass gas. DIAGNOSIS  To decide if you have an inguinal hernia, a healthcare provider will probably do a physical examination.  This will include asking questions about any symptoms you have noticed.  The healthcare provider might feel the groin area and ask you to cough. If an inguinal hernia is felt, the healthcare provider may try to slide it back into the abdomen.  Usually no other tests are needed. TREATMENT  Treatments can vary. The size of the hernia makes a difference. Options include:  Watchful waiting. This is often suggested if the hernia is small and you  have had no symptoms.  No medical procedure will be done unless symptoms develop.  You will need to watch closely for symptoms. If any occur, contact your healthcare provider right away.  Surgery. This is used if the hernia is larger or you have symptoms.  Open surgery. This is usually an outpatient procedure (you will not stay overnight in a hospital). An cut (incision) is made through the skin in the groin. The hernia is put back inside the abdomen. The weak area in the muscles is then repaired by herniorrhaphy or hernioplasty. Herniorrhaphy: in this type of surgery, the weak  muscles are sewn back together. Hernioplasty: a patch or mesh is used to close the weak area in the abdominal wall.  Laparoscopy. In this procedure, a surgeon makes small incisions. A thin tube with a tiny video camera (called a laparoscope) is put into the abdomen. The surgeon repairs the hernia with mesh by looking with the video camera and using two long instruments. HOME CARE INSTRUCTIONS   After surgery to repair an inguinal hernia:  You will need to take pain medicine prescribed by your healthcare provider. Follow all directions carefully.  You will need to take care of the wound from the incision.  Your activity will be restricted for awhile. This will probably include no heavy lifting for several weeks. You also should not do anything too active for a few weeks. When you can return to work will depend on the type of job that you have.  During "watchful waiting" periods, you should:  Maintain a healthy weight.  Eat a diet high in fiber (fruits, vegetables and whole grains).  Drink plenty of fluids to avoid constipation. This means drinking enough water and other liquids to keep your urine clear or pale yellow.  Do not lift heavy objects.  Do not stand for long periods of time.  Quit smoking. This should keep you from developing a frequent cough. SEEK MEDICAL CARE IF:   A bulge develops in your groin area.  You feel pain, a burning sensation or pressure in the groin. This might be worse if you are lifting or straining.  You develop a fever of more than 100.5 F (38.1 C). SEEK IMMEDIATE MEDICAL CARE IF:   Pain in the groin increases suddenly.  A bulge in the groin gets bigger suddenly and does not go down.  For men, there is sudden pain in the scrotum. Or, the size of the scrotum increases.  A bulge in the groin area becomes red or purple and is painful to touch.  You have nausea or vomiting that does not go away.  You feel your heart beating much faster than  normal.  You cannot have a bowel movement or pass gas.  You develop a fever of more than 102.0 F (38.9 C).   This information is not intended to replace advice given to you by your health care provider. Make sure you discuss any questions you have with your health care provider.   Document Released: 10/23/2008 Document Revised: 08/29/2011 Document Reviewed: 12/08/2014 Elsevier Interactive Patient Education [employer].

## 2016-05-25 NOTE — Progress Notes (Signed)
05/25/2016  HPI: Patient underwent a diagnostic laparoscopy on 11/28. The initial surgery was supposed to be a laparoscopic left anal hernia repair. However after ports were placed and pneumoperitoneum was obtained and the patient was placed in Trendelenburg position, the patient had significant desaturation event. After relieving the pneumoperitoneum and placing him back in flat supine position, his oxygen saturation improved and normalized. However after a second attempt at surgery the same happened and does the surgery was aborted, the ports were removed and his incisions were closed.  Chest x-ray in the recovery room revealed clear lungs but with cardiomegaly. In light of this the patient was referred to both cardiology and pulmonology for evaluation for clearance for surgery. The patient was seen by Dr. Okey DupreEnd with cardiology and the patient is due for an echocardiogram on 12/11. He will see Dr. Sung AmabileSimonds with pulmonology tomorrow as well.  Currently the patient reports some mild soreness over the incisions and that he has discomfort with sitting for prolonged periods of time due to the pressure over the incisions. Otherwise denies any nausea, vomiting, fevers, chills.  Vital signs: BP 137/87   Pulse 72   Temp 98.2 F (36.8 C) (Oral)   Ht 5\' 11"  (1.803 m)   Wt 116.2 kg (256 lb 3.2 oz)   BMI 35.73 kg/m    Physical Exam: Constitutional: No acute distress  Abdomen: Soft, obese, nondistended, with no tenderness to palpation over the incisions. All 3 incisions are clean dry and intact without any evidence of infection.  Assessment/Plan: 45 year old male status post aborted laparoscopic left inguinal hernia repair proceeding only with a diagnostic laparoscopy due to option desaturation event 2.   -Had a long discussion with the patient regarding the events that happened during the surgery day as well as the reasoning behind having him being seen by cardiology and pulmonology. Patient understands  that this is being done for his safety in order to assess for any potential issues that could happen during surgery and the best way to prevent them. The patient does understand as well that a laparoscopic procedure will not be attempted again for his hernia repair and instead we would proceed with an open left inguinal hernia repair as long as he is cleared by cardiology and pulmonology. I did go over with the patient the recovery process and potential issues with an open procedure and he shows understanding of this.  -At this point will preemptively book the patient for an open left inguinal hernia repair on 12/19, pending clearance by both cardiology and pulmonology.    Howie IllJose Luis Oluwatomisin Hustead, MD [facility]

## 2016-05-26 ENCOUNTER — Ambulatory Visit (INDEPENDENT_AMBULATORY_CARE_PROVIDER_SITE_OTHER): Payer: 59 | Admitting: Pulmonary Disease

## 2016-05-26 ENCOUNTER — Encounter: Payer: Self-pay | Admitting: Pulmonary Disease

## 2016-05-26 ENCOUNTER — Other Ambulatory Visit: Payer: Self-pay | Admitting: *Deleted

## 2016-05-26 VITALS — BP 126/74 | HR 77 | Ht 71.0 in | Wt 257.6 lb

## 2016-05-26 DIAGNOSIS — I10 Essential (primary) hypertension: Secondary | ICD-10-CM

## 2016-05-26 DIAGNOSIS — R0902 Hypoxemia: Secondary | ICD-10-CM | POA: Diagnosis not present

## 2016-05-26 NOTE — Patient Instructions (Signed)
I will follow up on the echocardiogram with bubble study that Dr End has ordered  Full lung function test next week - we will call you with the results  There is probably going to be no reason to postpone surgery planned 06/07/16

## 2016-05-27 ENCOUNTER — Telehealth: Payer: Self-pay | Admitting: Surgery

## 2016-05-27 NOTE — Telephone Encounter (Signed)
Pt advised of pre op date/time and sx date. Sx: 06/09/16 with Dr Macarthur CritchleyPiscoya--Open left inguinal hernia repair.  Pre op: No pre op due to previous pre op appointment completed.   Patient made aware to call [phone], between 1-3:00pm the day before surgery, to find out what time to arrive.

## 2016-05-28 NOTE — Progress Notes (Signed)
PULMONARY CONSULT NOTE  Requesting MD/Service: Everlene OtherJayce Cook, MD Date of initial consultation: 05/26/16 Reason for consultation: intra-operative oxygen desaturation  PT PROFILE: 45 y.o. M never smoker with no prior pulmonary history suffered recurrent oxygen desaturation during surgery for elective L inguinal hernia repair causing cancellation of the surgery and referral to Pulmonary and Cardiology   HPI:  As above. The circumstances are well described in Dr Adelene IdlerPiscoya's op note from 05/17/16. He underwent induction anesthesia and the laparoscope was introduced. In preparation for the repair, his abdomen was insufflated with CO2 and he was placed in Trendelenburg position which resulted in desaturation into the 80s. His abdomen was decompressed and he was taken out of Trendelenburg position. He underwent alveolar recruitment maneuvers and his oxygen saturations improved. Again the procedure was attempted and desaturation recurred. At that point, the procedure was aborted and his course since then has been uneventful. He denies any respiratory or pulmonary symptoms. He has never smoked and he has no significant occupational exposures   Past Medical History:  Diagnosis Date  . Abscess    Peri-rectal abscess  . Chicken pox   . Difficult intubation   . ED (erectile dysfunction)   . GERD (gastroesophageal reflux disease)   . Hernia, inguinal, left   . Hyperlipidemia   . Hypertension   . Migraine   . Sleep apnea    BIPAP  . Urinary retention     Past Surgical History:  Procedure Laterality Date  . ABSCESS DRAINAGE    . BACK SURGERY  1999   L-5  . CHOLECYSTECTOMY    . INCISION AND DRAINAGE ABSCESS N/A 11/12/2015   Procedure: INCISION AND DRAINAGE ABSCESS;  Surgeon: Leafy Roiego F Pabon, MD;  Location: [facility];  Service: General;  Laterality: N/A;  . LAPAROSCOPY  05/17/2016   Procedure: LAPAROSCOPY DIAGNOSTIC;  Surgeon: Henrene DodgeJose Piscoya, MD;  Location: [facility];  Service: General;;  . NECK FUSION  2013   . ROTATOR CUFF REPAIR Left   . TONSILLECTOMY AND ADENOIDECTOMY      MEDICATIONS: I have reviewed all medications and confirmed regimen as documented  Social History   Social History  . Marital status: Divorced    Spouse name: N/A  . Number of children: 4  . Years of education: N/A   Occupational History  . truck driver    Social History Main Topics  . Smoking status: Former Smoker    Packs/day: 0.50    Years: 10.00    Types: Cigarettes    Quit date: 05/11/1996  . Smokeless tobacco: Never Used  . Alcohol use 1.8 oz/week    1 Glasses of wine, 2 Cans of beer per week     Comment: occ  . Drug use: No  . Sexual activity: Yes    Partners: Female   Other Topics Concern  . Not on file   Social History Narrative  . No narrative on file    Family History  Problem Relation Age of Onset  . Stomach cancer Father     spread to lungs  . Hypertension Mother   . Hyperlipidemia Mother   . Prostate cancer Maternal Grandfather     ROS: No fever, myalgias/arthralgias, unexplained weight loss or weight gain No new focal weakness or sensory deficits No otalgia, hearing loss, visual changes, nasal and sinus symptoms, mouth and throat problems No neck pain or adenopathy No abdominal pain, N/V/D, diarrhea, change in bowel pattern No dysuria, change in urinary pattern   Vitals:   05/26/16 0903  BP: 126/74  Pulse: 77  SpO2: 99%  Weight: 257 lb 9.6 oz (116.8 kg)  Height: 5\' 11"  (1.803 m)     EXAM:  Gen: Mildly-moderately obese, relatively muscular, No overt respiratory distress HEENT: NCAT, sclera white, oropharynx normal Neck: Supple without LAN, thyromegaly, JVD Lungs: breath sounds: full, percussion: norma;, No adventitious sounds Cardiovascular: RRR, no murmurs noted Abdomen: Soft, nontender, normal BS Ext: without clubbing, cyanosis, edema Neuro: CNs grossly intact, motor and sensory intact Skin: Limited exam, no lesions noted  DATA:   BMP Latest Ref Rng &  Units 05/16/2016 03/23/2016 01/18/2016  Glucose 70 - 99 mg/dL - 85 91  BUN 6 - 23 mg/dL - 12 11  Creatinine 1.610.40 - 1.50 mg/dL - 0.960.78 0.450.94  BUN/Creat Ratio 9 - 20 - - 12  Sodium 135 - 145 mEq/L - 138 140  Potassium 3.5 - 5.1 mmol/L 3.5 3.5 3.8  Chloride 96 - 112 mEq/L - 99 98  CO2 19 - 32 mEq/L - 31 24  Calcium 8.4 - 10.5 mg/dL - 9.6 9.4    CBC Latest Ref Rng & Units 03/23/2016 11/15/2015 11/14/2015  WBC 4.0 - 10.5 K/uL 7.7 8.5 10.9(H)  Hemoglobin 13.0 - 17.0 g/dL 40.913.6 10.5(L) 11.4(L)  Hematocrit 39.0 - 52.0 % 40.0 30.5(L) 33.5(L)  Platelets 150.0 - 400.0 K/uL 333.0 285 269    CXR (05/17/16): [facility]   EKG 05/24/16:  normal CTA chest 06/12/10: no abnormalities noted. There was no evidence of vascular lesions   IMPRESSION:   Intra-operative desaturation - Refractory hypoxemia despite 100% FiO2 is, by definition, R>L shunt physiology. The fact that is occurred during insufflation of the abdomen and while in Trendelenburg position suggests the shunt fraction varies with positional changes - possibly due to changes in regional blood flow in the lungs or alterations through an intracardiac shunt (ie. anatomic shunt). Alternative, the insufflation and positioning might simply have caused sufficient atelectasis to create physiological shunt   PLAN:  1) Echocardiogram with bubble study has already been ordered by Dr End (Cards) 2) I do not see a reason to repeat CTA of chest unless bubble study reveals delayed appearance of bubbles in LA. This is noting the absence of evidence of a pulmonary AVM on prior CTA chest in 2011 3) Full PFTs next week 4) Noted from Dr Aleen CampiPiscoya indicate a plan to proceed with surgery 12/19 using an open approach which will obviate the need for Trendelenburg positioning and intra-abdominal insufflation 5) It seems unlikely that we will uncover a reason that we will have to postpone that surgery 6) I have not scheduled follow up with Mr Amie CritchleyBlackwell but we will arrange as  needed   Billy Fischeravid Tyffani Foglesong, MD PCCM service Mobile [phone] Pager [phone] 05/28/2016

## 2016-05-30 ENCOUNTER — Ambulatory Visit: Payer: Federal, State, Local not specified - PPO

## 2016-05-30 ENCOUNTER — Ambulatory Visit
Admission: RE | Admit: 2016-05-30 | Discharge: 2016-05-30 | Disposition: A | Discharge status: Home / Self Care | Payer: Federal, State, Local not specified - PPO | Source: Ambulatory Visit | Attending: Internal Medicine | Admitting: Internal Medicine

## 2016-05-30 DIAGNOSIS — I10 Essential (primary) hypertension: Secondary | ICD-10-CM | POA: Diagnosis not present

## 2016-05-30 DIAGNOSIS — I119 Hypertensive heart disease without heart failure: Secondary | ICD-10-CM | POA: Insufficient documentation

## 2016-05-30 DIAGNOSIS — I340 Nonrheumatic mitral (valve) insufficiency: Secondary | ICD-10-CM | POA: Insufficient documentation

## 2016-05-30 DIAGNOSIS — R0600 Dyspnea, unspecified: Secondary | ICD-10-CM | POA: Insufficient documentation

## 2016-05-30 HISTORY — PX: TRANSTHORACIC ECHOCARDIOGRAM: SHX275

## 2016-05-30 NOTE — Progress Notes (Signed)
*  PRELIMINARY RESULTS* Echocardiogram 2D Echocardiogram has been performed.  Cristela BlueHege, Fusako Tanabe 05/30/2016, 10:57 AM

## 2016-06-02 ENCOUNTER — Other Ambulatory Visit: Payer: Self-pay | Admitting: Internal Medicine

## 2016-06-02 ENCOUNTER — Other Ambulatory Visit: Payer: Self-pay | Admitting: Pulmonary Disease

## 2016-06-02 ENCOUNTER — Other Ambulatory Visit: Payer: Self-pay

## 2016-06-02 DIAGNOSIS — R0902 Hypoxemia: Secondary | ICD-10-CM

## 2016-06-07 ENCOUNTER — Ambulatory Visit: Payer: Federal, State, Local not specified - PPO | Attending: Pulmonary Disease

## 2016-06-07 DIAGNOSIS — R0902 Hypoxemia: Secondary | ICD-10-CM

## 2016-06-09 ENCOUNTER — Encounter: Payer: Self-pay | Admitting: *Deleted

## 2016-06-09 ENCOUNTER — Encounter
Admission: RE | Disposition: A | Discharge status: Home / Self Care | Payer: Self-pay | Source: Ambulatory Visit | Attending: Surgery

## 2016-06-09 ENCOUNTER — Ambulatory Visit
Admission: RE | Admit: 2016-06-09 | Discharge: 2016-06-09 | Disposition: A | Discharge status: Home / Self Care | Payer: Federal, State, Local not specified - PPO | Source: Ambulatory Visit | Attending: Surgery | Admitting: Surgery

## 2016-06-09 ENCOUNTER — Ambulatory Visit: Payer: Federal, State, Local not specified - PPO | Admitting: Anesthesiology

## 2016-06-09 DIAGNOSIS — G43909 Migraine, unspecified, not intractable, without status migrainosus: Secondary | ICD-10-CM | POA: Insufficient documentation

## 2016-06-09 DIAGNOSIS — K4091 Unilateral inguinal hernia, without obstruction or gangrene, recurrent: Secondary | ICD-10-CM | POA: Insufficient documentation

## 2016-06-09 DIAGNOSIS — E785 Hyperlipidemia, unspecified: Secondary | ICD-10-CM | POA: Diagnosis not present

## 2016-06-09 DIAGNOSIS — G4730 Sleep apnea, unspecified: Secondary | ICD-10-CM | POA: Insufficient documentation

## 2016-06-09 DIAGNOSIS — Z888 Allergy status to other drugs, medicaments and biological substances status: Secondary | ICD-10-CM | POA: Insufficient documentation

## 2016-06-09 DIAGNOSIS — I10 Essential (primary) hypertension: Secondary | ICD-10-CM | POA: Diagnosis not present

## 2016-06-09 DIAGNOSIS — N529 Male erectile dysfunction, unspecified: Secondary | ICD-10-CM | POA: Diagnosis not present

## 2016-06-09 DIAGNOSIS — K219 Gastro-esophageal reflux disease without esophagitis: Secondary | ICD-10-CM | POA: Diagnosis not present

## 2016-06-09 HISTORY — PX: INGUINAL HERNIA REPAIR: SHX194

## 2016-06-09 HISTORY — DX: Other complications of anesthesia, initial encounter: T88.59XA

## 2016-06-09 HISTORY — DX: Adverse effect of unspecified anesthetic, initial encounter: T41.45XA

## 2016-06-09 SURGERY — REPAIR, HERNIA, INGUINAL, ADULT
Anesthesia: General | Laterality: Left | Wound class: Clean

## 2016-06-09 MED ORDER — MIDAZOLAM HCL 2 MG/2ML IJ SOLN
INTRAMUSCULAR | Status: DC | PRN
  Administered 2016-06-09: 2 mg via INTRAVENOUS

## 2016-06-09 MED ORDER — FENTANYL CITRATE (PF) 100 MCG/2ML IJ SOLN
INTRAMUSCULAR | Status: AC
  Filled 2016-06-09: qty 2

## 2016-06-09 MED ORDER — CEFAZOLIN SODIUM-DEXTROSE 2-4 GM/100ML-% IV SOLN
2.0000 g | INTRAVENOUS | Status: AC
  Administered 2016-06-09: 2 g via INTRAVENOUS

## 2016-06-09 MED ORDER — CEFAZOLIN SODIUM-DEXTROSE 2-4 GM/100ML-% IV SOLN
INTRAVENOUS | Status: AC
  Filled 2016-06-09: qty 100

## 2016-06-09 MED ORDER — ACETAMINOPHEN 500 MG PO TABS
ORAL_TABLET | ORAL | Status: AC
  Filled 2016-06-09: qty 2

## 2016-06-09 MED ORDER — EPHEDRINE 5 MG/ML INJ
INTRAVENOUS | Status: AC
  Filled 2016-06-09: qty 10

## 2016-06-09 MED ORDER — OXYCODONE HCL 5 MG PO TABS: 5.0000 mg | ORAL_TABLET | Freq: Four times a day (QID) | ORAL | 0 refills | Status: DC | PRN

## 2016-06-09 MED ORDER — KETOROLAC TROMETHAMINE 30 MG/ML IJ SOLN
INTRAMUSCULAR | Status: AC
  Filled 2016-06-09: qty 1

## 2016-06-09 MED ORDER — KETOROLAC TROMETHAMINE 30 MG/ML IJ SOLN
INTRAMUSCULAR | Status: DC | PRN
  Administered 2016-06-09: 30 mg via INTRAVENOUS

## 2016-06-09 MED ORDER — PHENYLEPHRINE 40 MCG/ML (10ML) SYRINGE FOR IV PUSH (FOR BLOOD PRESSURE SUPPORT)
PREFILLED_SYRINGE | INTRAVENOUS | Status: DC | PRN
  Administered 2016-06-09 (×5): 80 ug via INTRAVENOUS

## 2016-06-09 MED ORDER — CHLORHEXIDINE GLUCONATE CLOTH 2 % EX PADS: 6.0000 | MEDICATED_PAD | Freq: Once | CUTANEOUS | Status: DC

## 2016-06-09 MED ORDER — LACTATED RINGERS IV SOLN
INTRAVENOUS | Status: DC | PRN
  Administered 2016-06-09 (×2): via INTRAVENOUS

## 2016-06-09 MED ORDER — ONDANSETRON HCL 4 MG/2ML IJ SOLN
INTRAMUSCULAR | Status: AC
  Filled 2016-06-09: qty 2

## 2016-06-09 MED ORDER — BUPIVACAINE LIPOSOME 1.3 % IJ SUSP
INTRAMUSCULAR | Status: DC | PRN
  Administered 2016-06-09: 20 mL

## 2016-06-09 MED ORDER — ACETAMINOPHEN 500 MG PO TABS
1000.0000 mg | ORAL_TABLET | ORAL | Status: AC
  Administered 2016-06-09: 1000 mg via ORAL

## 2016-06-09 MED ORDER — SUCCINYLCHOLINE CHLORIDE 20 MG/ML IJ SOLN
INTRAMUSCULAR | Status: DC | PRN
  Administered 2016-06-09: 120 mg via INTRAVENOUS

## 2016-06-09 MED ORDER — PROPOFOL 10 MG/ML IV BOLUS
INTRAVENOUS | Status: AC
  Filled 2016-06-09: qty 20

## 2016-06-09 MED ORDER — PHENYLEPHRINE 40 MCG/ML (10ML) SYRINGE FOR IV PUSH (FOR BLOOD PRESSURE SUPPORT)
PREFILLED_SYRINGE | INTRAVENOUS | Status: AC
  Filled 2016-06-09: qty 10

## 2016-06-09 MED ORDER — DEXAMETHASONE SODIUM PHOSPHATE 10 MG/ML IJ SOLN
INTRAMUSCULAR | Status: AC
  Filled 2016-06-09: qty 1

## 2016-06-09 MED ORDER — BUPIVACAINE-EPINEPHRINE 0.25% -1:200000 IJ SOLN
INTRAMUSCULAR | Status: DC | PRN
  Administered 2016-06-09: 30 mL

## 2016-06-09 MED ORDER — IBUPROFEN 600 MG PO TABS: 600.0000 mg | ORAL_TABLET | Freq: Three times a day (TID) | ORAL | 0 refills | Status: DC | PRN

## 2016-06-09 MED ORDER — ACETAMINOPHEN 10 MG/ML IV SOLN
INTRAVENOUS | Status: DC | PRN
  Administered 2016-06-09: 1000 mg via INTRAVENOUS

## 2016-06-09 MED ORDER — GABAPENTIN 300 MG PO CAPS
300.0000 mg | ORAL_CAPSULE | ORAL | Status: AC
  Administered 2016-06-09: 300 mg via ORAL

## 2016-06-09 MED ORDER — LIDOCAINE 2% (20 MG/ML) 5 ML SYRINGE
INTRAMUSCULAR | Status: AC
  Filled 2016-06-09: qty 5

## 2016-06-09 MED ORDER — ONDANSETRON HCL 4 MG/2ML IJ SOLN
INTRAMUSCULAR | Status: DC | PRN
  Administered 2016-06-09: 4 mg via INTRAVENOUS

## 2016-06-09 MED ORDER — EPHEDRINE SULFATE 50 MG/ML IJ SOLN
INTRAMUSCULAR | Status: DC | PRN
  Administered 2016-06-09 (×7): 10 mg via INTRAVENOUS

## 2016-06-09 MED ORDER — GABAPENTIN 300 MG PO CAPS
ORAL_CAPSULE | ORAL | Status: AC
  Filled 2016-06-09: qty 1

## 2016-06-09 MED ORDER — ONDANSETRON HCL 4 MG/2ML IJ SOLN: 4.0000 mg | Freq: Once | INTRAMUSCULAR | Status: DC | PRN

## 2016-06-09 MED ORDER — PROPOFOL 10 MG/ML IV BOLUS
INTRAVENOUS | Status: DC | PRN
  Administered 2016-06-09: 20 mg via INTRAVENOUS
  Administered 2016-06-09: 200 mg via INTRAVENOUS
  Administered 2016-06-09: 50 mg via INTRAVENOUS
  Administered 2016-06-09: 30 mg via INTRAVENOUS

## 2016-06-09 MED ORDER — ACETAMINOPHEN 10 MG/ML IV SOLN
INTRAVENOUS | Status: AC
  Filled 2016-06-09: qty 100

## 2016-06-09 MED ORDER — DEXAMETHASONE SODIUM PHOSPHATE 10 MG/ML IJ SOLN
INTRAMUSCULAR | Status: DC | PRN
  Administered 2016-06-09: 10 mg via INTRAVENOUS

## 2016-06-09 MED ORDER — SEVOFLURANE IN SOLN
RESPIRATORY_TRACT | Status: AC
  Filled 2016-06-09: qty 250

## 2016-06-09 MED ORDER — FENTANYL CITRATE (PF) 100 MCG/2ML IJ SOLN: 25.0000 ug | INTRAMUSCULAR | Status: DC | PRN

## 2016-06-09 MED ORDER — LIDOCAINE HCL (CARDIAC) 20 MG/ML IV SOLN
INTRAVENOUS | Status: DC | PRN
  Administered 2016-06-09: 100 mg via INTRAVENOUS

## 2016-06-09 MED ORDER — BUPIVACAINE-EPINEPHRINE (PF) 0.25% -1:200000 IJ SOLN
INTRAMUSCULAR | Status: AC
  Filled 2016-06-09: qty 30

## 2016-06-09 MED ORDER — MIDAZOLAM HCL 2 MG/2ML IJ SOLN
INTRAMUSCULAR | Status: AC
  Filled 2016-06-09: qty 2

## 2016-06-09 MED ORDER — FENTANYL CITRATE (PF) 100 MCG/2ML IJ SOLN
INTRAMUSCULAR | Status: DC | PRN
Start: 2016-06-09 — End: 2016-06-09
  Administered 2016-06-09 (×3): 50 ug via INTRAVENOUS
  Administered 2016-06-09: 100 ug via INTRAVENOUS
  Administered 2016-06-09 (×2): 50 ug via INTRAVENOUS

## 2016-06-09 SURGICAL SUPPLY — 40 items
BLADE CLIPPER SURG (BLADE) ×2 IMPLANT
BLADE SURG 15 STRL LF DISP TIS (BLADE) ×1 IMPLANT
BLADE SURG 15 STRL SS (BLADE) ×1
CANISTER SUCT 1200ML W/VALVE (MISCELLANEOUS) ×2 IMPLANT
CHLORAPREP W/TINT 26ML (MISCELLANEOUS) ×2 IMPLANT
DERMABOND ADVANCED (GAUZE/BANDAGES/DRESSINGS) ×1
DERMABOND ADVANCED .7 DNX12 (GAUZE/BANDAGES/DRESSINGS) ×1 IMPLANT
DRAIN PENROSE 1/4X12 LTX (DRAIN) ×2 IMPLANT
DRAPE LAPAROTOMY 100X77 ABD (DRAPES) ×2 IMPLANT
DRSG TEGADERM 4X4.75 (GAUZE/BANDAGES/DRESSINGS) ×4 IMPLANT
ELECT CAUTERY BLADE 6.4 (BLADE) ×2 IMPLANT
ELECT REM PT RETURN 9FT ADLT (ELECTROSURGICAL) ×2
ELECTRODE REM PT RTRN 9FT ADLT (ELECTROSURGICAL) ×1 IMPLANT
GAUZE SPONGE 4X4 12PLY STRL (GAUZE/BANDAGES/DRESSINGS) ×2 IMPLANT
GLOVE SURG SYN 7.0 (GLOVE) ×2 IMPLANT
GLOVE SURG SYN 7.5  E (GLOVE) ×1
GLOVE SURG SYN 7.5 E (GLOVE) ×1 IMPLANT
GOWN STRL REUS W/ TWL LRG LVL3 (GOWN DISPOSABLE) ×2 IMPLANT
GOWN STRL REUS W/TWL LRG LVL3 (GOWN DISPOSABLE) ×2
LABEL OR SOLS (LABEL) ×2 IMPLANT
MESH HERNIA 1.6X1.9 PLUG LRG (Mesh General) ×1 IMPLANT
MESH HERNIA PLUG LRG (Mesh General) ×1 IMPLANT
NDL SAFETY 22GX1.5 (NEEDLE) ×2 IMPLANT
NS IRRIG 500ML POUR BTL (IV SOLUTION) ×2 IMPLANT
PACK BASIN MINOR ARMC (MISCELLANEOUS) ×2 IMPLANT
SPONGE LAP 18X18 5 PK (GAUZE/BANDAGES/DRESSINGS) IMPLANT
SPONGE XRAY 4X4 16PLY STRL (MISCELLANEOUS) ×4 IMPLANT
STAPLER SKIN PROX 35W (STAPLE) ×2 IMPLANT
SUT ETHIBOND 0 MO6 C/R (SUTURE) ×4 IMPLANT
SUT MNCRL 4-0 (SUTURE) ×1
SUT MNCRL 4-0 27XMFL (SUTURE) ×1
SUT PROLENE 2 0 SH DA (SUTURE) ×8 IMPLANT
SUT VIC AB 2-0 CT1 (SUTURE) ×2 IMPLANT
SUT VIC AB 3-0 SH 27 (SUTURE) ×2
SUT VIC AB 3-0 SH 27X BRD (SUTURE) ×2 IMPLANT
SUTURE MNCRL 4-0 27XMF (SUTURE) ×1 IMPLANT
SYR 20CC LL (SYRINGE) ×2 IMPLANT
SYR BULB IRRIG 60ML STRL (SYRINGE) ×2 IMPLANT
SYRINGE 10CC LL (SYRINGE) ×2 IMPLANT
TRAY FOLEY W/METER SILVER 16FR (SET/KITS/TRAYS/PACK) ×2 IMPLANT

## 2016-06-09 NOTE — Unmapped (Signed)
Formatting of this note might be different from the original.    Procedure Date:  06/09/2016    Pre-operative Diagnosis:  Recurrent left inguinal hernia    Post-operative Diagnosis: Recurrent left inguinal hernia    Procedure:  Left Inguinal Hernia Repair with mesh    Surgeon:  Howie Ill, MD    Assistant:  Ricarda Frame, MD    Anesthesia:  General endotracheal    Estimated Blood Loss:  30 ml    Specimens:  None    Complications:  Left lower quadrant fascial defect requiring repair    Indications for Procedure:  This is a 45 y.o. male who presents with a recurrent left inguinal hernia.  Laparoscopic repair was attempted on 11/28 but the patient suffered a desaturation event that required abortion of the case. After workup with cardiology and pulmonology the patient is noted to have mild atrial enlargement and a small mitral regurgitation but otherwise normal PFTs. Subsequently the plan was to attempt this repair in open fashion. The risks of bleeding, abscess or infection, recurrence of symptoms, potential for an open procedure, injury to surrounding structures, and chronic pain were all discussed with the patient and was willing to proceed.    Description of Procedure:  The patient was correctly identified in the preoperative area and brought into the operating room.  The patient was placed supine with VTE prophylaxis in place.  Appropriate time-outs were performed.  Anesthesia was induced and the patient was intubated.  Foley catheter was placed.  Appropriate antibiotics were infused.    The abdomen was prepped and draped in a sterile fashion. An incision was made going from the pubic symphysis obliquely towards the ASIS. Electrocautery was used to dissect down the subcutaneous layers until reaching the external oblique aponeurosis. The aponeurosis was incised and immediately was noted to be significantly scarred to all the layers posterior to it.  The incision was extended down towards the external ring  and laterally towards the ASIS. After further dissection with Metzenbaum scissors and electrocautery it was noted that due to the severe scarring of the layers that a new fascial defect had been created with preperitoneal fat bulging out. At that point I called Dr. Tonita Cong to come assist as the layers of the abdominal wall were very difficult to separate.    The cord structures were identified and dissected free of the scar tissue from his previous repair. It was isolated using a Penrose drain. The hernia sac was then dissected free from the cord structures preserving the vas deferens and the testicular vessels. The hernia sac containing fat was reduced.  The opening was approximated using 3 interrupted sutures of Prolene.     The fascial defect that had been created was then closed using interrupted 0 Ethibond sutures in 2 layers as it required reinforcement. Then a large Bard mesh PerFix was sutured using 2-0 Prolene to the pubic tubercle at its apex and then in running fashion using 2-0 Prolene across the medial aspect going to the conjoin tendon and the previous defect repair, thus reinforcing our repair with mesh. The already made slit for the cord was extended in order to fit the cord better and the lateral portion of the mesh was sutured towards the shelving edge using interrupted Prolene sutures.  The tails of the mesh were sutured together across the cord allowing for enough space for our forceps between the mesh and the cord. The cavity was then thoroughly irrigated and the aponeurosis of the external  oblique was closed using 2-0 Vicryl in running fashion.    50 mL of external solution were infused subcutaneously and through the fascia in multiple points for pain control. The subcutaneous tissue was then approximated using interrupted 3-0 Vicryl sutures and the skin was closed using staples. The wound was then cleaned and dried and dressed sterilely with gauze and Tegaderm.    Foley catheter was removed.   Bilateral scroti were checked and both testicles were descended.  The patient was emerged from anesthesia and extubated and brought to the recovery room for further management.    The patient tolerated the procedure well and all counts were correct at the end of the case.    Howie Ill, MD  Electronically signed by Henrene Dodge, MD at 06/09/2016  7:11 PM EST

## 2016-06-09 NOTE — Interval H&P Note (Signed)
Formatting of this note might be different from the original.  History and Physical Interval Note:    06/09/2016  2:41 PM    Jason Greene  has presented today for surgery, with the diagnosis of recurrent left inguinal hernia  The various methods of treatment have been discussed with the patient and family. After consideration of risks, benefits and other options for treatment, the patient has consented to  Procedure(s):  HERNIA REPAIR INGUINAL ADULT (Left) as a surgical intervention .  The patient's history has been reviewed, patient examined, no change in status, stable for surgery.  I have reviewed the patient's chart and labs.  Questions were answered to the patient's satisfaction.      [facility]      Electronically signed by Henrene Dodge, MD at 06/09/2016  2:41 PM EST    Source Note - Henrene Dodge, MD - 05/25/2016 11:00 AM EST  Formatting of this note is different from the original.  05/25/2016    HPI:  Patient underwent a diagnostic laparoscopy on 11/28. The initial surgery was supposed to be a laparoscopic left anal hernia repair. However after ports were placed and pneumoperitoneum was obtained and the patient was placed in Trendelenburg position, the patient had significant desaturation event. After relieving the pneumoperitoneum and placing him back in flat supine position, his oxygen saturation improved and normalized. However after a second attempt at surgery the same happened and does the surgery was aborted, the ports were removed and his incisions were closed.    Chest x-ray in the recovery room revealed clear lungs but with cardiomegaly. In light of this the patient was referred to both cardiology and pulmonology for evaluation for clearance for surgery. The patient was seen by Dr. Okey Dupre with cardiology and the patient is due for an echocardiogram on 12/11. He will see Dr. Sung Amabile with pulmonology tomorrow as well.    Currently the patient reports some mild soreness over the incisions and that  he has discomfort with sitting for prolonged periods of time due to the pressure over the incisions. Otherwise denies any nausea, vomiting, fevers, chills.    Vital signs:  BP 137/87   Pulse 72   Temp 98.2 F (36.8 C) (Oral)   Ht 5\' 11"  (1.803 m)   Wt 116.2 kg (256 lb 3.2 oz)   BMI 35.73 kg/m      Physical Exam:  Constitutional: No acute distress   Abdomen: Soft, obese, nondistended, with no tenderness to palpation over the incisions. All 3 incisions are clean dry and intact without any evidence of infection.    Assessment/Plan:  45 year old male status post aborted laparoscopic left inguinal hernia repair proceeding only with a diagnostic laparoscopy due to option desaturation event 2.     -Had a long discussion with the patient regarding the events that happened during the surgery day as well as the reasoning behind having him being seen by cardiology and pulmonology. Patient understands that this is being done for his safety in order to assess for any potential issues that could happen during surgery and the best way to prevent them. The patient does understand as well that a laparoscopic procedure will not be attempted again for his hernia repair and instead we would proceed with an open left inguinal hernia repair as long as he is cleared by cardiology and pulmonology. I did go over with the patient the recovery process and potential issues with an open procedure and he shows understanding of this.   -At  this point will preemptively book the patient for an open left inguinal hernia repair on 12/19, pending clearance by both cardiology and pulmonology.     Howie Ill, MD  [facility]    Electronically signed by Henrene Dodge, MD at 05/25/2016 12:21 PM EST

## 2016-06-09 NOTE — Op Note (Signed)
Procedure Date:  06/09/2016  Pre-operative Diagnosis:  Recurrent left inguinal hernia  Post-operative Diagnosis: Recurrent left inguinal hernia  Procedure:  Left Inguinal Hernia Repair with mesh  Surgeon:  Howie IllJose Luis Alante Tolan, MD  Assistant:  Curtis Frameharles Woodham, MD  Anesthesia:  General endotracheal  Estimated Blood Loss:  30 ml  Specimens:  None  Complications:  Left lower quadrant fascial defect requiring repair  Indications for Procedure:  This is a 45 y.o. male who presents with a recurrent left inguinal hernia.  Laparoscopic repair was attempted on 11/28 but the patient suffered a desaturation event that required abortion of the case. After workup with cardiology and pulmonology the patient is noted to have mild atrial enlargement and a small mitral regurgitation but otherwise normal PFTs. Subsequently the plan was to attempt this repair in open fashion. The risks of bleeding, abscess or infection, recurrence of symptoms, potential for an open procedure, injury to surrounding structures, and chronic pain were all discussed with the patient and was willing to proceed.  Description of Procedure: The patient was correctly identified in the preoperative area and brought into the operating room.  The patient was placed supine with VTE prophylaxis in place.  Appropriate time-outs were performed.  Anesthesia was induced and the patient was intubated.  Foley catheter was placed.  Appropriate antibiotics were infused.  The abdomen was prepped and draped in a sterile fashion. An incision was made going from the pubic symphysis obliquely towards the ASIS. Electrocautery was used to dissect down the subcutaneous layers until reaching the external oblique aponeurosis. The aponeurosis was incised and immediately was noted to be significantly scarred to all the layers posterior to it.  The incision was extended down towards the external ring and laterally towards the ASIS. After further dissection with  Metzenbaum scissors and electrocautery it was noted that due to the severe scarring of the layers that a new fascial defect had been created with preperitoneal fat bulging out. At that point I called Dr. Tonita Harper to come assist as the layers of the abdominal wall were very difficult to separate.  The cord structures were identified and dissected free of the scar tissue from his previous repair. It was isolated using a Penrose drain. The hernia sac was then dissected free from the cord structures preserving the vas deferens and the testicular vessels. The hernia sac containing fat was reduced.  The opening was approximated using 3 interrupted sutures of Prolene.   The fascial defect that had been created was then closed using interrupted 0 Ethibond sutures in 2 layers as it required reinforcement. Then a large Bard mesh PerFix was sutured using 2-0 Prolene to the pubic tubercle at its apex and then in running fashion using 2-0 Prolene across the medial aspect going to the conjoin tendon and the previous defect repair, thus reinforcing our repair with mesh. The already made slit for the cord was extended in order to fit the cord better and the lateral portion of the mesh was sutured towards the shelving edge using interrupted Prolene sutures.  The tails of the mesh were sutured together across the cord allowing for enough space for our forceps between the mesh and the cord. The cavity was then thoroughly irrigated and the aponeurosis of the external oblique was closed using 2-0 Vicryl in running fashion.  50 mL of external solution were infused subcutaneously and through the fascia in multiple points for pain control. The subcutaneous tissue was then approximated using interrupted 3-0 Vicryl sutures and the skin  was closed using staples. The wound was then cleaned and dried and dressed sterilely with gauze and Tegaderm.  Foley catheter was removed.  Bilateral scroti were checked and both testicles were  descended.  The patient was emerged from anesthesia and extubated and brought to the recovery room for further management.  The patient tolerated the procedure well and all counts were correct at the end of the case.   Howie IllJose Luis Curtis Schamp, MD

## 2016-06-09 NOTE — Anesthesia Preprocedure Evaluation (Signed)
Anesthesia Evaluation  Patient identified by MRN, date of birth, ID band Patient awake    Reviewed: Allergy & Precautions, H&P , NPO status , Patient's Chart, lab work & pertinent test results, reviewed documented beta blocker date and time   History of Anesthesia Complications (+) DIFFICULT AIRWAY and history of anesthetic complications  Airway Mallampati: III  TM Distance: >3 FB Neck ROM: full    Dental no notable dental hx. (+) Poor Dentition, Chipped   Pulmonary neg shortness of breath, sleep apnea and Continuous Positive Airway Pressure Ventilation , former smoker,    Pulmonary exam normal breath sounds clear to auscultation       Cardiovascular Exercise Tolerance: Good hypertension, Pt. on medications and Pt. on home beta blockers (-) angina(-) Past MI and (-) DOE Normal cardiovascular exam Rhythm:regular Rate:Normal     Neuro/Psych  Headaches, negative psych ROS   GI/Hepatic Neg liver ROS, GERD  Controlled,  Endo/Other  negative endocrine ROS  Renal/GU negative Renal ROS  negative genitourinary   Musculoskeletal negative musculoskeletal ROS (+)   Abdominal   Peds negative pediatric ROS (+)  Hematology negative hematology ROS (+)   Anesthesia Other Findings Past Medical History: No date: Abscess     Comment: Peri-rectal abscess No date: Chicken pox No date: ED (erectile dysfunction) No date: GERD (gastroesophageal reflux disease) No date: Hernia, inguinal, left No date: Hyperlipidemia No date: Hypertension No date: Migraine No date: Sleep apnea     Comment: BIPAP No date: Urinary retention  Past Surgical History: No date: ABSCESS DRAINAGE 1999: BACK SURGERY     Comment: L-5 No date: CHOLECYSTECTOMY 11/12/2015: INCISION AND DRAINAGE ABSCESS N/A     Comment: Procedure: INCISION AND DRAINAGE ABSCESS;                Surgeon: Leafy Roiego F Pabon, MD;  Location: [facility]               ORS;  Service: General;   Laterality: N/A; 2013: NECK FUSION No date: ROTATOR CUFF REPAIR Left No date: TONSILLECTOMY AND ADENOIDECTOMY  BMI    Body Mass Index:  37.02 kg/m      Reproductive/Obstetrics negative OB ROS                             Anesthesia Physical  Anesthesia Plan  ASA: III  Anesthesia Plan: General ETT   Post-op Pain Management:    Induction: Intravenous  Airway Management Planned: Oral ETT  Additional Equipment:   Intra-op Plan:   Post-operative Plan: Extubation in OR  Informed Consent: I have reviewed the patients History and Physical, chart, labs and discussed the procedure including the risks, benefits and alternatives for the proposed anesthesia with the patient or authorized representative who has indicated his/her understanding and acceptance.   Dental Advisory Given  Plan Discussed with: Anesthesiologist, CRNA and Surgeon  Anesthesia Plan Comments:         Anesthesia Quick Evaluation

## 2016-06-09 NOTE — Anesthesia Procedure Notes (Signed)
Procedure Name: Intubation Date/Time: 06/09/2016 3:19 PM Performed by: Stormy FabianURTIS, Rainn Zupko Pre-anesthesia Checklist: Patient identified, Patient being monitored, Timeout performed, Emergency Drugs available and Suction available Patient Re-evaluated:Patient Re-evaluated prior to inductionOxygen Delivery Method: Circle system utilized Preoxygenation: Pre-oxygenation with 100% oxygen Intubation Type: IV induction Ventilation: Mask ventilation without difficulty Laryngoscope Size: Mac and 4 Grade View: Grade II Tube type: Oral Tube size: 7.5 mm Number of attempts: 1 Airway Equipment and Method: Stylet Placement Confirmation: ETT inserted through vocal cords under direct vision,  positive ETCO2 and breath sounds checked- equal and bilateral Secured at: 22 (at the lip) cm Tube secured with: Tape Dental Injury: Teeth and Oropharynx as per pre-operative assessment

## 2016-06-09 NOTE — Transfer of Care (Signed)
Immediate Anesthesia Transfer of Care Note  Patient: Curtis Harper  Procedure(s) Performed: Procedure(s): HERNIA REPAIR INGUINAL ADULT (Left)  Patient Location: PACU  Anesthesia Type:General  Level of Consciousness: sedated  Airway & Oxygen Therapy: Patient Spontanous Breathing and Patient connected to face mask oxygen  Post-op Assessment: Report given to RN and Post -op Vital signs reviewed and stable  Post vital signs: Reviewed and stable  Last Vitals:  Vitals:   06/09/16 1334  BP: (!) 147/91  Pulse: 73  Resp: 18  Temp: 36.3 C    Last Pain:  Vitals:   06/09/16 1334  TempSrc: Oral         Complications: No apparent anesthesia complications

## 2016-06-09 NOTE — Interval H&P Note (Signed)
History and Physical Interval Note:  06/09/2016 2:41 PM  Curtis Harper  has presented today for surgery, with the diagnosis of recurrent left inguinal hernia  The various methods of treatment have been discussed with the patient and family. After consideration of risks, benefits and other options for treatment, the patient has consented to  Procedure(s): HERNIA REPAIR INGUINAL ADULT (Left) as a surgical intervention .  The patient's history has been reviewed, patient examined, no change in status, stable for surgery.  I have reviewed the patient's chart and labs.  Questions were answered to the patient's satisfaction.     Kenijah Benningfield

## 2016-06-09 NOTE — H&P (View-Only) (Signed)
05/25/2016  HPI: Patient underwent a diagnostic laparoscopy on 11/28. The initial surgery was supposed to be a laparoscopic left anal hernia repair. However after ports were placed and pneumoperitoneum was obtained and the patient was placed in Trendelenburg position, the patient had significant desaturation event. After relieving the pneumoperitoneum and placing him back in flat supine position, his oxygen saturation improved and normalized. However after a second attempt at surgery the same happened and does the surgery was aborted, the ports were removed and his incisions were closed.  Chest x-ray in the recovery room revealed clear lungs but with cardiomegaly. In light of this the patient was referred to both cardiology and pulmonology for evaluation for clearance for surgery. The patient was seen by Dr. End with cardiology and the patient is due for an echocardiogram on 12/11. He will see Dr. Simonds with pulmonology tomorrow as well.  Currently the patient reports some mild soreness over the incisions and that he has discomfort with sitting for prolonged periods of time due to the pressure over the incisions. Otherwise denies any nausea, vomiting, fevers, chills.  Vital signs: BP 137/87   Pulse 72   Temp 98.2 F (36.8 C) (Oral)   Ht 5' 11" (1.803 m)   Wt 116.2 kg (256 lb 3.2 oz)   BMI 35.73 kg/m    Physical Exam: Constitutional: No acute distress  Abdomen: Soft, obese, nondistended, with no tenderness to palpation over the incisions. All 3 incisions are clean dry and intact without any evidence of infection.  Assessment/Plan: 45-year-old male status post aborted laparoscopic left inguinal hernia repair proceeding only with a diagnostic laparoscopy due to option desaturation event 2.   -Had a long discussion with the patient regarding the events that happened during the surgery day as well as the reasoning behind having him being seen by cardiology and pulmonology. Patient understands  that this is being done for his safety in order to assess for any potential issues that could happen during surgery and the best way to prevent them. The patient does understand as well that a laparoscopic procedure will not be attempted again for his hernia repair and instead we would proceed with an open left inguinal hernia repair as long as he is cleared by cardiology and pulmonology. I did go over with the patient the recovery process and potential issues with an open procedure and he shows understanding of this.  -At this point will preemptively book the patient for an open left inguinal hernia repair on 12/19, pending clearance by both cardiology and pulmonology.    Curtis Biehler [name], MD [facility]  

## 2016-06-10 ENCOUNTER — Encounter: Payer: Self-pay | Admitting: Surgery

## 2016-06-10 NOTE — Anesthesia Postprocedure Evaluation (Signed)
Anesthesia Post Note  Patient: Curtis Harper  Procedure(s) Performed: Procedure(s) (LRB): HERNIA REPAIR INGUINAL ADULT (Left)  Patient location during evaluation: PACU Anesthesia Type: General Level of consciousness: awake and alert Pain management: pain level controlled Vital Signs Assessment: post-procedure vital signs reviewed and stable Respiratory status: spontaneous breathing, nonlabored ventilation, respiratory function stable and patient connected to nasal cannula oxygen Cardiovascular status: blood pressure returned to baseline and stable Postop Assessment: no signs of nausea or vomiting Anesthetic complications: no     Last Vitals:  Vitals:   06/09/16 1910 06/09/16 1933  BP: 120/67 117/77  Pulse: (!) 110 (!) 108  Resp: (!) 24 (!) 31  Temp:  36.6 C    Last Pain:  Vitals:   06/09/16 1933  TempSrc:   PainSc: 0-No pain                 Lenard SimmerAndrew Verlean Allport

## 2016-06-14 ENCOUNTER — Telehealth: Payer: Self-pay | Admitting: Surgery

## 2016-06-14 NOTE — Telephone Encounter (Signed)
Spoke with patient at this time. He stated he had some swelling in his scrotum. We discussed due to the Hernia repair that it is normal for him to have swelling. He also wanted to know if he needed to change the dressing. He was instructed to change the dressing daily. Patient was  reminded of his follow up appointment.  Patient is otherwise doing well. Denies fever, or any unusual pain.

## 2016-06-14 NOTE — Telephone Encounter (Signed)
Does he change dressing? Swelling in his genital area. Is this normal?

## 2016-06-17 ENCOUNTER — Ambulatory Visit (INDEPENDENT_AMBULATORY_CARE_PROVIDER_SITE_OTHER): Payer: Federal, State, Local not specified - PPO | Admitting: Surgery

## 2016-06-17 ENCOUNTER — Telehealth: Payer: Self-pay | Admitting: Surgery

## 2016-06-17 ENCOUNTER — Encounter: Payer: Self-pay | Admitting: Surgery

## 2016-06-17 ENCOUNTER — Telehealth: Payer: Self-pay | Admitting: Family Medicine

## 2016-06-17 VITALS — BP 133/82 | HR 88 | Temp 97.7°F | Ht 71.0 in | Wt 267.0 lb

## 2016-06-17 DIAGNOSIS — K4090 Unilateral inguinal hernia, without obstruction or gangrene, not specified as recurrent: Secondary | ICD-10-CM

## 2016-06-17 NOTE — Telephone Encounter (Signed)
Mild regurgitation. No need to worry. No need for further evaluation or monitoring.

## 2016-06-17 NOTE — Patient Instructions (Signed)
Please call our office with any questions or concerns.  Please do not submerge in a tub, hot tub, or pool until incisions are completely sealed.  Use sun block to incision area over the next year if this area will be exposed to sun. This helps decrease scarring.  If you develop redness, drainage, or pain at incision sites- call our office immediately and speak with a nurse.  

## 2016-06-17 NOTE — Progress Notes (Signed)
Outpatient postop visit  06/17/2016  Hanley Benodd L Boesen is an 45 y.o. male.    Procedure: Open repair of recurrent left inguinal hernia  CC: Minimal discomfort no pain  HPI: This patient status post open repair of a left inguinal hernia recurrence. He had an attempt at a laparoscopic approach but could not tolerate general anesthesia with the desaturation. Patient is feels well has minimal discomfort no pain no drainage. He had questions concerning why the operation took 3 hours which even after reviewing the operative report I cannot explain.  Medications reviewed.    Physical Exam:  There were no vitals taken for this visit.    PE: Wound is clean nonerythematous no drainage. Half the staples are removed Steri-Strips are placed with benzoin    Assessment/Plan:  Half staples are removed and replaced with Steri-Strips. Patient doing very well recommend follow up with Dr. Aleen CampiPiscoya at his next available clinic. She did not need a refill of pain medication  Lattie Hawichard E Nashali Ditmer, MD, FACS

## 2016-06-17 NOTE — Telephone Encounter (Signed)
Patient called and has a question regarding his disability paperwork

## 2016-06-17 NOTE — Telephone Encounter (Signed)
Pt called in regard to some questions he had about some short term disability paperwork, along with some questions regarding some test results. Please advise, thank you!  Call pt @ [phone]

## 2016-06-17 NOTE — Telephone Encounter (Signed)
Called patient back to answer his questions. He basically wanted to know if we had received his short-term disability. I told him that I received it not too long ago. 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 I told him that it sounds about right. I told patient that I would fill out his form today and fax it. He understood and had no further questions.

## 2016-06-17 NOTE — Telephone Encounter (Signed)
Pt was called and he stated he had not received benefits yet and that [employer] was faxing over a form for use to send over office visit from June to august in regards to Hernia. Pt stated that he had testing done before surgery on 06/09/16. 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 They stated that it was not anything to worry about right now but wanted to know moving forward what you'd like for him to do in regards to this discovery.

## 2016-06-17 NOTE — Telephone Encounter (Signed)
Pt called and was given Dr.Cook's comments in regards to mitrial valve degeneration. Pt was told we have not received information from [employer], but would check back on Tuesday.

## 2016-06-21 ENCOUNTER — Other Ambulatory Visit (INDEPENDENT_AMBULATORY_CARE_PROVIDER_SITE_OTHER): Payer: Federal, State, Local not specified - PPO

## 2016-06-21 DIAGNOSIS — R7303 Prediabetes: Secondary | ICD-10-CM | POA: Diagnosis not present

## 2016-06-21 DIAGNOSIS — E785 Hyperlipidemia, unspecified: Secondary | ICD-10-CM

## 2016-06-21 LAB — LIPID PANEL
CHOL/HDL RATIO: 4
Cholesterol: 203 mg/dL — ABNORMAL HIGH (ref 0–200)
HDL: 50.4 mg/dL (ref >39.00)
NONHDL: 152.63
TRIGLYCERIDES: 213 mg/dL — AB (ref 0.0–149.0)
VLDL: 42.6 mg/dL — AB (ref 0.0–40.0)

## 2016-06-21 LAB — HEMOGLOBIN A1C: Hgb A1c MFr Bld: 6.1 % (ref 4.6–6.5)

## 2016-06-21 LAB — LDL CHOLESTEROL, DIRECT: Direct LDL: 120 mg/dL

## 2016-06-22 ENCOUNTER — Telehealth: Payer: Self-pay | Admitting: Internal Medicine

## 2016-06-23 ENCOUNTER — Encounter: Payer: Self-pay | Admitting: Surgery

## 2016-06-23 ENCOUNTER — Ambulatory Visit (INDEPENDENT_AMBULATORY_CARE_PROVIDER_SITE_OTHER): Payer: Federal, State, Local not specified - PPO | Admitting: Surgery

## 2016-06-23 ENCOUNTER — Telehealth: Payer: Self-pay | Admitting: *Deleted

## 2016-06-23 VITALS — BP 124/82 | HR 76 | Temp 98.0°F | Wt 267.0 lb

## 2016-06-23 DIAGNOSIS — K4090 Unilateral inguinal hernia, without obstruction or gangrene, not specified as recurrent: Secondary | ICD-10-CM

## 2016-06-23 DIAGNOSIS — Z8719 Personal history of other diseases of the digestive system: Secondary | ICD-10-CM

## 2016-06-23 DIAGNOSIS — Z9889 Other specified postprocedural states: Secondary | ICD-10-CM

## 2016-06-23 DIAGNOSIS — K4091 Unilateral inguinal hernia, without obstruction or gangrene, recurrent: Secondary | ICD-10-CM

## 2016-06-23 NOTE — Progress Notes (Signed)
Formatting of this note might be different from the original.  06/23/2016    HPI:  Patient is s/p recurrent left inguinal hernia repair with mesh on 12/21 with the assistance of Dr. Tonita Cong.  He was seen in the office on 12/29 by Dr. Excell Seltzer, during which the patient reported some swelling but minimal discomfort and no drainage.  Half of the staples were removed and changed to steri strips.    Today, patient reports no issues and that the swelling has continued to decrease.  Denies any fevers, problems with the incision, new bulging, drainage, or other concerns.    Vital signs:  BP 124/82   Pulse 76   Temp 98 F (36.7 C) (Oral)   Wt 121.1 kg (267 lb)   BMI 37.24 kg/m      Physical Exam:  Constitutional:  No acute distress  Abdomen:  Soft, nondistended, nontender to palpation.  Left inguinal incision is clean, dry, and intact.  There is no recurrence.  Inflammation has continued to improve.    Assessment/Plan:  46 yo male s/p left inguinal hernia repair    --Discussed with patient that his surgery took longer due to the fact that it was a recurrent hernia repair, and that due to the significant scarring of his abdominal wall layers, a new fascial defect had been created which also required repair.  Discussed that one of my partners, Dr. Tonita Cong, came in to assist due to the difficulty of the repair.  However, the fascial defect was repaired, the inguinal hernia was repaired, and mesh was placed overlapping both repairs without significant tension.  --Remaining staples were removed and changed to steri strips.  --Patient may follow up on an as needed basis.  Have informed the patient to call if any fevers, chills, worsening pain, new bulging, drainage from the wound, redness/swellign of the incision, or other concerns.    Howie Ill, MD  [facility]    Electronically signed by Henrene Dodge, MD at 06/23/2016 11:26 AM EST

## 2016-06-23 NOTE — Telephone Encounter (Signed)
Patient stated [employer] faxed over paper work to be filled out in reference to a claim of a condition he was being treated for by Dr Adriana Simasook. Pt was checking the status of the form  Pt contact [phone]

## 2016-06-23 NOTE — Patient Instructions (Signed)
Please give us a call if you have any questions or concerns. 

## 2016-06-23 NOTE — Progress Notes (Signed)
06/23/2016  HPI: Patient is s/p recurrent left inguinal hernia repair with mesh on 12/21 with the assistance of Dr. Tonita CongWoodham.  He was seen in the office on 12/29 by Dr. Excell Seltzerooper, during which the patient reported some swelling but minimal discomfort and no drainage.  Half of the staples were removed and changed to steri strips.  Today, patient reports no issues and that the swelling has continued to decrease.  Denies any fevers, problems with the incision, new bulging, drainage, or other concerns.  Vital signs: BP 124/82   Pulse 76   Temp 98 F (36.7 C) (Oral)   Wt 121.1 kg (267 lb)   BMI 37.24 kg/m    Physical Exam: Constitutional:  No acute distress Abdomen:  Soft, nondistended, nontender to palpation.  Left inguinal incision is clean, dry, and intact.  There is no recurrence.  Inflammation has continued to improve.  Assessment/Plan: 46 yo male s/p left inguinal hernia repair  --Discussed with patient that his surgery took longer due to the fact that it was a recurrent hernia repair, and that due to the significant scarring of his abdominal wall layers, a new fascial defect had been created which also required repair.  Discussed that one of my partners, Dr. Tonita CongWoodham, came in to assist due to the difficulty of the repair.  However, the fascial defect was repaired, the inguinal hernia was repaired, and mesh was placed overlapping both repairs without significant tension. --Remaining staples were removed and changed to steri strips. --Patient may follow up on an as needed basis.  Have informed the patient to call if any fevers, chills, worsening pain, new bulging, drainage from the wound, redness/swellign of the incision, or other concerns.   Curtis IllJose Luis Dontray Haberland, MD [facility]

## 2016-06-24 ENCOUNTER — Encounter: Payer: Self-pay | Admitting: Surgery

## 2016-06-24 ENCOUNTER — Telehealth: Payer: Self-pay | Admitting: *Deleted

## 2016-06-24 NOTE — Telephone Encounter (Signed)
Pt was told results were not yet resulted by PCP.

## 2016-06-24 NOTE — Telephone Encounter (Signed)
Patient has requested lab results Pt contact [phone]

## 2016-06-24 NOTE — Telephone Encounter (Signed)
Pt was told taht we had not received paperwork. Pt was told by [employer] that they had sent to billing.

## 2016-06-24 NOTE — Telephone Encounter (Signed)
Left message on vm offering $50 voucher

## 2016-06-26 ENCOUNTER — Other Ambulatory Visit: Payer: Self-pay | Admitting: Family Medicine

## 2016-06-26 DIAGNOSIS — I10 Essential (primary) hypertension: Secondary | ICD-10-CM

## 2016-06-28 ENCOUNTER — Other Ambulatory Visit: Payer: 59

## 2016-06-29 ENCOUNTER — Ambulatory Visit (AMBULATORY_SURGERY_CENTER): Payer: Federal, State, Local not specified - PPO | Admitting: Internal Medicine

## 2016-06-29 ENCOUNTER — Encounter: Payer: Self-pay | Admitting: Internal Medicine

## 2016-06-29 ENCOUNTER — Telehealth: Payer: Self-pay | Admitting: Family Medicine

## 2016-06-29 ENCOUNTER — Encounter: Payer: Self-pay | Admitting: Surgery

## 2016-06-29 VITALS — BP 111/62 | HR 74 | Temp 99.1°F | Resp 14 | Ht 71.0 in | Wt 256.0 lb

## 2016-06-29 DIAGNOSIS — I10 Essential (primary) hypertension: Secondary | ICD-10-CM

## 2016-06-29 DIAGNOSIS — R634 Abnormal weight loss: Secondary | ICD-10-CM

## 2016-06-29 DIAGNOSIS — K625 Hemorrhage of anus and rectum: Secondary | ICD-10-CM | POA: Diagnosis not present

## 2016-06-29 DIAGNOSIS — R194 Change in bowel habit: Secondary | ICD-10-CM

## 2016-06-29 DIAGNOSIS — K219 Gastro-esophageal reflux disease without esophagitis: Secondary | ICD-10-CM | POA: Diagnosis not present

## 2016-06-29 DIAGNOSIS — R1013 Epigastric pain: Secondary | ICD-10-CM

## 2016-06-29 HISTORY — PX: COLONOSCOPY WITH ESOPHAGOGASTRODUODENOSCOPY (EGD): SHX5779

## 2016-06-29 MED ORDER — PANTOPRAZOLE SODIUM 40 MG PO TBEC: 40.0000 mg | DELAYED_RELEASE_TABLET | Freq: Two times a day (BID) | ORAL | 0 refills | Status: DC

## 2016-06-29 MED ORDER — SODIUM CHLORIDE 0.9 % IV SOLN: 500.0000 mL | INTRAVENOUS | Status: DC

## 2016-06-29 MED ORDER — VALSARTAN-HYDROCHLOROTHIAZIDE 320-25 MG PO TABS: 1.0000 | ORAL_TABLET | ORAL | 2 refills | Status: DC

## 2016-06-29 NOTE — Telephone Encounter (Signed)
Pt needs refills on the following for 90days sent to [facility] [address]  -pantoprazole (PROTONIX) 40 MG tablet -valsartan-hydrochlorothiazide (DIOVAN-HCT) 320-25 MG tablet

## 2016-06-29 NOTE — Progress Notes (Signed)
Spontaneous respirations throughout. VSS. Resting comfortably. To PACU on room air. Report to  Jane RN. 

## 2016-06-29 NOTE — Op Note (Signed)
[facility] Patient Name: Curtis Harper Procedure Date: 06/29/2016 2:41 PM MRN: [id number] Endoscopist: Wilhemina Bonito. Marina Goodell , MD Age: 46 Referring MD:  Date of Birth: December 06, 1970 Gender: Male Account #: [id number] Procedure:                Colonoscopy Indications:              Rectal bleeding, Change in bowel habits Medicines:                Monitored Anesthesia Care Procedure:                Pre-Anesthesia Assessment:                           - Prior to the procedure, a History and Physical                            was performed, and patient medications and                            allergies were reviewed. The patient's tolerance of                            previous anesthesia was also reviewed. The risks                            and benefits of the procedure and the sedation                            options and risks were discussed with the patient.                            All questions were answered, and informed consent                            was obtained. Prior Anticoagulants: The patient has                            taken no previous anticoagulant or antiplatelet                            agents. ASA Grade Assessment: II - A patient with                            mild systemic disease. After reviewing the risks                            and benefits, the patient was deemed in                            satisfactory condition to undergo the procedure.                           After obtaining informed consent, the colonoscope  was passed under direct vision. Throughout the                            procedure, the patient's blood pressure, pulse, and                            oxygen saturations were monitored continuously. The                            Model CF-HQ190L [id number]) scope was introduced                            through the anus and advanced to the the cecum,                            identified by  appendiceal orifice and ileocecal                            valve. The terminal ileum, ileocecal valve,                            appendiceal orifice, and rectum were photographed.                            The quality of the bowel preparation was excellent.                            The colonoscopy was performed without difficulty.                            The patient tolerated the procedure well. The bowel                            preparation used was SUPREP. Scope In: 2:55:59 PM Scope Out: 3:14:10 PM Scope Withdrawal Time: 0 hours 16 minutes 28 seconds  Total Procedure Duration: 0 hours 18 minutes 11 seconds  Findings:                 The terminal ileum appeared normal.                           A few small-mouthed diverticula were found in the                            sigmoid colon.                           Internal hemorrhoids were found during retroflexion.                           The exam was otherwise without abnormality on                            direct and retroflexion views. Complications:            No immediate  complications. Estimated blood loss:                            None. Estimated Blood Loss:     Estimated blood loss: none. Impression:               - The examined portion of the ileum was normal.                           - Diverticulosis in the sigmoid colon.                           - Internal hemorrhoids.                           - The examination was otherwise normal on direct                            and retroflexion views.                           - No specimens collected. Recommendation:           - Repeat colonoscopy in 10 years for screening                            purposes.                           - Recommend Metamucil 2 tablespoons daily in 12-14                            ounces of water or juice.                           - Resume previous diet.                           - Continue present medications. Wilhemina Bonito. Marina Goodell,  MD 06/29/2016 3:20:07 PM This report has been signed electronically.

## 2016-06-29 NOTE — Patient Instructions (Signed)
Impression/Recommendations:  Diverticulosis handout given to patient. Hemorrhoid handout given to patient.  Repeat colonoscopy in 10 years for screening purposes.  Recommend Metamucil 2 Tablespoons daily in 12-14 oz. Of water or juice.  Reflux precautions with attention to weight loss.  Resume previous diet. Continue present medications.  Return to primary provider care.   GI follow up as needed.  YOU HAD AN ENDOSCOPIC PROCEDURE TODAY AT THE [facility]:   Refer to the procedure report that was given to you for any specific questions about what was found during the examination.  If the procedure report does not answer your questions, please call your gastroenterologist to clarify.  If you requested that your care partner not be given the details of your procedure findings, then the procedure report has been included in a sealed envelope for you to review at your convenience later.  YOU SHOULD EXPECT: Some feelings of bloating in the abdomen. Passage of more gas than usual.  Walking can help get rid of the air that was put into your GI tract during the procedure and reduce the bloating. If you had a lower endoscopy (such as a colonoscopy or flexible sigmoidoscopy) you may notice spotting of blood in your stool or on the toilet paper. If you underwent a bowel prep for your procedure, you may not have a normal bowel movement for a few days.  Please Note:  You might notice some irritation and congestion in your nose or some drainage.  This is from the oxygen used during your procedure.  There is no need for concern and it should clear up in a day or so.  SYMPTOMS TO REPORT IMMEDIATELY:   Following lower endoscopy (colonoscopy or flexible sigmoidoscopy):  Excessive amounts of blood in the stool  Significant tenderness or worsening of abdominal pains  Swelling of the abdomen that is new, acute  Fever of 100F or higher   Following upper endoscopy (EGD)  Vomiting of blood or  coffee ground material  New chest pain or pain under the shoulder blades  Painful or persistently difficult swallowing  New shortness of breath  Fever of 100F or higher  Black, tarry-looking stools  For urgent or emergent issues, a gastroenterologist can be reached at any hour by calling [phone].   DIET:  We do recommend a small meal at first, but then you may proceed to your regular diet.  Drink plenty of fluids but you should avoid alcoholic beverages for 24 hours.  ACTIVITY:  You should plan to take it easy for the rest of today and you should NOT DRIVE or use heavy machinery until tomorrow (because of the sedation medicines used during the test).    FOLLOW UP: Our staff will call the number listed on your records the next business day following your procedure to check on you and address any questions or concerns that you may have regarding the information given to you following your procedure. If we do not reach you, we will leave a message.  However, if you are feeling well and you are not experiencing any problems, there is no need to return our call.  We will assume that you have returned to your regular daily activities without incident.  If any biopsies were taken you will be contacted by phone or by letter within the next 1-3 weeks.  Please call us at [phone] if you have not heard about the biopsies in 3 weeks.    SIGNATURES/CONFIDENTIALITY: You and/or your care partner  have signed paperwork which will be entered into your electronic medical record.  These signatures attest to the fact that that the information above on your After Visit Summary has been reviewed and is understood.  Full responsibility of the confidentiality of this discharge information lies with you and/or your care-partner.

## 2016-06-29 NOTE — Telephone Encounter (Signed)
Refill sent.

## 2016-06-29 NOTE — Op Note (Signed)
[facility] Patient Name: Curtis Harper Procedure Date: 06/29/2016 2:40 PM MRN: [id number] Endoscopist: Wilhemina Bonito. Marina Goodell , MD Age: 46 Referring MD:  Date of Birth: 16-Jan-1971 Gender: Male Account #: [id number] Procedure:                Upper GI endoscopy Indications:              Esophageal reflux symptoms that persist despite                            appropriate therapy Medicines:                Monitored Anesthesia Care Procedure:                Pre-Anesthesia Assessment:                           - Prior to the procedure, a History and Physical                            was performed, and patient medications and                            allergies were reviewed. The patient's tolerance of                            previous anesthesia was also reviewed. The risks                            and benefits of the procedure and the sedation                            options and risks were discussed with the patient.                            All questions were answered, and informed consent                            was obtained. Prior Anticoagulants: The patient has                            taken no previous anticoagulant or antiplatelet                            agents. ASA Grade Assessment: II - A patient with                            mild systemic disease. After reviewing the risks                            and benefits, the patient was deemed in                            satisfactory condition to undergo the procedure.  After obtaining informed consent, the endoscope was                            passed under direct vision. Throughout the                            procedure, the patient's blood pressure, pulse, and                            oxygen saturations were monitored continuously. The                            Model GIF-HQ190 [id number]) scope was introduced                            through the mouth, and advanced to the  second part                            of duodenum. The upper GI endoscopy was                            accomplished without difficulty. The patient                            tolerated the procedure well. Scope In: Scope Out: Findings:                 The esophagus was normal.                           The stomach was normal.                           The examined duodenum was normal.                           The cardia and gastric fundus were normal on                            retroflexion. Complications:            No immediate complications. Estimated Blood Loss:     Estimated blood loss: none. Impression:               - Normal EGD                           - GERD. Recommendation:           - Reflux precautions with attention to weight loss.                           - Resume previous diet.                           - Continue present medications.                           -  Return to the care of your primary provider. GI                            follow-up as needed Wilhemina BonitoJohn N. Marina GoodellPerry, MD 06/29/2016 3:26:58 PM This report has been signed electronically.

## 2016-06-30 ENCOUNTER — Telehealth: Payer: Self-pay

## 2016-06-30 NOTE — Telephone Encounter (Signed)
  Follow up Call-  Call back number 06/29/2016  Post procedure Call Back phone  # [phone]  Permission to leave phone message Yes  Some recent data might be hidden    Patient was called for follow up after his procedure on 06/29/2016. No answer at the number given for follow up phone call. A message was left on the answering machine.

## 2016-06-30 NOTE — Telephone Encounter (Signed)
  Follow up Call-  Call back number 06/29/2016  Post procedure Call Back phone  # [phone]  Permission to leave phone message Yes  Some recent data might be hidden     Patient questions:  Do you have a fever, pain , or abdominal swelling? No. Pain Score  0 *  Have you tolerated food without any problems? Yes.    Have you been able to return to your normal activities? Yes.    Do you have any questions about your discharge instructions: Diet   No. Medications  No. Follow up visit  No.  Do you have questions or concerns about your Care? No.  Actions: * If pain score is 4 or above: No action needed, pain <4.

## 2016-07-04 NOTE — Telephone Encounter (Signed)
Pt stated that his insurance declined this medication , he will need a prior authorization. Pt contact [phone]

## 2016-07-05 ENCOUNTER — Encounter: Payer: Self-pay | Admitting: Internal Medicine

## 2016-07-05 ENCOUNTER — Ambulatory Visit (INDEPENDENT_AMBULATORY_CARE_PROVIDER_SITE_OTHER): Payer: Federal, State, Local not specified - PPO | Admitting: Internal Medicine

## 2016-07-05 VITALS — BP 124/74 | HR 79 | Ht 71.0 in | Wt 272.5 lb

## 2016-07-05 DIAGNOSIS — I340 Nonrheumatic mitral (valve) insufficiency: Secondary | ICD-10-CM | POA: Diagnosis not present

## 2016-07-05 DIAGNOSIS — I10 Essential (primary) hypertension: Secondary | ICD-10-CM

## 2016-07-05 DIAGNOSIS — R0902 Hypoxemia: Secondary | ICD-10-CM

## 2016-07-05 NOTE — Patient Instructions (Signed)
Medication Instructions:  Your physician recommends that you continue on your current medications as directed. Please refer to the Current Medication list given to you today.   Labwork: - None ordered.   Testing/Procedures: - None ordered.   Follow-Up: Your physician wants you to follow-up in: 12 MONTHS WITH DR END.  You will receive a reminder letter in the mail two months in advance. If you don't receive a letter, please call our office to schedule the follow-up appointment.   If you need a refill on your cardiac medications before your next appointment, please call your pharmacy.

## 2016-07-05 NOTE — Telephone Encounter (Addendum)
A PA was needed for pt protonix. This PA was done over the phone he was approved from 05/06/16-07/05/17.

## 2016-07-05 NOTE — Telephone Encounter (Signed)
Per [facility] a voicemail was left for pt stating medication was approved and should be ready at pharmacy.

## 2016-07-05 NOTE — Progress Notes (Signed)
Follow-up Outpatient Visit Date: 07/05/2016  Primary Care Provider: Tommie SamsJayce G Cook, DO [address] [place] [id number]  Chief Complaint: Follow-up intraoperative hypoxia  HPI:  Mr. Curtis Harper is a 46 y.o. year-old male with history of hypertension, hyperlipidemia, GERD, sleep apnea, and obesity, who presents for follow-up of intraoperative hypoxia. This occurred during planned laparoscopic left inguinal hernia repair on 05/17/16. The procedure was aborted, as the patient had significant hypoxia with insufflation of the peritoneum. I saw him in clinic on 05/24/16, at which time the patient reported feeling well. We obtained a transthoracic echocardiogram with bubble study. This revealed normal biventricular function with mild mitral regurgitation. There is no evidence of intracardiac or pulmonary right to left shunt. The patient was also evaluated by Dr. Sung AmabileSimonds in the pulmonary clinic. The patient subsequently underwent open left inguinal hernia repair by Dr. Aleen CampiPiscoya on 06/09/16 without respiratory or anesthesia complications. Today, Mr. Curtis Harper reports feeling well without chest pain, palpitations, shortness of breath, and edema. He wears his CPAP most nights but occasionally will forget to use it. He inquired today about obtaining supplies for his device. He is due to return back to work tomorrow.  --------------------------------------------------------------------------------------------------  Cardiovascular History & Procedures: Cardiovascular Problems:  Intraoperative hypoxia  Risk Factors:  Hypertension, hyperlipidemia, obesity, and male gender  Cath/PCI:  None  CV Surgery:  None  EP Procedures and Devices:  None  Non-Invasive Evaluation(s):  TTE with bubble study (05/30/16): Normal biventricular size and function (LVEF 60-65%). Mild mitral regurgitation and mild biatrial enlargement. Negative bubble study without evidence of atrial or pulmonary right to  left shunt.  Recent CV Pertinent Labs: Lab Results  Component Value Date   CHOL 203 (H) 06/21/2016   HDL 50.40 06/21/2016   LDLCALC 117 (H) 03/23/2016   LDLDIRECT 120.0 06/21/2016   TRIG 213.0 (H) 06/21/2016   CHOLHDL 4 06/21/2016   K 3.5 05/16/2016   BUN 12 03/23/2016   BUN 11 01/18/2016   CREATININE 0.78 03/23/2016    Past medical and surgical history were reviewed and updated in EPIC.  Outpatient Encounter Prescriptions as of 07/05/2016  Medication Sig  . ibuprofen (ADVIL,MOTRIN) 600 MG tablet Take 1 tablet (600 mg total) by mouth every 8 (eight) hours as needed for fever or mild pain.  . metoprolol succinate (TOPROL-XL) 25 MG 24 hr tablet TAKE 1 TABLET BY MOUTH EVERY DAY  . pantoprazole (PROTONIX) 40 MG tablet Take 1 tablet (40 mg total) by mouth 2 (two) times daily before a meal.  . sildenafil (VIAGRA) 100 MG tablet Take 0.5-1 tablets (50-100 mg total) by mouth daily as needed for erectile dysfunction.  . valsartan-hydrochlorothiazide (DIOVAN-HCT) 320-25 MG tablet Take 1 tablet by mouth 1 day or 1 dose.   Facility-Administered Encounter Medications as of 07/05/2016  Medication  . 0.9 %  sodium chloride infusion    Allergies: Lisinopril  Social History   Social History  . Marital status: Divorced    Spouse name: N/A  . Number of children: 4  . Years of education: N/A   Occupational History  . truck driver    Social History Main Topics  . Smoking status: Former Smoker    Packs/day: 0.50    Years: 10.00    Types: Cigarettes    Quit date: 05/11/1996  . Smokeless tobacco: Never Used  . Alcohol use 1.8 oz/week    1 Glasses of wine, 2 Cans of beer per week     Comment: occ  . Drug use: No  .  Sexual activity: Yes    Partners: Female   Other Topics Concern  . Not on file   Social History Narrative  . No narrative on file    Family History  Problem Relation Age of Onset  . Stomach cancer Father     spread to lungs  . Hypertension Mother   .  Hyperlipidemia Mother   . Prostate cancer Maternal Grandfather   . Colon cancer Neg Hx   . Colon polyps Neg Hx   . Esophageal cancer Neg Hx   . Rectal cancer Neg Hx     Review of Systems: Review of Systems  Constitutional: Negative.   Respiratory: Negative.   Cardiovascular: Negative.   Gastrointestinal: Negative.   Neurological: Negative.    --------------------------------------------------------------------------------------------------  Physical Exam: BP 124/74 (BP Location: Left Arm, Patient Position: Sitting, Cuff Size: Normal)   Pulse 79   Ht 5\' 11"  (1.803 m)   Wt 272 lb 8 oz (123.6 kg)   BMI 38.01 kg/m   General:  Obese man, seated comfortably in the exam room. HEENT: No conjunctival pallor or scleral icterus.  Moist mucous membranes.  OP clear. Lungs: Normal work of breathing.  Clear to auscultation bilaterally without wheezes or crackles. Heart: Regular rate and rhythm without murmurs, rubs, or gallops.  Non-displaced PMI. Abd: Bowel sounds present.  Soft, NT/ND without hepatosplenomegaly Ext: No lower extremity edema.  Radial, PT, and DP pulses are 2+ bilaterally.  Lab Results  Component Value Date   WBC 7.7 03/23/2016   HGB 13.6 03/23/2016   HCT 40.0 03/23/2016   MCV 84.8 03/23/2016   PLT 333.0 03/23/2016    Lab Results  Component Value Date   NA 138 03/23/2016   K 3.5 05/16/2016   CL 99 03/23/2016   CO2 31 03/23/2016   BUN 12 03/23/2016   CREATININE 0.78 03/23/2016   GLUCOSE 85 03/23/2016   ALT 17 03/23/2016    Lab Results  Component Value Date   CHOL 203 (H) 06/21/2016   HDL 50.40 06/21/2016   LDLCALC 117 (H) 03/23/2016   LDLDIRECT 120.0 06/21/2016   TRIG 213.0 (H) 06/21/2016   CHOLHDL 4 06/21/2016   --------------------------------------------------------------------------------------------------  ASSESSMENT AND PLAN: Intraoperative hypoxia No obvious cardiac pathology to explain this event during initial inguinal hernia repair  attempt. Patient reportedly tolerated open inguinal hernia repair without any hypoxia or other anesthesia complications. Echocardiogram revealed mild MR and mild biatrial enlargement but otherwise no significant abnormalities. No further cardiac workup at this time.  Mild mitral regurgitation Patient is asymptomatic. We will continue clinical follow-up with return visit in a year.  Essential hypertension Blood pressure well controlled today. No changes to his current regimen.  Follow-up: Return to clinic in one year.  Yvonne Kendall, MD 07/05/2016 8:07 AM

## 2016-07-12 ENCOUNTER — Telehealth: Payer: Self-pay | Admitting: Pulmonary Disease

## 2016-07-12 NOTE — Telephone Encounter (Signed)
Pt states he received a letter stating he needs to schedule an appt, state he has seen Dr. Sung AmabileSimonds already in December, this was for clearance, which he has already had, he sees Dr. Okey DupreEnd in 1 year.

## 2016-07-12 NOTE — Telephone Encounter (Signed)
Informed pt to disregard letter since he was just seen in December and is scheduled for his 3 month f/u. Nothing further needed.

## 2016-08-19 ENCOUNTER — Ambulatory Visit (INDEPENDENT_AMBULATORY_CARE_PROVIDER_SITE_OTHER): Payer: Federal, State, Local not specified - PPO | Admitting: Pulmonary Disease

## 2016-08-19 ENCOUNTER — Encounter: Payer: Self-pay | Admitting: Pulmonary Disease

## 2016-08-19 VITALS — BP 124/82 | HR 83 | Wt 272.0 lb

## 2016-08-19 DIAGNOSIS — E669 Obesity, unspecified: Secondary | ICD-10-CM

## 2016-08-19 DIAGNOSIS — G4733 Obstructive sleep apnea (adult) (pediatric): Secondary | ICD-10-CM | POA: Diagnosis not present

## 2016-08-19 NOTE — Patient Instructions (Signed)
1) we will repeat the split-night sleep study to confirm your prior diagnosis of obstructive sleep apnea and to discern the proper settings on BiPAP  2) we discussed weight loss strategies including dietary changes that can be made  3) after the sleep study results are available to us, my office will contact you to make whatever changes on BiPAP that are indicated. After that, follow-up will be arranged

## 2016-08-19 NOTE — Progress Notes (Signed)
SLEEP CONSULT NOTE  Requesting MD/Service: Self Date of initial consultation: 08/18/16 Reason for consultation: OSA  PT PROFILE: 46 y.o. M with OSA first diagnosed 2007 with poor compliance with CPAP due to difficulty tolerating it  HPI:  Curtis Harper is a 46 year old gentleman known to me from a previous evaluation for transient intraoperative hypoxemia. He returns today for evaluation and management of sleep apnea. He indicates to me that he was diagnosed with sleep apnea in 2007 and [place]. He was prescribed BiPAP and he does not know the precise settings. He has difficulty tolerating the BiPAP. He uses nasal pillows when he does wear it. He estimates that he uses it 4 nights per week at a maximum of 4 hours per night. He has excessive daytime sleepiness and it is also notable that he is treated for hypertension and erectile dysfunction. He does complain of chronic nasal congestion for which he uses Afrin nasal spray most nights.  Past Medical History:  Diagnosis Date  . Abscess    Peri-rectal abscess  . Chicken pox   . Complication of anesthesia    oxygen sats dropped  . Difficult intubation   . ED (erectile dysfunction)   . GERD (gastroesophageal reflux disease)   . Hernia, inguinal, left   . Hyperlipidemia   . Hypertension   . Migraine   . Sleep apnea    BIPAP  . Urinary retention     Past Surgical History:  Procedure Laterality Date  . ABSCESS DRAINAGE    . BACK SURGERY  1999   L-5  . CHOLECYSTECTOMY    . INCISION AND DRAINAGE ABSCESS N/A 11/12/2015   Procedure: INCISION AND DRAINAGE ABSCESS;  Surgeon: Leafy Ro, MD;  Location: [facility] ORS;  Service: General;  Laterality: N/A;  . INGUINAL HERNIA REPAIR Left 06/09/2016   Procedure: HERNIA REPAIR INGUINAL ADULT;  Surgeon: Henrene Dodge, MD;  Location: [facility] ORS;  Service: General;  Laterality: Left;  . LAPAROSCOPY  05/17/2016   Procedure: LAPAROSCOPY DIAGNOSTIC;  Surgeon: Henrene Dodge, MD;  Location: [facility] ORS;   Service: General;;  . NECK FUSION  2013  . ROTATOR CUFF REPAIR Left   . TONSILLECTOMY AND ADENOIDECTOMY      MEDICATIONS: I have reviewed all medications and confirmed regimen as documented  Social History   Social History  . Marital status: Divorced    Spouse name: N/A  . Number of children: 4  . Years of education: N/A   Occupational History  . truck driver    Social History Main Topics  . Smoking status: Former Smoker    Packs/day: 0.50    Years: 10.00    Types: Cigarettes    Quit date: 05/11/1996  . Smokeless tobacco: Never Used  . Alcohol use 1.8 oz/week    1 Glasses of wine, 2 Cans of beer per week     Comment: occ  . Drug use: No  . Sexual activity: Yes    Partners: Female   Other Topics Concern  . Not on file   Social History Narrative  . No narrative on file    Family History  Problem Relation Age of Onset  . Stomach cancer Father     spread to lungs  . Hypertension Mother   . Hyperlipidemia Mother   . Prostate cancer Maternal Grandfather   . Colon cancer Neg Hx   . Colon polyps Neg Hx   . Esophageal cancer Neg Hx   . Rectal cancer Neg Hx     ROS:  No fever, myalgias/arthralgias, unexplained weight loss or weight gain No new focal weakness or sensory deficits No otalgia, hearing loss, visual changes, nasal and sinus symptoms, mouth and throat problems No neck pain or adenopathy No abdominal pain, N/V/D, diarrhea, change in bowel pattern No dysuria, change in urinary pattern   Vitals:   08/19/16 0918  BP: 124/82  Pulse: 83  SpO2: 96%  Weight: 272 lb (123.4 kg)     EXAM:  Gen: Mildly-moderately obese, relatively muscular, No overt respiratory distress HEENT: NCAT, sclera white, oropharynx normal, mild-mod rhinitis Neck: Supple without LAN, thyromegaly, JVD Lungs: breath sounds: full, percussion: norma;, No adventitious sounds Cardiovascular: RRR, no murmurs noted Abdomen: Soft, nontender, normal BS Ext: without clubbing, cyanosis,  edema Neuro: CNs grossly intact, motor and sensory intact Skin: Limited exam, no lesions noted  DATA:   BMP Latest Ref Rng & Units 05/16/2016 03/23/2016 01/18/2016  Glucose 70 - 99 mg/dL - 85 91  BUN 6 - 23 mg/dL - 12 11  Creatinine 1.610.40 - 1.50 mg/dL - 0.960.78 0.450.94  BUN/Creat Ratio 9 - 20 - - 12  Sodium 135 - 145 mEq/L - 138 140  Potassium 3.5 - 5.1 mmol/L 3.5 3.5 3.8  Chloride 96 - 112 mEq/L - 99 98  CO2 19 - 32 mEq/L - 31 24  Calcium 8.4 - 10.5 mg/dL - 9.6 9.4    CBC Latest Ref Rng & Units 03/23/2016 11/15/2015 11/14/2015  WBC 4.0 - 10.5 K/uL 7.7 8.5 10.9(H)  Hemoglobin 13.0 - 17.0 g/dL 40.913.6 10.5(L) 11.4(L)  Hematocrit 39.0 - 52.0 % 40.0 30.5(L) 33.5(L)  Platelets 150.0 - 400.0 K/uL 333.0 285 269    CXR:  No new film  IMPRESSION:     ICD-9-CM ICD-10-CM   1. OSA-suspect severe with complications of erectile dysfunction and hypertension 327.23 G47.33 Split night study  2. Obesity 278.00 E66.9    3. Chronic rhinitis  PLAN:  1) split-night sleep study has been ordered. I have designated that he use his own nasal pillows. I have designated that they titrate to BiPAP if necessary. 2) we discussed weight loss strategies including dietary changes that can be made 3) follow-up will be arranged after the sleep study results are available to us and after we have made recommendations regarding new BiPAP settings 4) I have recommended that he buy over-the-counter Flonase or other nasal steroid to be used in place of Afrin   Curtis Fischeravid Crysta Gulick, MD [facility] service Mobile [phone] Pager [phone] 08/19/2016

## 2016-09-14 ENCOUNTER — Ambulatory Visit: Payer: Federal, State, Local not specified - PPO | Attending: Internal Medicine

## 2016-09-14 DIAGNOSIS — G4761 Periodic limb movement disorder: Secondary | ICD-10-CM | POA: Insufficient documentation

## 2016-09-14 DIAGNOSIS — G4733 Obstructive sleep apnea (adult) (pediatric): Secondary | ICD-10-CM | POA: Diagnosis not present

## 2016-09-14 DIAGNOSIS — I340 Nonrheumatic mitral (valve) insufficiency: Secondary | ICD-10-CM | POA: Diagnosis not present

## 2016-09-14 DIAGNOSIS — I10 Essential (primary) hypertension: Secondary | ICD-10-CM | POA: Diagnosis not present

## 2016-09-15 ENCOUNTER — Inpatient Hospital Stay (HOSPITAL_COMMUNITY)
Admission: EM | Admit: 2016-09-15 | Discharge: 2016-09-18 | DRG: 395 | Disposition: A | Discharge status: Home / Self Care | Payer: Federal, State, Local not specified - PPO | Attending: General Surgery | Admitting: General Surgery

## 2016-09-15 ENCOUNTER — Emergency Department (HOSPITAL_COMMUNITY): Payer: Federal, State, Local not specified - PPO

## 2016-09-15 ENCOUNTER — Encounter (HOSPITAL_COMMUNITY): Payer: Self-pay

## 2016-09-15 DIAGNOSIS — R109 Unspecified abdominal pain: Secondary | ICD-10-CM | POA: Diagnosis not present

## 2016-09-15 DIAGNOSIS — Z888 Allergy status to other drugs, medicaments and biological substances status: Secondary | ICD-10-CM

## 2016-09-15 DIAGNOSIS — K648 Other hemorrhoids: Secondary | ICD-10-CM | POA: Diagnosis not present

## 2016-09-15 DIAGNOSIS — I10 Essential (primary) hypertension: Secondary | ICD-10-CM | POA: Diagnosis not present

## 2016-09-15 DIAGNOSIS — K625 Hemorrhage of anus and rectum: Secondary | ICD-10-CM | POA: Diagnosis not present

## 2016-09-15 DIAGNOSIS — Z87891 Personal history of nicotine dependence: Secondary | ICD-10-CM

## 2016-09-15 DIAGNOSIS — K611 Rectal abscess: Principal | ICD-10-CM | POA: Diagnosis present

## 2016-09-15 DIAGNOSIS — E876 Hypokalemia: Secondary | ICD-10-CM

## 2016-09-15 DIAGNOSIS — G4730 Sleep apnea, unspecified: Secondary | ICD-10-CM | POA: Diagnosis not present

## 2016-09-15 DIAGNOSIS — K219 Gastro-esophageal reflux disease without esophagitis: Secondary | ICD-10-CM | POA: Diagnosis present

## 2016-09-15 DIAGNOSIS — K610 Anal abscess: Secondary | ICD-10-CM | POA: Diagnosis not present

## 2016-09-15 DIAGNOSIS — N410 Acute prostatitis: Secondary | ICD-10-CM | POA: Diagnosis not present

## 2016-09-15 LAB — CBC WITH DIFFERENTIAL/PLATELET
Basophils Absolute: 0 10*3/uL (ref 0.0–0.1)
Basophils Relative: 0 %
Eosinophils Absolute: 0.2 10*3/uL (ref 0.0–0.7)
Eosinophils Relative: 2 %
HCT: 39.4 % (ref 39.0–52.0)
Hemoglobin: 13.3 g/dL (ref 13.0–17.0)
LYMPHS ABS: 2 10*3/uL (ref 0.7–4.0)
Lymphocytes Relative: 15 %
MCH: 29.2 pg (ref 26.0–34.0)
MCHC: 33.8 g/dL (ref 30.0–36.0)
MCV: 86.4 fL (ref 78.0–100.0)
MONOS PCT: 7 %
Monocytes Absolute: 0.9 10*3/uL (ref 0.1–1.0)
Neutro Abs: 9.8 10*3/uL — ABNORMAL HIGH (ref 1.7–7.7)
Neutrophils Relative %: 76 %
PLATELETS: 275 10*3/uL (ref 150–400)
RBC: 4.56 MIL/uL (ref 4.22–5.81)
RDW: 12.6 % (ref 11.5–15.5)
WBC: 12.9 10*3/uL — AB (ref 4.0–10.5)

## 2016-09-15 LAB — URINALYSIS, ROUTINE W REFLEX MICROSCOPIC
Bilirubin Urine: NEGATIVE
Glucose, UA: NEGATIVE mg/dL
Ketones, ur: NEGATIVE mg/dL
Leukocytes, UA: NEGATIVE
Nitrite: NEGATIVE
PH: 6 (ref 5.0–8.0)
Protein, ur: NEGATIVE mg/dL
SPECIFIC GRAVITY, URINE: 1.008 (ref 1.005–1.030)
Squamous Epithelial / HPF: NONE SEEN

## 2016-09-15 LAB — BASIC METABOLIC PANEL
Anion gap: 13 (ref 5–15)
BUN: 9 mg/dL (ref 6–20)
CHLORIDE: 97 mmol/L — AB (ref 101–111)
CO2: 27 mmol/L (ref 22–32)
CREATININE: 0.84 mg/dL (ref 0.61–1.24)
Calcium: 9.4 mg/dL (ref 8.9–10.3)
GFR calc Af Amer: >60 mL/min (ref >60)
GFR calc non Af Amer: >60 mL/min (ref >60)
Glucose, Bld: 119 mg/dL — ABNORMAL HIGH (ref 65–99)
POTASSIUM: 3.1 mmol/L — AB (ref 3.5–5.1)
SODIUM: 137 mmol/L (ref 135–145)

## 2016-09-15 LAB — I-STAT CG4 LACTIC ACID, ED: LACTIC ACID, VENOUS: 1.25 mmol/L (ref 0.5–1.9)

## 2016-09-15 MED ORDER — SODIUM CHLORIDE 0.9 % IV BOLUS (SEPSIS)
1000.0000 mL | Freq: Once | INTRAVENOUS | Status: AC
  Administered 2016-09-15: 1000 mL via INTRAVENOUS

## 2016-09-15 MED ORDER — METOPROLOL SUCCINATE ER 25 MG PO TB24
25.0000 mg | ORAL_TABLET | Freq: Every day | ORAL | Status: DC
  Administered 2016-09-17 – 2016-09-18 (×2): 25 mg via ORAL
  Filled 2016-09-15 (×3): qty 1

## 2016-09-15 MED ORDER — ONDANSETRON HCL 4 MG/2ML IJ SOLN
4.0000 mg | Freq: Once | INTRAMUSCULAR | Status: AC
  Administered 2016-09-15: 4 mg via INTRAVENOUS
  Filled 2016-09-15: qty 2

## 2016-09-15 MED ORDER — PIPERACILLIN-TAZOBACTAM 3.375 G IVPB
3.3750 g | Freq: Three times a day (TID) | INTRAVENOUS | Status: DC
  Administered 2016-09-15 – 2016-09-18 (×8): 3.375 g via INTRAVENOUS
  Filled 2016-09-15 (×10): qty 50

## 2016-09-15 MED ORDER — FENTANYL CITRATE (PF) 100 MCG/2ML IJ SOLN
50.0000 ug | Freq: Once | INTRAMUSCULAR | Status: AC
  Administered 2016-09-15: 50 ug via INTRAVENOUS
  Filled 2016-09-15: qty 2

## 2016-09-15 MED ORDER — POTASSIUM CHLORIDE 2 MEQ/ML IV SOLN
30.0000 meq | INTRAVENOUS | Status: AC
  Administered 2016-09-15: 30 meq via INTRAVENOUS
  Filled 2016-09-15: qty 15

## 2016-09-15 MED ORDER — MORPHINE SULFATE (PF) 4 MG/ML IV SOLN
4.0000 mg | Freq: Once | INTRAVENOUS | Status: AC
  Administered 2016-09-15: 4 mg via INTRAVENOUS
  Filled 2016-09-15: qty 1

## 2016-09-15 MED ORDER — ACETAMINOPHEN 650 MG RE SUPP
650.0000 mg | Freq: Four times a day (QID) | RECTAL | Status: DC | PRN
Start: 2016-09-15 — End: 2016-09-18

## 2016-09-15 MED ORDER — ONDANSETRON HCL 4 MG/2ML IJ SOLN: 4.0000 mg | Freq: Four times a day (QID) | INTRAMUSCULAR | Status: DC | PRN

## 2016-09-15 MED ORDER — OXYCODONE HCL 5 MG PO TABS
5.0000 mg | ORAL_TABLET | ORAL | Status: DC | PRN
  Administered 2016-09-15 – 2016-09-17 (×5): 10 mg via ORAL
  Filled 2016-09-15 (×5): qty 2

## 2016-09-15 MED ORDER — SODIUM CHLORIDE 0.9 % IV BOLUS (SEPSIS)
1000.0000 mL | Freq: Once | INTRAVENOUS | Status: DC
Start: 2016-09-15 — End: 2016-09-15

## 2016-09-15 MED ORDER — HYDRALAZINE HCL 20 MG/ML IJ SOLN: 10.0000 mg | INTRAMUSCULAR | Status: DC | PRN

## 2016-09-15 MED ORDER — ONDANSETRON 4 MG PO TBDP: 4.0000 mg | ORAL_TABLET | Freq: Four times a day (QID) | ORAL | Status: DC | PRN

## 2016-09-15 MED ORDER — MORPHINE SULFATE (PF) 2 MG/ML IV SOLN
2.0000 mg | INTRAVENOUS | Status: DC | PRN
  Administered 2016-09-16: 2 mg via INTRAVENOUS
  Filled 2016-09-15: qty 1

## 2016-09-15 MED ORDER — IOPAMIDOL (ISOVUE-300) INJECTION 61%
INTRAVENOUS | Status: AC
  Administered 2016-09-15: 100 mL
  Filled 2016-09-15: qty 100

## 2016-09-15 MED ORDER — PIPERACILLIN-TAZOBACTAM 3.375 G IVPB 30 MIN
3.3750 g | Freq: Once | INTRAVENOUS | Status: AC
  Administered 2016-09-15: 3.375 g via INTRAVENOUS
  Filled 2016-09-15: qty 50

## 2016-09-15 MED ORDER — KCL IN DEXTROSE-NACL 20-5-0.45 MEQ/L-%-% IV SOLN
INTRAVENOUS | Status: DC
  Administered 2016-09-15 – 2016-09-16 (×2): via INTRAVENOUS
  Filled 2016-09-15 (×2): qty 1000

## 2016-09-15 MED ORDER — ACETAMINOPHEN 325 MG PO TABS
650.0000 mg | ORAL_TABLET | Freq: Four times a day (QID) | ORAL | Status: DC | PRN
  Administered 2016-09-17 – 2016-09-18 (×2): 650 mg via ORAL
  Filled 2016-09-15 (×2): qty 2

## 2016-09-15 MED ORDER — POTASSIUM CHLORIDE CRYS ER 20 MEQ PO TBCR
40.0000 meq | EXTENDED_RELEASE_TABLET | Freq: Once | ORAL | Status: AC
  Administered 2016-09-15: 40 meq via ORAL
  Filled 2016-09-15: qty 2

## 2016-09-15 MED ORDER — DIPHENHYDRAMINE HCL 50 MG/ML IJ SOLN: 25.0000 mg | Freq: Four times a day (QID) | INTRAMUSCULAR | Status: DC | PRN

## 2016-09-15 MED ORDER — DIPHENHYDRAMINE HCL 25 MG PO CAPS
25.0000 mg | ORAL_CAPSULE | Freq: Four times a day (QID) | ORAL | Status: DC | PRN
Start: 2016-09-15 — End: 2016-09-18

## 2016-09-15 MED ORDER — PANTOPRAZOLE SODIUM 40 MG PO TBEC
40.0000 mg | DELAYED_RELEASE_TABLET | Freq: Two times a day (BID) | ORAL | Status: DC
  Administered 2016-09-16 – 2016-09-18 (×4): 40 mg via ORAL
  Filled 2016-09-15 (×4): qty 1

## 2016-09-15 NOTE — ED Notes (Signed)
Patient transported to CT 

## 2016-09-15 NOTE — ED Provider Notes (Signed)
[facility]Y DEPT Provider Note   CSN: [id number] Arrival date & time: 09/15/16  4098  By signing my name below, I, Teofilo Pod, attest that this documentation has been prepared under the direction and in the presence of Azucena Kuba, PA-C. Electronically Signed: Teofilo Pod, ED Scribe. 09/15/2016. 2:24 PM.    History   Chief Complaint Chief Complaint  Patient presents with  . Abscess    The history is provided by the patient. No language interpreter was used.   HPI Comments: Curtis Harper is a 46 y.o. male who presents to the Emergency Department complaining of a moderate, gradually worsening area of pain and swelling to the left buttocks that began last night. Pt complains of associated difficulty urinating, rectal pain, diarrhea. Pt states pain is exacerbated with palpation and direct pressure. Pt denies any drainage from the area. He reports hx of abscesses in the same area and has had to have 3 I&Ds. No alleviating factors noted. Pt denies fever, blood in stool, abd pain, urinary symptoms.    Past Medical History:  Diagnosis Date  . Abscess    Peri-rectal abscess  . Chicken pox   . Complication of anesthesia    oxygen sats dropped  . Difficult intubation   . ED (erectile dysfunction)   . GERD (gastroesophageal reflux disease)   . Hernia, inguinal, left   . Hyperlipidemia   . Hypertension   . Migraine   . Sleep apnea    BIPAP  . Urinary retention     Patient Active Problem List   Diagnosis Date Noted  . Non-rheumatic mitral regurgitation 07/05/2016  . S/P left inguinal hernia repair 06/23/2016  . Abdominal pain 03/24/2016  . Constipation 03/24/2016  . Rectal bleeding 03/24/2016  . Essential hypertension 03/24/2016  . Erectile dysfunction 03/24/2016  . Perirectal abscess 11/12/2015    Past Surgical History:  Procedure Laterality Date  . ABSCESS DRAINAGE    . BACK SURGERY  1999   L-5  . CHOLECYSTECTOMY    . HERNIA REPAIR  06/09/2016    . INCISION AND DRAINAGE ABSCESS N/A 11/12/2015   Procedure: INCISION AND DRAINAGE ABSCESS;  Surgeon: Leafy Ro, MD;  Location: [facility] ORS;  Service: General;  Laterality: N/A;  . INGUINAL HERNIA REPAIR Left 06/09/2016   Procedure: HERNIA REPAIR INGUINAL ADULT;  Surgeon: Henrene Dodge, MD;  Location: [facility] ORS;  Service: General;  Laterality: Left;  . LAPAROSCOPY  05/17/2016   Procedure: LAPAROSCOPY DIAGNOSTIC;  Surgeon: Henrene Dodge, MD;  Location: [facility] ORS;  Service: General;;  . NECK FUSION  2013  . ROTATOR CUFF REPAIR Left   . TONSILLECTOMY AND ADENOIDECTOMY         Home Medications    Prior to Admission medications   Medication Sig Start Date End Date Taking? Authorizing Provider  ibuprofen (ADVIL,MOTRIN) 600 MG tablet Take 1 tablet (600 mg total) by mouth every 8 (eight) hours as needed for fever or mild pain. 06/09/16   Henrene Dodge, MD  metoprolol succinate (TOPROL-XL) 25 MG 24 hr tablet TAKE 1 TABLET BY MOUTH EVERY DAY 06/27/16   Tommie Sams, DO  pantoprazole (PROTONIX) 40 MG tablet Take 1 tablet (40 mg total) by mouth 2 (two) times daily before a meal. 06/29/16   Tommie Sams, DO  sildenafil (VIAGRA) 100 MG tablet Take 0.5-1 tablets (50-100 mg total) by mouth daily as needed for erectile dysfunction. 03/24/16   Jayce G Cook, DO  valsartan-hydrochlorothiazide (DIOVAN-HCT) 320-25 MG tablet Take 1 tablet by  mouth 1 day or 1 dose. 06/29/16   Tommie Sams, DO    Family History Family History  Problem Relation Age of Onset  . Stomach cancer Father     spread to lungs  . Hypertension Mother   . Hyperlipidemia Mother   . Prostate cancer Maternal Grandfather   . Colon cancer Neg Hx   . Colon polyps Neg Hx   . Esophageal cancer Neg Hx   . Rectal cancer Neg Hx     Social History Social History  Substance Use Topics  . Smoking status: Former Smoker    Packs/day: 0.50    Years: 10.00    Types: Cigarettes    Quit date: 05/11/1996  . Smokeless tobacco: Never Used  . Alcohol use  1.8 oz/week    1 Glasses of wine, 2 Cans of beer per week     Comment: occ     Allergies   Lisinopril   Review of Systems Review of Systems  Constitutional: Negative for fever.  Gastrointestinal: Positive for diarrhea. Negative for blood in stool.  Genitourinary: Positive for difficulty urinating.  All other systems reviewed and are negative.    Physical Exam Updated Vital Signs BP 111/75 (BP Location: Left Arm)   Pulse (!) 102   Temp 99.3 F (37.4 C) (Oral)   Resp 15   Ht 5\' 11"  (1.803 m)   Wt 270 lb (122.5 kg)   SpO2 96%   BMI 37.66 kg/m   Physical Exam  Constitutional: He appears well-developed and well-nourished. No distress.  Non toxic appearing  HENT:  Head: Normocephalic and atraumatic.  Eyes: Conjunctivae are normal.  Cardiovascular: Normal rate, regular rhythm, normal heart sounds and intact distal pulses.   Hr 95 on exam  Pulmonary/Chest: Effort normal.  Abdominal: Soft. Bowel sounds are normal. He exhibits no distension. There is no tenderness. There is no rebound and no guarding.  Neurological: He is alert.  Skin: Skin is warm and dry. Capillary refill takes less than 2 seconds.  2-3cm area of fluctuance and erythema to the left buttocks that extends to the rectum. No drainage. TTP.  Psychiatric: He has a normal mood and affect.  Nursing note and vitals reviewed.    ED Treatments / Results  DIAGNOSTIC STUDIES:  Oxygen Saturation is 96% on RA, adequate by my interpretation.    COORDINATION OF CARE:  2:22 PM Will perform I&D. Discussed treatment plan with pt at bedside and pt agreed to plan.   Labs (all labs ordered are listed, but only abnormal results are displayed) Labs Reviewed  BASIC METABOLIC PANEL - Abnormal; Notable for the following:       Result Value   Potassium 3.1 (*)    Chloride 97 (*)    Glucose, Bld 119 (*)    All other components within normal limits  CBC WITH DIFFERENTIAL/PLATELET - Abnormal; Notable for the following:      WBC 12.9 (*)    Neutro Abs 9.8 (*)    All other components within normal limits  URINALYSIS, ROUTINE W REFLEX MICROSCOPIC - Abnormal; Notable for the following:    Color, Urine STRAW (*)    Hgb urine dipstick SMALL (*)    Bacteria, UA RARE (*)    All other components within normal limits  CULTURE, BLOOD (ROUTINE X 2)  CULTURE, BLOOD (ROUTINE X 2)  I-STAT CG4 LACTIC ACID, ED    EKG  EKG Interpretation None       Radiology Ct Abdomen Pelvis W Contrast  Result Date: 09/15/2016 CLINICAL DATA:  Perineal pain starting 2 a.m., history of perianal abscess EXAM: CT ABDOMEN AND PELVIS WITH CONTRAST TECHNIQUE: Multidetector CT imaging of the abdomen and pelvis was performed using the standard protocol following bolus administration of intravenous contrast. CONTRAST:  100mL ISOVUE-300 IOPAMIDOL (ISOVUE-300) INJECTION 61% COMPARISON:  11/12/2015 FINDINGS: Lower chest: Lung bases shows no acute findings Hepatobiliary: No focal liver abnormality is seen. No gallstones, gallbladder wall thickening, or biliary dilatation. Pancreas: Unremarkable. No pancreatic ductal dilatation or surrounding inflammatory changes. Spleen: Normal in size without focal abnormality. Adrenals/Urinary Tract: Adrenal glands are unremarkable. Kidneys are normal, without renal calculi, focal lesion, or hydronephrosis. Bladder is unremarkable. Delayed renal images shows bilateral renal symmetrical excretion. Bilateral visualized proximal ureter is unremarkable. The urinary bladder is unremarkable. Stomach/Bowel: No small bowel obstruction. No thickened or dilated small bowel loops. No pericecal inflammation. Normal appendix noted in axial image 60. No evidence of colitis or diverticulitis. No colonic obstruction. Vascular/Lymphatic: No aortic aneurysm. No retroperitoneal or mesenteric adenopathy. Reproductive: Prostate gland is normal size. Mild stranding of surrounding fat. Mild prostatitis cannot be excluded. There are postsurgical  changes post hernia repair in left inguinal region. Small requiring left inguinal hernia containing only fat measures 2.5 cm. Other: Axial image 64 again noted a rim enhancing collection in distal perirectal region midline lower pelvis just posterior to prostate gland measures at least 3 x 3 cm. Tiny amount of air is noted in right aspect of collection. Findings consistent with recurrent perirectal abscess. The collection is extending inferiorly just midline posterior to the anus measures about 1.9 cm best seen in axial image 86. Findings consistent with recurrent perirectal/perianal abscess. There is stranding of adjacent perianal fat and mild thickening of left levator anus muscle please see axial image 83. Sagittal images of the spine shows no destructive bony lesions. There is disc space flattening with vacuum disc phenomenon at L5-S1 level. There is a elongated mild distended urinary bladder. Mild stranding of periprostatic fat. Mild satellite prostatitis cannot be excluded. Clinical correlation is necessary. Musculoskeletal: No destructive bony lesions are noted. Sagittal images of the spine shows disc space flattening with vacuum disc phenomenon and mild anterior spurring at L5-S1 level. IMPRESSION: 1. There is a recurrent lower perirectal/perianal abscess as described above. Measures at least 3 x 3 cm. There is inferior extension posterior to the anus measures at least 1.9 cm. Mild stranding of adjacent perirectal and perineal fat. 2. The upper collection is just posterior to prostate gland. Mild satellite prostatitis cannot be excluded. Clinical correlation is necessary there is mild distended a elongated urinary bladder. 3. No ascites or free abdominal air. 4. No hydronephrosis or hydroureter. These results were called by telephone at the time of interpretation on 09/15/2016 at 4:20 pm to Dr. Demetrios LollKENNETH LEAPHART , who verbally acknowledged these results. Electronically Signed   By: Natasha MeadLiviu  Pop M.D.   On:  09/15/2016 16:20    Procedures Procedures (including critical care time)  Medications Ordered in ED Medications  piperacillin-tazobactam (ZOSYN) IVPB 3.375 g (not administered)  ondansetron (ZOFRAN) injection 4 mg (4 mg Intravenous Given 09/15/16 1435)  morphine 4 MG/ML injection 4 mg (4 mg Intravenous Given 09/15/16 1435)  sodium chloride 0.9 % bolus 1,000 mL (0 mLs Intravenous Stopped 09/15/16 1746)  piperacillin-tazobactam (ZOSYN) IVPB 3.375 g (0 g Intravenous Stopped 09/15/16 1609)  iopamidol (ISOVUE-300) 61 % injection (100 mLs  Contrast Given 09/15/16 1536)  potassium chloride SA (K-DUR,KLOR-CON) CR tablet 40 mEq (40 mEq Oral Given 09/15/16 1707)  sodium chloride 0.9 % bolus 1,000 mL (0 mLs Intravenous Stopped 09/15/16 1746)  potassium chloride 30 mEq in sodium chloride 0.9 % 265 mL (KCL MULTIRUN) IVPB (30 mEq Intravenous Given 09/15/16 1739)  fentaNYL (SUBLIMAZE) injection 50 mcg (50 mcg Intravenous Given 09/15/16 1706)  sodium chloride 0.9 % bolus 1,000 mL (1,000 mLs Intravenous New Bag/Given 09/15/16 1739)  sodium chloride 0.9 % bolus 1,000 mL (1,000 mLs Intravenous New Bag/Given 09/15/16 1738)     Initial Impression / Assessment and Plan / ED Course  I have reviewed the triage vital signs and the nursing notes.  Pertinent labs & imaging results that were available during my care of the patient were reviewed by me and considered in my medical decision making (see chart for details).     Pt presents to the ED with complaint of rectal pain. Pt with history of same and 3 I&D of perirectal abscess. Pt slightly tachycardic in triage whoever I feel that this is likely due to pain. He does have a palpable abscess to the rectum. He is afebrile. Pt was initially normotensive. Mild leukocytosis of 12,000. Lactic normal. Hypokalemia noted at 3.1. Hx of same. All other labs unremarkable. Abd exam is benign. Pt is able to urinate. Ua with small amount of hgb and bacteria. Denies urinary symptoms. Will  not treat at this time and order culture. I  am giving him a small fluid bolus given his leukocytosis as well as 10 mEq of IV potassium and 40 mEq of oral potassium for his potassium of 3.1. Will obtain Ct scan to assess for abscess.  The patient's CT scan was notable for a large 3.3 cm perianal abscess the tract all the way to the prostate as well as a smaller adjacent loculated abscess that is approximately 1.9 cm in diameter. He does have signs of possible prostatitis with elongated bladder. He technically meet septic criteria based on his leukocytosis and tachycardia with a source, and I am giving him Zosyn for empiric coverage, but he does not have severe sepsis based on his normal blood pressure and normal lactate, and thus does not require 30 mL/kg of normal saline.   Pt blood pressure has slightly decreased since receiving antibiotics and fluids. Pressure still above 90 systolic. Given decreasing bp will start 30cc/kg fluid bolus which is approx 4 L. Blood cultures were ordered however after abx were started. Still remains afebrile. His pain is still uncontrolled after morphine and I am giving him a dose of fenyl since pressures are low.  I spoke with Dr. Laurell Josephs with general surgery who came to the emergency department and talked with me in person and will admit the patient for drainage in the operating room tomorrow morning. Pt was dicussed with Dr. Clayborne Dana who is agreeable to the above plan.   Final Clinical Impressions(s) / ED Diagnoses   Final diagnoses:  Perirectal abscess  Acute prostatitis  Hypokalemia    New Prescriptions New Prescriptions   No medications on file  I personally performed the services described in this documentation, which was scribed in my presence. The recorded information has been reviewed and is accurate.     Rise Mu, PA-C 09/15/16 2111    Marily Memos, MD 09/16/16 1248

## 2016-09-15 NOTE — ED Notes (Signed)
Attempted report 

## 2016-09-15 NOTE — ED Triage Notes (Signed)
Per Pt, pt is coming from home with recurrent rectal pain that started last night. Pt has a hx of abscess noted and has had it drained three times. Denies fever, but reports decrease in urination.

## 2016-09-15 NOTE — Progress Notes (Addendum)
Pharmacy Antibiotic Note  Curtis Harper is a 46 y.o. male admitted on 09/15/2016 with perirectal abscess.  Pharmacy has been consulted for Zosyn dosing. Afebrile, labs pending.  Plan: Zosyn 3.375g IV (30min infusion) x 1 Monitor clinical progress, c/s, renal function F/u de-escalation plan/LOT F/u SCr for antibiotic maintenance dosing  Addendum: Scr is WNL. Will continue with zosyn 3.375gm IV Q8H (4 hr inf) Pharmacy will sign off as no dose adjustments are needed. Thank you for the consult!  Curtis Harper, PharmD, BCPS 09/15/2016 3:20 PM    Height: 5\' 11"  (180.3 cm) Weight: 270 lb (122.5 kg) IBW/kg (Calculated) : 75.3  Temp (24hrs), Avg:99.3 F (37.4 C), Min:99.3 F (37.4 C), Max:99.3 F (37.4 C)  No results for input(s): WBC, CREATININE, LATICACIDVEN, VANCOTROUGH, VANCOPEAK, VANCORANDOM, GENTTROUGH, GENTPEAK, GENTRANDOM, TOBRATROUGH, TOBRAPEAK, TOBRARND, AMIKACINPEAK, AMIKACINTROU, AMIKACIN in the last 168 hours.  CrCl cannot be calculated (Patient's most recent lab result is older than the maximum 21 days allowed.).    Allergies  Allergen Reactions  . Lisinopril Cough    cough    Curtis Harper, PharmD, BCPS Clinical Pharmacist 09/15/2016 2:31 PM

## 2016-09-15 NOTE — H&P (Signed)
Curtis Harper is an 46 y.o. male.   Chief Complaint: perirectal pain HPI: Curtis Harper presented to the ED today with perirectal pain that started about 1am. It woke him from sleep. He has had perirectal abscess 4 times in the past. He has undergone I&D 3 times. He came to the ED for evaluation. Workup reveals mild leukocytosis 12,900. CT scan of the abdomen and pelvis was performed which showed 3x3cm recurrent perirectal abscess. We are asked to see him for surgical management. He has not had any drainage from the area.  Past Medical History:  Diagnosis Date  . Abscess    Peri-rectal abscess  . Chicken pox   . Complication of anesthesia    oxygen sats dropped  . Difficult intubation   . ED (erectile dysfunction)   . GERD (gastroesophageal reflux disease)   . Hernia, inguinal, left   . Hyperlipidemia   . Hypertension   . Migraine   . Sleep apnea    BIPAP  . Urinary retention     Past Surgical History:  Procedure Laterality Date  . ABSCESS DRAINAGE    . BACK SURGERY  1999   L-5  . CHOLECYSTECTOMY    . HERNIA REPAIR  06/09/2016  . INCISION AND DRAINAGE ABSCESS N/A 11/12/2015   Procedure: INCISION AND DRAINAGE ABSCESS;  Surgeon: Jules Husbands, MD;  Location: [facility];  Service: General;  Laterality: N/A;  . INGUINAL HERNIA REPAIR Left 06/09/2016   Procedure: HERNIA REPAIR INGUINAL ADULT;  Surgeon: Olean Ree, MD;  Location: [facility];  Service: General;  Laterality: Left;  . LAPAROSCOPY  05/17/2016   Procedure: LAPAROSCOPY DIAGNOSTIC;  Surgeon: Olean Ree, MD;  Location: [facility];  Service: General;;  . NECK FUSION  2013  . ROTATOR CUFF REPAIR Left   . TONSILLECTOMY AND ADENOIDECTOMY      Family History  Problem Relation Age of Onset  . Stomach cancer Father     spread to lungs  . Hypertension Mother   . Hyperlipidemia Mother   . Prostate cancer Maternal Grandfather   . Colon cancer Neg Hx   . Colon polyps Neg Hx   . Esophageal cancer Neg Hx   . Rectal cancer Neg Hx     Social History:  reports that he quit smoking about 20 years ago. His smoking use included Cigarettes. He has a 5.00 pack-year smoking history. He has never used smokeless tobacco. He reports that he drinks about 1.8 oz of alcohol per week . He reports that he does not use drugs.  Allergies:  Allergies  Allergen Reactions  . Lisinopril Cough    cough     (Not in a hospital admission)  Results for orders placed or performed during the hospital encounter of 09/15/16 (from the past 48 hour(s))  Basic metabolic panel     Status: Abnormal   Collection Time: 09/15/16  2:30 PM  Result Value Ref Range   Sodium 137 135 - 145 mmol/L   Potassium 3.1 (L) 3.5 - 5.1 mmol/L   Chloride 97 (L) 101 - 111 mmol/L   CO2 27 22 - 32 mmol/L   Glucose, Bld 119 (H) 65 - 99 mg/dL   BUN 9 6 - 20 mg/dL   Creatinine, Ser 0.84 0.61 - 1.24 mg/dL   Calcium 9.4 8.9 - 10.3 mg/dL   GFR calc non Af Amer >60 >60 mL/min   GFR calc Af Amer >60 >60 mL/min    Comment: (NOTE) The eGFR has been calculated using the CKD  EPI equation. This calculation has not been validated in all clinical situations. eGFR's persistently <60 mL/min signify possible Chronic Kidney Disease.    Anion gap 13 5 - 15  CBC with Differential     Status: Abnormal   Collection Time: 09/15/16  2:30 PM  Result Value Ref Range   WBC 12.9 (H) 4.0 - 10.5 K/uL   RBC 4.56 4.22 - 5.81 MIL/uL   Hemoglobin 13.3 13.0 - 17.0 g/dL   HCT 39.4 39.0 - 52.0 %   MCV 86.4 78.0 - 100.0 fL   MCH 29.2 26.0 - 34.0 pg   MCHC 33.8 30.0 - 36.0 g/dL   RDW 12.6 11.5 - 15.5 %   Platelets 275 150 - 400 K/uL   Neutrophils Relative % 76 %   Neutro Abs 9.8 (H) 1.7 - 7.7 K/uL   Lymphocytes Relative 15 %   Lymphs Abs 2.0 0.7 - 4.0 K/uL   Monocytes Relative 7 %   Monocytes Absolute 0.9 0.1 - 1.0 K/uL   Eosinophils Relative 2 %   Eosinophils Absolute 0.2 0.0 - 0.7 K/uL   Basophils Relative 0 %   Basophils Absolute 0.0 0.0 - 0.1 K/uL  I-Stat CG4 Lactic Acid, ED      Status: None   Collection Time: 09/15/16  2:44 PM  Result Value Ref Range   Lactic Acid, Venous 1.25 0.5 - 1.9 mmol/L  Urinalysis, Routine w reflex microscopic     Status: Abnormal   Collection Time: 09/15/16  4:35 PM  Result Value Ref Range   Color, Urine STRAW (A) YELLOW   APPearance CLEAR CLEAR   Specific Gravity, Urine 1.008 1.005 - 1.030   pH 6.0 5.0 - 8.0   Glucose, UA NEGATIVE NEGATIVE mg/dL   Hgb urine dipstick SMALL (A) NEGATIVE   Bilirubin Urine NEGATIVE NEGATIVE   Ketones, ur NEGATIVE NEGATIVE mg/dL   Protein, ur NEGATIVE NEGATIVE mg/dL   Nitrite NEGATIVE NEGATIVE   Leukocytes, UA NEGATIVE NEGATIVE   RBC / HPF 0-5 0 - 5 RBC/hpf   WBC, UA 0-5 0 - 5 WBC/hpf   Bacteria, UA RARE (A) NONE SEEN   Squamous Epithelial / LPF NONE SEEN NONE SEEN   Mucous PRESENT    Ct Abdomen Pelvis W Contrast  Result Date: 09/15/2016 CLINICAL DATA:  Perineal pain starting 2 a.m., history of perianal abscess EXAM: CT ABDOMEN AND PELVIS WITH CONTRAST TECHNIQUE: Multidetector CT imaging of the abdomen and pelvis was performed using the standard protocol following bolus administration of intravenous contrast. CONTRAST:  159m ISOVUE-300 IOPAMIDOL (ISOVUE-300) INJECTION 61% COMPARISON:  11/12/2015 FINDINGS: Lower chest: Lung bases shows no acute findings Hepatobiliary: No focal liver abnormality is seen. No gallstones, gallbladder wall thickening, or biliary dilatation. Pancreas: Unremarkable. No pancreatic ductal dilatation or surrounding inflammatory changes. Spleen: Normal in size without focal abnormality. Adrenals/Urinary Tract: Adrenal glands are unremarkable. Kidneys are normal, without renal calculi, focal lesion, or hydronephrosis. Bladder is unremarkable. Delayed renal images shows bilateral renal symmetrical excretion. Bilateral visualized proximal ureter is unremarkable. The urinary bladder is unremarkable. Stomach/Bowel: No small bowel obstruction. No thickened or dilated small bowel loops. No  pericecal inflammation. Normal appendix noted in axial image 60. No evidence of colitis or diverticulitis. No colonic obstruction. Vascular/Lymphatic: No aortic aneurysm. No retroperitoneal or mesenteric adenopathy. Reproductive: Prostate gland is normal size. Mild stranding of surrounding fat. Mild prostatitis cannot be excluded. There are postsurgical changes post hernia repair in left inguinal region. Small requiring left inguinal hernia containing only fat measures 2.5 cm.  Other: Axial image 64 again noted a rim enhancing collection in distal perirectal region midline lower pelvis just posterior to prostate gland measures at least 3 x 3 cm. Tiny amount of air is noted in right aspect of collection. Findings consistent with recurrent perirectal abscess. The collection is extending inferiorly just midline posterior to the anus measures about 1.9 cm best seen in axial image 86. Findings consistent with recurrent perirectal/perianal abscess. There is stranding of adjacent perianal fat and mild thickening of left levator anus muscle please see axial image 83. Sagittal images of the spine shows no destructive bony lesions. There is disc space flattening with vacuum disc phenomenon at L5-S1 level. There is a elongated mild distended urinary bladder. Mild stranding of periprostatic fat. Mild satellite prostatitis cannot be excluded. Clinical correlation is necessary. Musculoskeletal: No destructive bony lesions are noted. Sagittal images of the spine shows disc space flattening with vacuum disc phenomenon and mild anterior spurring at L5-S1 level. IMPRESSION: 1. There is a recurrent lower perirectal/perianal abscess as described above. Measures at least 3 x 3 cm. There is inferior extension posterior to the anus measures at least 1.9 cm. Mild stranding of adjacent perirectal and perineal fat. 2. The upper collection is just posterior to prostate gland. Mild satellite prostatitis cannot be excluded. Clinical correlation  is necessary there is mild distended a elongated urinary bladder. 3. No ascites or free abdominal air. 4. No hydronephrosis or hydroureter. These results were called by telephone at the time of interpretation on 09/15/2016 at 4:20 pm to Dr. [name] , who verbally acknowledged these results. Electronically Signed   By: Lahoma Crocker M.D.   On: 09/15/2016 16:20    Review of Systems  Constitutional: Positive for malaise/fatigue. Negative for fever.  HENT: Negative.   Eyes: Negative.   Respiratory: Negative.   Cardiovascular: Negative.   Gastrointestinal: Negative for abdominal pain, nausea and vomiting.       See HPI  Genitourinary: Negative.   Musculoskeletal: Negative.   Skin: Negative.   Neurological: Negative.   Endo/Heme/Allergies: Negative.   Psychiatric/Behavioral: Negative.     Blood pressure (!) 106/56, pulse (!) 101, temperature 98.8 F (37.1 C), temperature source Rectal, resp. rate 20, height '5\' 11"'$  (1.803 m), weight 122.5 kg (270 lb), SpO2 98 %. Physical Exam  Constitutional: He is oriented to person, place, and time. He appears well-developed and well-nourished. No distress.  HENT:  Head: Normocephalic.  Right Ear: External ear normal.  Left Ear: External ear normal.  Nose: Nose normal.  Mouth/Throat: Oropharynx is clear and moist.  Eyes: EOM are normal. Pupils are equal, round, and reactive to light.  Neck: Normal range of motion. Neck supple.  Cardiovascular: Normal heart sounds and intact distal pulses.   HR 90  Respiratory: Effort normal and breath sounds normal.  GI: Soft. Bowel sounds are normal. He exhibits no distension. There is no tenderness. There is no rebound and no guarding.  Genitourinary:  Genitourinary Comments: 2 cm palpable fluctuant area to the left of the anal opening with localized tenderness, additionally there is an old scar from incision and drainage posterior to the anal opening she is mildly tender as well  Musculoskeletal: Normal range  of motion.  Neurological: He is alert and oriented to person, place, and time. He exhibits normal muscle tone.  Skin: Skin is warm and dry.  Psychiatric: He has a normal mood and affect.     Assessment/Plan Perirectal abscess - due to limited OR availability tonight, will admit, IV  Zosyn, and plan incision and drainage in the operating room tomorrow morning. I discussed the procedure, risks, and benefits with him. I will also discuss his case with Dr. Kae Heller who will be performing his surgery.  Zenovia Jarred, MD 09/15/2016, 5:48 PM

## 2016-09-16 ENCOUNTER — Inpatient Hospital Stay (HOSPITAL_COMMUNITY): Payer: Federal, State, Local not specified - PPO | Admitting: Certified Registered"

## 2016-09-16 ENCOUNTER — Encounter (HOSPITAL_COMMUNITY): Payer: Self-pay | Admitting: *Deleted

## 2016-09-16 ENCOUNTER — Encounter (HOSPITAL_COMMUNITY): Admission: EM | Disposition: A | Discharge status: Home / Self Care | Payer: Self-pay | Source: Home / Self Care

## 2016-09-16 HISTORY — PX: INCISION AND DRAINAGE PERIRECTAL ABSCESS: SHX1804

## 2016-09-16 LAB — SURGICAL PCR SCREEN
MRSA, PCR: NEGATIVE
Staphylococcus aureus: NEGATIVE

## 2016-09-16 LAB — CBC
HEMATOCRIT: 33.7 % — AB (ref 39.0–52.0)
Hemoglobin: 11 g/dL — ABNORMAL LOW (ref 13.0–17.0)
MCH: 28.8 pg (ref 26.0–34.0)
MCHC: 32.6 g/dL (ref 30.0–36.0)
MCV: 88.2 fL (ref 78.0–100.0)
PLATELETS: 249 10*3/uL (ref 150–400)
RBC: 3.82 MIL/uL — ABNORMAL LOW (ref 4.22–5.81)
RDW: 12.8 % (ref 11.5–15.5)
WBC: 8.5 10*3/uL (ref 4.0–10.5)

## 2016-09-16 LAB — BASIC METABOLIC PANEL
Anion gap: 10 (ref 5–15)
BUN: 8 mg/dL (ref 6–20)
CALCIUM: 8.5 mg/dL — AB (ref 8.9–10.3)
CO2: 27 mmol/L (ref 22–32)
CREATININE: 0.85 mg/dL (ref 0.61–1.24)
Chloride: 104 mmol/L (ref 101–111)
GFR calc Af Amer: >60 mL/min (ref >60)
GLUCOSE: 113 mg/dL — AB (ref 65–99)
POTASSIUM: 3.6 mmol/L (ref 3.5–5.1)
SODIUM: 141 mmol/L (ref 135–145)

## 2016-09-16 LAB — HIV ANTIBODY (ROUTINE TESTING W REFLEX): HIV SCREEN 4TH GENERATION: NONREACTIVE

## 2016-09-16 SURGERY — INCISION AND DRAINAGE, ABSCESS, PERIRECTAL
Anesthesia: General | Site: Rectum

## 2016-09-16 MED ORDER — ONDANSETRON HCL 4 MG/2ML IJ SOLN
INTRAMUSCULAR | Status: DC | PRN
  Administered 2016-09-16: 4 mg via INTRAVENOUS

## 2016-09-16 MED ORDER — PHENYLEPHRINE HCL 10 MG/ML IJ SOLN
INTRAMUSCULAR | Status: DC | PRN
  Administered 2016-09-16 (×2): 120 ug via INTRAVENOUS
  Administered 2016-09-16: 160 ug via INTRAVENOUS
  Administered 2016-09-16: 120 ug via INTRAVENOUS
  Administered 2016-09-16: 80 ug via INTRAVENOUS
  Administered 2016-09-16: 120 ug via INTRAVENOUS
  Administered 2016-09-16: 80 ug via INTRAVENOUS

## 2016-09-16 MED ORDER — MIDAZOLAM HCL 2 MG/2ML IJ SOLN
INTRAMUSCULAR | Status: AC
  Filled 2016-09-16: qty 2

## 2016-09-16 MED ORDER — SUCCINYLCHOLINE CHLORIDE 20 MG/ML IJ SOLN
INTRAMUSCULAR | Status: DC | PRN
  Administered 2016-09-16: 140 mg via INTRAVENOUS

## 2016-09-16 MED ORDER — FENTANYL CITRATE (PF) 100 MCG/2ML IJ SOLN
INTRAMUSCULAR | Status: DC | PRN
  Administered 2016-09-16 (×2): 50 ug via INTRAVENOUS
  Administered 2016-09-16 (×2): 25 ug via INTRAVENOUS

## 2016-09-16 MED ORDER — PROPOFOL 10 MG/ML IV BOLUS
INTRAVENOUS | Status: DC | PRN
  Administered 2016-09-16: 50 mg via INTRAVENOUS
  Administered 2016-09-16: 200 mg via INTRAVENOUS

## 2016-09-16 MED ORDER — 0.9 % SODIUM CHLORIDE (POUR BTL) OPTIME
TOPICAL | Status: DC | PRN
  Administered 2016-09-16: 1000 mL

## 2016-09-16 MED ORDER — LACTATED RINGERS IV SOLN
INTRAVENOUS | Status: DC
  Administered 2016-09-16: 10:00:00 via INTRAVENOUS

## 2016-09-16 MED ORDER — PROPOFOL 10 MG/ML IV BOLUS
INTRAVENOUS | Status: AC
  Filled 2016-09-16: qty 20

## 2016-09-16 MED ORDER — MIDAZOLAM HCL 5 MG/5ML IJ SOLN
INTRAMUSCULAR | Status: DC | PRN
  Administered 2016-09-16: 2 mg via INTRAVENOUS

## 2016-09-16 MED ORDER — LIDOCAINE HCL (CARDIAC) 20 MG/ML IV SOLN
INTRAVENOUS | Status: DC | PRN
  Administered 2016-09-16: 100 mg via INTRAVENOUS

## 2016-09-16 MED ORDER — FENTANYL CITRATE (PF) 250 MCG/5ML IJ SOLN
INTRAMUSCULAR | Status: AC
  Filled 2016-09-16: qty 5

## 2016-09-16 SURGICAL SUPPLY — 35 items
BNDG GAUZE ELAST 4 BULKY (GAUZE/BANDAGES/DRESSINGS) ×2 IMPLANT
CANISTER SUCT 3000ML PPV (MISCELLANEOUS) ×2 IMPLANT
CLEANER TIP ELECTROSURG 2X2 (MISCELLANEOUS) IMPLANT
COVER SURGICAL LIGHT HANDLE (MISCELLANEOUS) ×2 IMPLANT
DRAPE UTILITY XL STRL (DRAPES) ×4 IMPLANT
DRSG PAD ABDOMINAL 8X10 ST (GAUZE/BANDAGES/DRESSINGS) ×2 IMPLANT
ELECT REM PT RETURN 9FT ADLT (ELECTROSURGICAL) ×2
ELECTRODE REM PT RTRN 9FT ADLT (ELECTROSURGICAL) ×1 IMPLANT
GAUZE PACKING IODOFORM 1 (PACKING) IMPLANT
GAUZE SPONGE 4X4 12PLY STRL (GAUZE/BANDAGES/DRESSINGS) ×2 IMPLANT
GAUZE SPONGE 4X4 16PLY XRAY LF (GAUZE/BANDAGES/DRESSINGS) ×2 IMPLANT
GLOVE BIO SURGEON STRL SZ 6 (GLOVE) ×2 IMPLANT
GLOVE BIOGEL PI IND STRL 6.5 (GLOVE) ×1 IMPLANT
GLOVE BIOGEL PI INDICATOR 6.5 (GLOVE) ×1
GOWN STRL REUS W/ TWL LRG LVL3 (GOWN DISPOSABLE) ×1 IMPLANT
GOWN STRL REUS W/ TWL XL LVL3 (GOWN DISPOSABLE) ×1 IMPLANT
GOWN STRL REUS W/TWL LRG LVL3 (GOWN DISPOSABLE) ×1
GOWN STRL REUS W/TWL XL LVL3 (GOWN DISPOSABLE) ×1
KIT BASIN OR (CUSTOM PROCEDURE TRAY) ×2 IMPLANT
KIT ROOM TURNOVER OR (KITS) ×2 IMPLANT
NEEDLE 18GX1X1/2 (RX/OR ONLY) (NEEDLE) ×2 IMPLANT
NS IRRIG 1000ML POUR BTL (IV SOLUTION) ×2 IMPLANT
PACK LITHOTOMY IV (CUSTOM PROCEDURE TRAY) ×2 IMPLANT
PAD ARMBOARD 7.5X6 YLW CONV (MISCELLANEOUS) ×4 IMPLANT
PENCIL BUTTON HOLSTER BLD 10FT (ELECTRODE) ×2 IMPLANT
SWAB COLLECTION DEVICE MRSA (MISCELLANEOUS) IMPLANT
SYR 10ML LL (SYRINGE) ×2 IMPLANT
SYR BULB IRRIGATION 50ML (SYRINGE) ×2 IMPLANT
TOWEL OR 17X24 6PK STRL BLUE (TOWEL DISPOSABLE) ×2 IMPLANT
TOWEL OR 17X26 10 PK STRL BLUE (TOWEL DISPOSABLE) IMPLANT
TUBE ANAEROBIC SPECIMEN COL (MISCELLANEOUS) IMPLANT
TUBE CONNECTING 12X1/4 (SUCTIONS) ×2 IMPLANT
UNDERPAD 30X30 (UNDERPADS AND DIAPERS) ×2 IMPLANT
WATER STERILE IRR 1000ML POUR (IV SOLUTION) IMPLANT
YANKAUER SUCT BULB TIP NO VENT (SUCTIONS) ×2 IMPLANT

## 2016-09-16 NOTE — Anesthesia Postprocedure Evaluation (Signed)
Anesthesia Post Note  Patient: Curtis Harper  Procedure(s) Performed: Procedure(s) (LRB): IRRIGATION AND DEBRIDEMENT PERIRECTAL ABSCESS (N/A)  Patient location during evaluation: PACU Anesthesia Type: General Level of consciousness: awake and alert Pain management: pain level controlled Vital Signs Assessment: post-procedure vital signs reviewed and stable Respiratory status: spontaneous breathing, nonlabored ventilation and respiratory function stable Cardiovascular status: blood pressure returned to baseline and stable Postop Assessment: no signs of nausea or vomiting Anesthetic complications: no       Last Vitals:  Vitals:   09/16/16 1130 09/16/16 1145  BP: (!) 104/53 (!) 94/46  Pulse: 93 92  Resp: 17 18  Temp: 36.9 C     Last Pain:  Vitals:   09/16/16 1145  TempSrc:   PainSc: 0-No pain                 Lowella Curb

## 2016-09-16 NOTE — Transfer of Care (Signed)
Immediate Anesthesia Transfer of Care Note  Patient: Curtis Harper  Procedure(s) Performed: Procedure(s): IRRIGATION AND DEBRIDEMENT PERIRECTAL ABSCESS (N/A)  Patient Location: PACU  Anesthesia Type:General  Level of Consciousness: awake, alert , oriented and patient cooperative  Airway & Oxygen Therapy: Patient Spontanous Breathing and Patient connected to face mask oxygen  Post-op Assessment: Report given to RN and Post -op Vital signs reviewed and stable  Post vital signs: Reviewed and stable  Last Vitals:  Vitals:   09/15/16 2226 09/16/16 0557  BP: (!) 105/55 (!) 104/52  Pulse: 98 83  Resp: 20 20  Temp: 37.2 C 37 C    Last Pain:  Vitals:   09/16/16 0557  TempSrc: Oral  PainSc:          Complications: No apparent anesthesia complications

## 2016-09-16 NOTE — Progress Notes (Signed)
Day of Surgery  Subjective: No acute events. No complaints.   Objective: Vital signs in last 24 hours: Temp:  [98.6 F (37 C)-98.9 F (37.2 C)] 98.6 F (37 C) (03/30 0557) Pulse Rate:  [83-102] 83 (03/30 0557) Resp:  [15-20] 20 (03/30 0557) BP: (103-116)/(46-78) 104/52 (03/30 0557) SpO2:  [94 %-98 %] 97 % (03/30 0557) Last BM Date: 09/14/16  Intake/Output from previous day: 03/29 0701 - 03/30 0700 In: 4468.3 [I.V.:318.3; IV Piggyback:4150] Out: 500 [Urine:500] Intake/Output this shift: No intake/output data recorded.  Alert, cooperative Abdomen benign Rectal exam deferred for OR  Lab Results:   Recent Labs  09/15/16 1430 09/16/16 0518  WBC 12.9* 8.5  HGB 13.3 11.0*  HCT 39.4 33.7*  PLT 275 249   BMET  Recent Labs  09/15/16 1430 09/16/16 0518  NA 137 141  K 3.1* 3.6  CL 97* 104  CO2 27 27  GLUCOSE 119* 113*  BUN 9 8  CREATININE 0.84 0.85  CALCIUM 9.4 8.5*   PT/INR No results for input(s): LABPROT, INR in the last 72 hours. ABG No results for input(s): PHART, HCO3 in the last 72 hours.  Invalid input(s): PCO2, PO2  Studies/Results: Ct Abdomen Pelvis W Contrast  Result Date: 09/15/2016 CLINICAL DATA:  Perineal pain starting 2 a.m., history of perianal abscess EXAM: CT ABDOMEN AND PELVIS WITH CONTRAST TECHNIQUE: Multidetector CT imaging of the abdomen and pelvis was performed using the standard protocol following bolus administration of intravenous contrast. CONTRAST:  ISOVUE-300 IOPAMIDOL (ISOVUE-300) INJECTION 61% COMPARISON:  11/12/2015 FINDINGS: Lower chest: Lung bases shows no acute findings Hepatobiliary: No focal liver abnormality is seen. No gallstones, gallbladder wall thickening, or biliary dilatation. Pancreas: Unremarkable. No pancreatic ductal dilatation or surrounding inflammatory changes. Spleen: Normal in size without focal abnormality. Adrenals/Urinary Tract: Adrenal glands are unremarkable. Kidneys are normal, without renal calculi,  focal lesion, or hydronephrosis. Bladder is unremarkable. Delayed renal images shows bilateral renal symmetrical excretion. Bilateral visualized proximal ureter is unremarkable. The urinary bladder is unremarkable. Stomach/Bowel: No small bowel obstruction. No thickened or dilated small bowel loops. No pericecal inflammation. Normal appendix noted in axial image 60. No evidence of colitis or diverticulitis. No colonic obstruction. Vascular/Lymphatic: No aortic aneurysm. No retroperitoneal or mesenteric adenopathy. Reproductive: Prostate gland is normal size. Mild stranding of surrounding fat. Mild prostatitis cannot be excluded. There are postsurgical changes post hernia repair in left inguinal region. Small requiring left inguinal hernia containing only fat measures 2.5 cm. Other: Axial image 64 again noted a rim enhancing collection in distal perirectal region midline lower pelvis just posterior to prostate gland measures at least 3 x 3 cm. Tiny amount of air is noted in right aspect of collection. Findings consistent with recurrent perirectal abscess. The collection is extending inferiorly just midline posterior to the anus measures about 1.9 cm best seen in axial image 86. Findings consistent with recurrent perirectal/perianal abscess. There is stranding of adjacent perianal fat and mild thickening of left levator anus muscle please see axial image 83. Sagittal images of the spine shows no destructive bony lesions. There is disc space flattening with vacuum disc phenomenon at L5-S1 level. There is a elongated mild distended urinary bladder. Mild stranding of periprostatic fat. Mild satellite prostatitis cannot be excluded. Clinical correlation is necessary. Musculoskeletal: No destructive bony lesions are noted. Sagittal images of the spine shows disc space flattening with vacuum disc phenomenon and mild anterior spurring at L5-S1 level. IMPRESSION: 1. There is a recurrent lower perirectal/perianal abscess as  described above.  Measures at least 3 x 3 cm. There is inferior extension posterior to the anus measures at least 1.9 cm. Mild stranding of adjacent perirectal and perineal fat. 2. The upper collection is just posterior to prostate gland. Mild satellite prostatitis cannot be excluded. Clinical correlation is necessary there is mild distended a elongated urinary bladder. 3. No ascites or free abdominal air. 4. No hydronephrosis or hydroureter. These results were called by telephone at the time of interpretation on 09/15/2016 at 4:20 pm to Dr. Demetrios Loll , who verbally acknowledged these results. Electronically Signed   By: Natasha Mead M.D.   On: 09/15/2016 16:20    Anti-infectives: Anti-infectives    Start     Dose/Rate Route Frequency Ordered Stop   09/15/16 2200  [MAR Hold]  piperacillin-tazobactam (ZOSYN) IVPB 3.375 g     (MAR Hold since 09/16/16 0942)   3.375 g 12.5 mL/hr over 240 Minutes Intravenous Every 8 hours 09/15/16 1519     09/15/16 1445  piperacillin-tazobactam (ZOSYN) IVPB 3.375 g     3.375 g 100 mL/hr over 30 Minutes Intravenous  Once 09/15/16 1431 09/15/16 1609      Assessment/Plan: Perirectal abscess, this will be third I&D.  To OR this morning.   LOS: 1 day    Curtis Harper 09/16/2016

## 2016-09-16 NOTE — Anesthesia Preprocedure Evaluation (Addendum)
Anesthesia Evaluation  Patient identified by MRN, date of birth, ID band Patient awake    Reviewed: Allergy & Precautions, H&P , NPO status , Patient's Chart, lab work & pertinent test results, reviewed documented beta blocker date and time   History of Anesthesia Complications (+) DIFFICULT AIRWAY and history of anesthetic complications  Airway Mallampati: III  TM Distance: >3 FB Neck ROM: full    Dental no notable dental hx. (+) Poor Dentition, Chipped   Pulmonary neg shortness of breath, sleep apnea and Continuous Positive Airway Pressure Ventilation , former smoker,    Pulmonary exam normal breath sounds clear to auscultation       Cardiovascular Exercise Tolerance: Good hypertension, Pt. on medications and Pt. on home beta blockers (-) angina(-) Past MI and (-) DOE Normal cardiovascular exam Rhythm:regular Rate:Normal     Neuro/Psych  Headaches, negative psych ROS   GI/Hepatic Neg liver ROS, GERD  Controlled,  Endo/Other  negative endocrine ROS  Renal/GU negative Renal ROS  negative genitourinary   Musculoskeletal negative musculoskeletal ROS (+)   Abdominal   Peds negative pediatric ROS (+)  Hematology negative hematology ROS (+)   Anesthesia Other Findings Past Medical History: No date: Abscess     Comment: Peri-rectal abscess No date: Chicken pox No date: ED (erectile dysfunction) No date: GERD (gastroesophageal reflux disease) No date: Hernia, inguinal, left No date: Hyperlipidemia No date: Hypertension No date: Migraine No date: Sleep apnea     Comment: BIPAP No date: Urinary retention  Past Surgical History: No date: ABSCESS DRAINAGE 1999: BACK SURGERY     Comment: L-5 No date: CHOLECYSTECTOMY 11/12/2015: INCISION AND DRAINAGE ABSCESS N/A     Comment: Procedure: INCISION AND DRAINAGE ABSCESS;                Surgeon: Leafy Ro, MD;  Location: [facility]               ORS;  Service: General;   Laterality: N/A; 2013: NECK FUSION No date: ROTATOR CUFF REPAIR Left No date: TONSILLECTOMY AND ADENOIDECTOMY  BMI    Body Mass Index:  37.02 kg/m      Reproductive/Obstetrics negative OB ROS                             Anesthesia Physical  Anesthesia Plan  ASA: II  Anesthesia Plan: General   Post-op Pain Management:    Induction: Intravenous  Airway Management Planned: LMA  Additional Equipment:   Intra-op Plan:   Post-operative Plan: Extubation in OR  Informed Consent: I have reviewed the patients History and Physical, chart, labs and discussed the procedure including the risks, benefits and alternatives for the proposed anesthesia with the patient or authorized representative who has indicated his/her understanding and acceptance.   Dental Advisory Given  Plan Discussed with: Anesthesiologist, CRNA and Surgeon  Anesthesia Plan Comments:        Anesthesia Quick Evaluation

## 2016-09-16 NOTE — Op Note (Signed)
Operative Note  Curtis Harper  [id number]  [id number]  09/16/2016   Surgeon: Berna Bue  Assistant: OR staff  Procedure performed: Rectal exam under anesthesia, incision and drainage perirectal abscess  Preop diagnosis: perirectal abscess Post-op diagnosis/intraop findings: same, much of the CT-noted abscess was not accessible   Specimens: none Retained items: packing to be changed tomorrow EBL: 5cccc Complications: none  Description of procedure: After obtaining informed consent the patient was taken to the operating room and placed supine on operating room table wheregeneral endotracheal anesthesia was initiated, preoperative antibiotics were administered, SCDs applied, and a formal timeout was performed. He was placed in lithotomy and the perineum prepped and draped in the usual sterile fashion. External exam revealed the fluctuant area to the left of the anus but no other external abnormalities. Digital rectal exam revealed firmness/induration of the rectal wall on the left side and posteriorly, above the level of the sphincters. No overt fluctuance. A speculum was placed and internal hemorrhoids were noted, but no obvious fistula. A 15 blade was used to incise the skin over the previously noted external fluctuant area. There was no gross purulent fluid expressed. The wound was probed gently with a kelly and while there was a small dead space here, no further loculations nor communication with the abnormality palpable on the left perirectal space could be elicited. I then attempted to aspirate the areas of induration around the external anus with an 18g needle but could not access any fluid. The skin wound was packed with saline moistened kerlix. Gauze fluffs and mesh panties were placed.  The patient was then awakened, extubated and taken to PACU in stable condition.   All counts were correct at the completion of the case.

## 2016-09-16 NOTE — Anesthesia Procedure Notes (Signed)
Procedure Name: Intubation Date/Time: 09/16/2016 10:42 AM Performed by: Shirlyn Goltz Pre-anesthesia Checklist: Patient identified, Emergency Drugs available, Suction available and Patient being monitored Patient Re-evaluated:Patient Re-evaluated prior to inductionOxygen Delivery Method: Circle system utilized Preoxygenation: Pre-oxygenation with 100% oxygen Intubation Type: IV induction Ventilation: Mask ventilation without difficulty and Oral airway inserted - appropriate to patient size Laryngoscope Size: Mac and 4 Grade View: Grade III Tube type: Oral Tube size: 7.5 mm Number of attempts: 1 Airway Equipment and Method: Stylet Placement Confirmation: ETT inserted through vocal cords under direct vision,  positive ETCO2 and breath sounds checked- equal and bilateral Secured at: 22 cm Tube secured with: Tape Dental Injury: Teeth and Oropharynx as per pre-operative assessment

## 2016-09-17 ENCOUNTER — Encounter (HOSPITAL_COMMUNITY): Payer: Self-pay | Admitting: Surgery

## 2016-09-17 MED ORDER — PHENOL 1.4 % MT LIQD
1.0000 | OROMUCOSAL | Status: DC | PRN
  Administered 2016-09-17: 1 via OROMUCOSAL
  Filled 2016-09-17: qty 177

## 2016-09-17 MED ORDER — DOCUSATE SODIUM 100 MG PO CAPS
100.0000 mg | ORAL_CAPSULE | Freq: Every day | ORAL | Status: DC
  Administered 2016-09-17: 100 mg via ORAL
  Filled 2016-09-17 (×2): qty 1

## 2016-09-17 NOTE — Progress Notes (Signed)
1 Day Post-Op   Subjective: He feels much better today with essentially relief of his pain.  Objective: Vital signs in last 24 hours: Temp:  [98.4 F (36.9 C)-99.2 F (37.3 C)] 98.8 F (37.1 C) (03/31 0431) Pulse Rate:  [86-94] 88 (03/31 0431) Resp:  [17-18] 18 (03/31 0431) BP: (94-133)/(46-78) 125/68 (03/31 0431) SpO2:  [93 %-100 %] 96 % (03/31 0431) Last BM Date: 09/16/16  Intake/Output from previous day: 03/30 0701 - 03/31 0700 In: 2775.5 [P.O.:822; I.V.:1903.5; IV Piggyback:50] Out: 5 [Blood:5] Intake/Output this shift: No intake/output data recorded.  General appearance: alert, cooperative, no distress and morbidly obese Rectal exam shows packing in place with slight bloody and no purulent drainage. No significant induration or tenderness or erythema  Lab Results:   Recent Labs  09/15/16 1430 09/16/16 0518  WBC 12.9* 8.5  HGB 13.3 11.0*  HCT 39.4 33.7*  PLT 275 249   BMET  Recent Labs  09/15/16 1430 09/16/16 0518  NA 137 141  K 3.1* 3.6  CL 97* 104  CO2 27 27  GLUCOSE 119* 113*  BUN 9 8  CREATININE 0.84 0.85  CALCIUM 9.4 8.5*     Studies/Results: Ct Abdomen Pelvis W Contrast  Result Date: 09/15/2016 CLINICAL DATA:  Perineal pain starting 2 a.m., history of perianal abscess EXAM: CT ABDOMEN AND PELVIS WITH CONTRAST TECHNIQUE: Multidetector CT imaging of the abdomen and pelvis was performed using the standard protocol following bolus administration of intravenous contrast. CONTRAST:  ISOVUE-300 IOPAMIDOL (ISOVUE-300) INJECTION 61% COMPARISON:  11/12/2015 FINDINGS: Lower chest: Lung bases shows no acute findings Hepatobiliary: No focal liver abnormality is seen. No gallstones, gallbladder wall thickening, or biliary dilatation. Pancreas: Unremarkable. No pancreatic ductal dilatation or surrounding inflammatory changes. Spleen: Normal in size without focal abnormality. Adrenals/Urinary Tract: Adrenal glands are unremarkable. Kidneys are normal, without  renal calculi, focal lesion, or hydronephrosis. Bladder is unremarkable. Delayed renal images shows bilateral renal symmetrical excretion. Bilateral visualized proximal ureter is unremarkable. The urinary bladder is unremarkable. Stomach/Bowel: No small bowel obstruction. No thickened or dilated small bowel loops. No pericecal inflammation. Normal appendix noted in axial image 60. No evidence of colitis or diverticulitis. No colonic obstruction. Vascular/Lymphatic: No aortic aneurysm. No retroperitoneal or mesenteric adenopathy. Reproductive: Prostate gland is normal size. Mild stranding of surrounding fat. Mild prostatitis cannot be excluded. There are postsurgical changes post hernia repair in left inguinal region. Small requiring left inguinal hernia containing only fat measures 2.5 cm. Other: Axial image 64 again noted a rim enhancing collection in distal perirectal region midline lower pelvis just posterior to prostate gland measures at least 3 x 3 cm. Tiny amount of air is noted in right aspect of collection. Findings consistent with recurrent perirectal abscess. The collection is extending inferiorly just midline posterior to the anus measures about 1.9 cm best seen in axial image 86. Findings consistent with recurrent perirectal/perianal abscess. There is stranding of adjacent perianal fat and mild thickening of left levator anus muscle please see axial image 83. Sagittal images of the spine shows no destructive bony lesions. There is disc space flattening with vacuum disc phenomenon at L5-S1 level. There is a elongated mild distended urinary bladder. Mild stranding of periprostatic fat. Mild satellite prostatitis cannot be excluded. Clinical correlation is necessary. Musculoskeletal: No destructive bony lesions are noted. Sagittal images of the spine shows disc space flattening with vacuum disc phenomenon and mild anterior spurring at L5-S1 level. IMPRESSION: 1. There is a recurrent lower  perirectal/perianal abscess as described above. Measures  at least 3 x 3 cm. There is inferior extension posterior to the anus measures at least 1.9 cm. Mild stranding of adjacent perirectal and perineal fat. 2. The upper collection is just posterior to prostate gland. Mild satellite prostatitis cannot be excluded. Clinical correlation is necessary there is mild distended a elongated urinary bladder. 3. No ascites or free abdominal air. 4. No hydronephrosis or hydroureter. These results were called by telephone at the time of interpretation on 09/15/2016 at 4:20 pm to Dr. Demetrios Loll , who verbally acknowledged these results. Electronically Signed   By: Natasha Mead M.D.   On: 09/15/2016 16:20    Anti-infectives: Anti-infectives    Start     Dose/Rate Route Frequency Ordered Stop   09/15/16 2200  piperacillin-tazobactam (ZOSYN) IVPB 3.375 g     3.375 g 12.5 mL/hr over 240 Minutes Intravenous Every 8 hours 09/15/16 1519     09/15/16 1445  piperacillin-tazobactam (ZOSYN) IVPB 3.375 g     3.375 g 100 mL/hr over 30 Minutes Intravenous  Once 09/15/16 1431 09/15/16 1609      Assessment/Plan: s/p Procedure(s): IRRIGATION AND DEBRIDEMENT PERIRECTAL ABSCESS Recurrent perirectal abscess. Dr. Doylene Canard indicated no significant purulent pocket found at the time of exploration. Patient is much improved, possibly mainly due to antibiotics. Plan to continue IV antibiotics today and if continues to do well discharge home tomorrow on oral antibiotics with close follow-up.   LOS: 2 days    Curtis Harper T 3/31/2018Patient ID: Curtis Harper, male   DOB: [birth year], 46 y.o.   MRN: [id number]

## 2016-09-18 MED ORDER — AMOXICILLIN-POT CLAVULANATE 875-125 MG PO TABS: 1.0000 | ORAL_TABLET | Freq: Two times a day (BID) | ORAL | 3 refills | Status: DC

## 2016-09-18 NOTE — Discharge Summary (Signed)
  Patient ID: Curtis Harper [id number] 46 y.o. 02/14/1971  09/15/2016  Discharge date and time: 09/18/2016   Admitting Physician: Violeta Gelinas  Discharge Physician: Glenna Fellows T  Admission Diagnoses:  Perirectal abscess [K61.1]   Discharge Diagnoses: Same  Operations: Procedure(s): IRRIGATION AND DEBRIDEMENT PERIRECTAL ABSCESS, Dr. Twana First  Admission Condition: fair  Discharged Condition: good  Indication for Admission: Patient presents with the fairly acute onset of perirectal pain within the last 24 hours. He has a history of 4 previous perirectal abscesses and 3 incision and drainage procedures. Workup showed a mild leukocytosis and CT scan showed evidence of a 3 cm posterior perirectal abscess. He was admitted for antibiotics and surgical drainage  Hospital Course: Patient was started on IV Zosyn. Taken to the operating room on 09/16/2016 by Dr. Doylene Canard.  Surgical exploration was significant for encountering a small dead space but no gross purulent fluid was obtained with exploration. Postoperatively however the patient had significant relief. The following day his white blood count was normal and he had mild pain. He was continued on IV antibiotics another 24 hours at that point had just some mild soreness. No perirectal induration or tenderness or fluctuance noted with a clean wound and is felt ready for discharge.   Disposition: Home  Patient Instructions:  Allergies as of 09/18/2016      Reactions   Lisinopril Cough      Medication List    TAKE these medications   ADVIL PM 200-38 MG Tabs Generic drug:  Ibuprofen-Diphenhydramine Cit Take 2 tablets by mouth at bedtime as needed (sleep).   amoxicillin-clavulanate 875-125 MG tablet Commonly known as:  AUGMENTIN Take 1 tablet by mouth 2 (two) times daily.   metoprolol succinate 25 MG 24 hr tablet Commonly known as:  TOPROL-XL TAKE 1 TABLET BY MOUTH EVERY DAY   OVER THE COUNTER MEDICATION Take 1  application by mouth at bedtime. BIPAP machine   oxymetazoline 0.05 % nasal spray Commonly known as:  AFRIN Place 1 spray into both nostrils 2 (two) times daily as needed for congestion.   pantoprazole 40 MG tablet Commonly known as:  PROTONIX Take 1 tablet (40 mg total) by mouth 2 (two) times daily before a meal.   polyvinyl alcohol 1.4 % ophthalmic solution Commonly known as:  LIQUIFILM TEARS Place 1 drop into both eyes as needed for dry eyes.   sildenafil 100 MG tablet Commonly known as:  VIAGRA Take 0.5-1 tablets (50-100 mg total) by mouth daily as needed for erectile dysfunction.   valsartan-hydrochlorothiazide 320-25 MG tablet Commonly known as:  DIOVAN-HCT Take 1 tablet by mouth 1 day or 1 dose.       Activity: activity as tolerated Diet: regular diet Wound Care: Sitz bath or shower twice daily and after bowel movements  Follow-up:  With Dr. Doylene Canard in 2 weeks.  Signed: Mariella Saa MD, FACS  09/18/2016, 8:16 AM

## 2016-09-18 NOTE — Discharge Instructions (Addendum)
[facility]__[facility]  RECTAL SURGERY POST OP INSTRUCTIONS: POST OP INSTRUCTIONS  Always review your discharge instruction sheet given to you by the facility where your surgery was performed. IF YOU HAVE DISABILITY OR FAMILY LEAVE FORMS, YOU MUST BRING THEM TO THE OFFICE FOR PROCESSING.   DO NOT GIVE THEM TO YOUR DOCTOR.  1. A  prescription for pain medication may be given to you upon discharge.  Take your pain medication as prescribed, if needed.  If narcotic pain medicine is not needed, then you may take acetaminophen (Tylenol) or ibuprofen (Advil) as needed. 2. Take your usually prescribed medications unless otherwise directed. 3. If you need a refill on your pain medication, please contact your pharmacy.  They will contact our office to request authorization. Prescriptions will not be filled after 5 pm or on week-ends. 4. You should follow a light diet the first 48 hours after arrival home, such as soup and crackers, etc.  Be sure to include lots of fluids daily.  Resume your normal diet 2-3 days after surgery.. 5. Most patients will experience some swelling and discomfort in the rectal area. Ice packs, reclining and warm tub soaks will help.  Swelling and discomfort can take several days to resolve.  6. It is common to experience some constipation if taking pain medication after surgery.  Increasing fluid intake and taking a stool softener (such as Colace) will usually help or prevent this problem from occurring.  A mild laxative (Milk of Magnesia or Miralax) should be taken according to package directions if there are no bowel movements after 48 hours. 7. Unless discharge instructions indicate otherwise, leave your bandage dry and in place for 24 hours, or remove the bandage if you have a bowel movement. You may notice a small amount of bleeding with bowel movements for the first few days. You may have some packing in the rectum which will come out over the first day or two. You  will need to wear an absorbent pad or soft cotton gauze in your underwear until the drainage stops.it. 8. ACTIVITIES:  You may resume regular (light) daily activities beginning the next day--such as daily self-care, walking, climbing stairs--gradually increasing activities as tolerated.  You may have sexual intercourse when it is comfortable.  Refrain from any heavy lifting or straining until approved by your doctor. a. You may drive when you are no longer taking prescription pain medication, you can comfortably wear a seatbelt, and you can safely maneuver your car and apply brakes. b. RETURN TO WORK: : ______ may return to work on Monday, 09/19/2016 without restrictions ______________ c.  9. You should see your doctor in the office for a follow-up appointment approximately 2-3 weeks after your surgery.  Make sure that you call for this appointment within a day or two after you arrive home to insure a convenient appointment time. 10. OTHER INSTRUCTIONS:  __________________________________________________________________________________________________________________________________________________________________________________________  WHEN TO CALL YOUR DOCTOR: 1. Fever over 101.0 2. Inability to urinate 3. Nausea and/or vomiting 4. Extreme swelling or bruising 5. Continued bleeding from rectum. 6. Increased pain, redness, or drainage from the incision 7. Constipation  The clinic staff is available to answer your questions during regular business hours.  Please dont hesitate to call and ask to speak to one of the nurses for clinical concerns.  If you have a medical emergency, go to the nearest emergency room or call 911.  A surgeon from [facility] is always on call at the hospital  [address] [place], [place], [place]  [address] ?  [address], [place], [place]   [address] [phone] ? [phone] ? FAX [phone] Web site:  [email or web address]

## 2016-09-18 NOTE — Progress Notes (Signed)
Patient ID: Curtis Harper, male   DOB: 05/31/1971, 46 y.o.   MRN: [id number]  2 Days Post-Op   Subjective: No pain. Just a little sore. Had a bowel movement. The packing has come out.  Objective: Vital signs in last 24 hours: Temp:  [97.8 F (36.6 C)-99.1 F (37.3 C)] 97.8 F (36.6 C) (04/01 0444) Pulse Rate:  [72-94] 72 (04/01 0444) Resp:  [17-18] 18 (04/01 0444) BP: (106-118)/(61-75) 109/65 (04/01 0444) SpO2:  [92 %-100 %] 92 % (04/01 0444) Last BM Date: 09/17/16  Intake/Output from previous day: 03/31 0701 - 04/01 0700 In: 1562.8 [P.O.:780; I.V.:582.8; IV Piggyback:200] Out: -  Intake/Output this shift: No intake/output data recorded.  General appearance: alert, cooperative and no distress Incision/Wound: Wound clean without any unusual drainage. No apparent induration or erythema or much tenderness  Lab Results:   Recent Labs  09/15/16 1430 09/16/16 0518  WBC 12.9* 8.5  HGB 13.3 11.0*  HCT 39.4 33.7*  PLT 275 249   BMET  Recent Labs  09/15/16 1430 09/16/16 0518  NA 137 141  K 3.1* 3.6  CL 97* 104  CO2 27 27  GLUCOSE 119* 113*  BUN 9 8  CREATININE 0.84 0.85  CALCIUM 9.4 8.5*     Studies/Results: No results found.  Anti-infectives: Anti-infectives    Start     Dose/Rate Route Frequency Ordered Stop   09/15/16 2200  piperacillin-tazobactam (ZOSYN) IVPB 3.375 g     3.375 g 12.5 mL/hr over 240 Minutes Intravenous Every 8 hours 09/15/16 1519     09/15/16 1445  piperacillin-tazobactam (ZOSYN) IVPB 3.375 g     3.375 g 100 mL/hr over 30 Minutes Intravenous  Once 09/15/16 1431 09/15/16 1609      Assessment/Plan: s/p Procedure(s): IRRIGATION AND DEBRIDEMENT PERIRECTAL ABSCESS Doing well and much improved from admission. Okay for discharge on follow-up oral antibiotics.    LOS: 3 days    Aleina Burgio T 09/18/2016

## 2016-09-18 NOTE — Progress Notes (Signed)
Discharge instructions gone over with patient. Home medications discussed. Antibiotic therapy discussed. Prescription given. Follow up appointment to be made. Diet, activity, and incisional care discussed. Reasons to call the doctor gone over. Patient verbalized understanding of instructions.

## 2016-09-19 ENCOUNTER — Telehealth: Payer: Self-pay | Admitting: Family Medicine

## 2016-09-19 ENCOUNTER — Ambulatory Visit (INDEPENDENT_AMBULATORY_CARE_PROVIDER_SITE_OTHER): Payer: Federal, State, Local not specified - PPO

## 2016-09-19 ENCOUNTER — Ambulatory Visit (INDEPENDENT_AMBULATORY_CARE_PROVIDER_SITE_OTHER): Payer: Federal, State, Local not specified - PPO | Admitting: Family Medicine

## 2016-09-19 ENCOUNTER — Encounter: Payer: Self-pay | Admitting: *Deleted

## 2016-09-19 VITALS — BP 156/101 | HR 94 | Temp 98.4°F | Wt 276.2 lb

## 2016-09-19 DIAGNOSIS — J988 Other specified respiratory disorders: Secondary | ICD-10-CM | POA: Diagnosis not present

## 2016-09-19 DIAGNOSIS — J069 Acute upper respiratory infection, unspecified: Secondary | ICD-10-CM | POA: Diagnosis not present

## 2016-09-19 DIAGNOSIS — R05 Cough: Secondary | ICD-10-CM | POA: Diagnosis not present

## 2016-09-19 LAB — POC INFLUENZA A&B (BINAX/QUICKVUE)
Influenza A, POC: NEGATIVE
Influenza B, POC: NEGATIVE

## 2016-09-19 MED ORDER — HYDROCOD POLST-CPM POLST ER 10-8 MG/5ML PO SUER: 5.0000 mL | Freq: Two times a day (BID) | ORAL | 0 refills | Status: DC | PRN

## 2016-09-19 MED ORDER — PREDNISONE 50 MG PO TABS: ORAL_TABLET | ORAL | 0 refills | Status: DC

## 2016-09-19 NOTE — Telephone Encounter (Signed)
Pt called in with Wheezing/coughing/SOB/. I transferred pt to [facility]. Thank you!

## 2016-09-19 NOTE — Assessment & Plan Note (Signed)
New problem. Flu negative. Xray today negative. Treating with tussionex and prednisone.

## 2016-09-19 NOTE — Telephone Encounter (Signed)
Scheduled for 11/15

## 2016-09-19 NOTE — Progress Notes (Signed)
Subjective:  Patient ID: Curtis Harper, male    DOB: 10/11/1970  Age: 46 y.o. MRN: [id number]  CC: Cough, wheezing  HPI:  46 year old male presents with the above complaints.  Patient states that he's been sick since Saturday. He's had a mildly productive cough and associated wheezing. No fever. He has had night sweats. Associated shortness of breath and body aches. No known exacerbating or relieving factors. Symptoms are severe. No other associated symptoms. No other complaints this time.  Social Hx   Social History   Social History  . Marital status: Divorced    Spouse name: N/A  . Number of children: 4  . Years of education: N/A   Occupational History  . truck driver    Social History Main Topics  . Smoking status: Former Smoker    Packs/day: 0.50    Years: 10.00    Types: Cigarettes    Quit date: 05/11/1996  . Smokeless tobacco: Never Used  . Alcohol use 1.8 oz/week    1 Glasses of wine, 2 Cans of beer per week     Comment: occ  . Drug use: No  . Sexual activity: Yes    Partners: Female   Other Topics Concern  . Not on file   Social History Narrative  . No narrative on file    Review of Systems  Constitutional: Positive for fatigue.  Respiratory: Positive for cough and wheezing.   Musculoskeletal:       Body aches.   Objective:  BP (!) 156/101   Pulse 94   Temp 98.4 F (36.9 C) (Oral)   Wt 276 lb 4 oz (125.3 kg)   SpO2 97%   BMI 38.53 kg/m   BP/Weight 09/19/2016 09/18/2016 09/15/2016  Systolic BP 156 109 -  Diastolic BP 101 65 -  Wt. (Lbs) 276.25 - 270  BMI 38.53 - 37.66    Physical Exam  Constitutional: He is oriented to person, place, and time.  Appears fatigued/mildly ill.  HENT:  Mouth/Throat: Oropharynx is clear and moist.  Neck: Neck supple.  Cardiovascular: Regular rhythm.   Tachycardic.  Pulmonary/Chest:  Scattered wheezing.  Neurological: He is alert and oriented to person, place, and time.  Vitals reviewed.   Lab Results    Component Value Date   WBC 8.5 09/16/2016   HGB 11.0 (L) 09/16/2016   HCT 33.7 (L) 09/16/2016   PLT 249 09/16/2016   GLUCOSE 113 (H) 09/16/2016   CHOL 203 (H) 06/21/2016   TRIG 213.0 (H) 06/21/2016   HDL 50.40 06/21/2016   LDLDIRECT 120.0 06/21/2016   LDLCALC 117 (H) 03/23/2016   ALT 17 03/23/2016   AST 15 03/23/2016   NA 141 09/16/2016   K 3.6 09/16/2016   CL 104 09/16/2016   CREATININE 0.85 09/16/2016   BUN 8 09/16/2016   CO2 27 09/16/2016   PSA 1.13 03/23/2016   HGBA1C 6.1 06/21/2016    Assessment & Plan:   Problem List Items Addressed This Visit    Respiratory infection - Primary    New problem. Flu negative. Xray today negative. Treating with tussionex and prednisone.      Relevant Orders   DG Chest 2 View (Completed)   POC Influenza A&B(BINAX/QUICKVUE) (Completed)      Meds ordered this encounter  Medications  . chlorpheniramine-HYDROcodone (TUSSIONEX PENNKINETIC ER) 10-8 MG/5ML SUER    Sig: Take 5 mLs by mouth every 12 (twelve) hours as needed.    Dispense:  115 mL    Refill:  0  . predniSONE (DELTASONE) 50 MG tablet    Sig: 1 tablet daily x 5 days.    Dispense:  5 tablet    Refill:  0    Follow-up: PRN  Everlene Other DO [facility]

## 2016-09-19 NOTE — Patient Instructions (Signed)
Medications as prescribed.  We will call with the xray results.  Take care  Dr. Adriana Simas

## 2016-09-19 NOTE — Telephone Encounter (Signed)
Scheduled with PCP at 11:15

## 2016-09-19 NOTE — Telephone Encounter (Signed)
[facility] - [place] TELEPHONE ADVICE RECORD [facility]  Patient Name: Curtis Harper  DOB: 1971-04-29    Initial Comment caller states he is wheezing an having Shortness of breath. been going on since late saturday   Nurse Assessment  Nurse: Laural Benes, RN, Dondra Spry Date/Time Curtis Harper Time): 09/19/2016 8:53:33 AM  Confirm and document reason for call. If symptomatic, describe symptoms. ---Daily was seen in the ED and admitted Thursday night and discharged yesterday for periannal abscess given iv therapy and sent home on augmentin. While in there started wheezing and when he got home was coughing and wheezing non stop no fever this am  Does the patient have any new or worsening symptoms? ---Yes  Will a triage be completed? ---Yes  Related visit to physician within the last 2 weeks? ---No  Does the PT have any chronic conditions? (i.e. diabetes, asthma, etc.) ---No  Is this a behavioral health or substance abuse call? ---No     Guidelines    Guideline Title Affirmed Question Affirmed Notes  Cough - Acute Non-Productive Wheezing is present    Final Disposition User   See Physician within 4 Hours (or PCP triage) Laural Benes, RN, Dondra Spry    Comments  NOTE appt time with Dr. Everlene Other 1115am today   Referrals  REFERRED TO PCP OFFICE   Disagree/Comply: Comply

## 2016-09-20 LAB — CULTURE, BLOOD (ROUTINE X 2)
CULTURE: NO GROWTH
Culture: NO GROWTH

## 2016-09-23 DIAGNOSIS — G4733 Obstructive sleep apnea (adult) (pediatric): Secondary | ICD-10-CM | POA: Diagnosis not present

## 2016-09-30 ENCOUNTER — Telehealth: Payer: Self-pay

## 2016-09-30 ENCOUNTER — Telehealth: Payer: Self-pay | Admitting: Pulmonary Disease

## 2016-09-30 DIAGNOSIS — G4733 Obstructive sleep apnea (adult) (pediatric): Secondary | ICD-10-CM

## 2016-09-30 NOTE — Telephone Encounter (Signed)
-----   Message from Shane Crutch, MD sent at 09/23/2016  4:53 PM EDT ----- Regarding: sleep study recommendations.  Sleep study confirmed sleep apnea.  Recommend that the patient be referred for an in-lab sleep study. If this is not covered by insurance may consider an auto-titrating CPAP with pressure of 5-20 cm H2O.

## 2016-09-30 NOTE — Telephone Encounter (Signed)
Pt aware of results and voiced his understanding. Titration has been ordered. Nothing further needed.

## 2016-09-30 NOTE — Telephone Encounter (Signed)
Pt calling asking about his sleep study test results Please call

## 2016-09-30 NOTE — Telephone Encounter (Signed)
Will close this message, as another message as been created regarding sleep study results. Nothing further needed.

## 2016-10-03 ENCOUNTER — Other Ambulatory Visit: Payer: Self-pay | Admitting: Family Medicine

## 2016-10-03 DIAGNOSIS — K219 Gastro-esophageal reflux disease without esophagitis: Secondary | ICD-10-CM

## 2016-10-06 ENCOUNTER — Ambulatory Visit: Payer: Federal, State, Local not specified - PPO | Attending: Internal Medicine

## 2016-10-06 ENCOUNTER — Encounter: Payer: Self-pay | Admitting: Internal Medicine

## 2016-10-06 DIAGNOSIS — G4733 Obstructive sleep apnea (adult) (pediatric): Secondary | ICD-10-CM | POA: Insufficient documentation

## 2016-10-07 ENCOUNTER — Telehealth: Payer: Self-pay | Admitting: Pulmonary Disease

## 2016-10-07 NOTE — Telephone Encounter (Signed)
Pt informed that the titration study hasn't been read yet since he just done it last night. Informed pt that we would call once provider receives results.

## 2016-10-07 NOTE — Telephone Encounter (Signed)
Patient wants sleep study results °

## 2016-10-07 NOTE — Telephone Encounter (Signed)
LMOM for pt to return call. 

## 2016-10-14 ENCOUNTER — Telehealth: Payer: Self-pay | Admitting: *Deleted

## 2016-10-14 DIAGNOSIS — G4733 Obstructive sleep apnea (adult) (pediatric): Secondary | ICD-10-CM | POA: Diagnosis not present

## 2016-10-14 NOTE — Telephone Encounter (Signed)
-----   Message from Shane Crutch, MD sent at 10/14/2016 11:39 AM EDT ----- Regarding: CPAP titration results.   Auto-CPAP with range of 10-15  cmH2O  .Trial off of afrin nasal spray which may be contributing to sleep frequent awakening seen during sleep study

## 2016-10-14 NOTE — Telephone Encounter (Signed)
Patient returned called and was given results of titration study. Patient also made aware to d/c afrin nasal spray. Patient notified that he needs to call office and schedule f/u within 31-90 day time frame for insurance purposes.

## 2016-11-08 DIAGNOSIS — G4733 Obstructive sleep apnea (adult) (pediatric): Secondary | ICD-10-CM | POA: Diagnosis not present

## 2016-11-08 DIAGNOSIS — I10 Essential (primary) hypertension: Secondary | ICD-10-CM | POA: Diagnosis not present

## 2016-11-23 ENCOUNTER — Other Ambulatory Visit: Payer: Self-pay | Admitting: General Surgery

## 2016-11-23 DIAGNOSIS — K611 Rectal abscess: Secondary | ICD-10-CM

## 2016-12-04 ENCOUNTER — Other Ambulatory Visit: Payer: Self-pay

## 2016-12-09 DIAGNOSIS — G4733 Obstructive sleep apnea (adult) (pediatric): Secondary | ICD-10-CM | POA: Diagnosis not present

## 2016-12-09 DIAGNOSIS — I10 Essential (primary) hypertension: Secondary | ICD-10-CM | POA: Diagnosis not present

## 2016-12-10 ENCOUNTER — Ambulatory Visit
Admission: RE | Admit: 2016-12-10 | Discharge: 2016-12-10 | Disposition: A | Discharge status: Home / Self Care | Payer: Federal, State, Local not specified - PPO | Source: Ambulatory Visit | Attending: General Surgery | Admitting: General Surgery

## 2016-12-10 DIAGNOSIS — K611 Rectal abscess: Secondary | ICD-10-CM

## 2016-12-10 MED ORDER — GADOBENATE DIMEGLUMINE 529 MG/ML IV SOLN
20.0000 mL | Freq: Once | INTRAVENOUS | Status: AC | PRN
  Administered 2016-12-10: 20 mL via INTRAVENOUS

## 2016-12-26 ENCOUNTER — Ambulatory Visit: Payer: Self-pay | Admitting: Pulmonary Disease

## 2017-01-03 ENCOUNTER — Other Ambulatory Visit: Payer: Self-pay | Admitting: General Surgery

## 2017-01-03 DIAGNOSIS — K603 Anal fistula: Secondary | ICD-10-CM | POA: Diagnosis not present

## 2017-01-03 NOTE — H&P (Signed)
History of Present Illness Curtis Harper(Curtis Thum MD; 01/03/2017 12:03 PM) The patient is a 46 year old male who presents with a complaint of anal problems. 46 year old male who presents to the office for evaluation of recurrent perirectal abscesses. This first began approximately 5 years ago. Since that time he has had 4 different episodes, with 1 occurring almost every year. Patient denies any symptoms in between. He reports regular bowel movements and states his colonoscopy was normal as well. He denies any chronic rectal drainage. He underwent an MRI of the pelvis for evaluation of anal fistula. 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 He has had no further symptoms.   Problem List/Past Medical Curtis Harper(Curtis Valenti, MD; 01/03/2017 12:04 PM) Curtis CornfieldPERIRECTAL ABSCESS (K61.1) ANAL FISTULA (K60.3)  Allergies Curtis Harper(Curtis Harper, CMA; 01/03/2017 10:34 AM) LISINOPRIL Cough. Allergies Reconciled  Medication History Curtis Harper(Curtis Harper, CMA; 01/03/2017 10:34 AM) Valsartan-Hydrochlorothiazide (320-25MG  Tablet, Oral) Active. Sildenafil Citrate (100MG  Tablet, Oral) Active. Polyvinyl Alcohol (1.4% Solution, Ophthalmic) Active. Pantoprazole Sodium (40MG  Tablet DR, Oral) Active. Afrin Sinus (0.05% Solution, Nasal) Active. Metoprolol Succinate ER (25MG  Tablet ER 24HR, Oral) Active. Advil PM (200-38MG  Tablet, Oral) Active. Medications Reconciled  Social History Curtis Harper(Curtis Adee, MD; 01/03/2017 12:04 PM) Alcohol use Moderate alcohol use. Caffeine use Coffee. No drug use Tobacco use Former smoker.  Family History Curtis Harper(Curtis Helf, MD; 01/03/2017 12:04 PM) Heart disease in male family member before age 255 Hypertension Mother. Prostate Cancer Family Members In General.  Other Problems Curtis Harper(Curtis Kiger, MD; 01/03/2017 12:04 PM) Back Pain Gastroesophageal Reflux Disease High blood pressure Inguinal  Hernia Migraine Headache Sleep Apnea     Review of Systems Curtis Harper(Curtis Kulakowski MD; 01/03/2017 12:04 PM) General Present- Night Sweats. Not Present- Appetite Loss, Chills, Fatigue, Fever, Weight Gain and Weight Loss. Skin Present- Dryness. Not Present- Change in Wart/Mole, Hives, Jaundice, New Lesions, Non-Healing Wounds, Rash and Ulcer. HEENT Present- Seasonal Allergies, Sinus Pain and Wears glasses/contact lenses. Not Present- Earache, Hearing Loss, Hoarseness, Nose Bleed, Oral Ulcers, Ringing in the Ears, Sore Throat, Visual Disturbances and Yellow Eyes. Respiratory Present- Snoring. Not Present- Bloody sputum, Chronic Cough, Difficulty Breathing and Wheezing. Gastrointestinal Present- Abdominal Pain, Change in Bowel Habits and Constipation. Not Present- Bloating, Bloody Stool, Chronic diarrhea, Difficulty Swallowing, Excessive gas, Gets full quickly at meals, Hemorrhoids, Indigestion, Nausea, Rectal Pain and Vomiting. Male Genitourinary Present- Urine Leakage. Not Present- Blood in Urine, Change in Urinary Stream, Frequency, Impotence, Nocturia, Painful Urination and Urgency.  Vitals Curtis Harper(Curtis Harper CMA; 01/03/2017 10:34 AM) 01/03/2017 10:34 AM Weight: 270 lb Height: 71in Body Surface Area: 2.4 m Body Mass Index: 37.66 kg/m  Temp.: 98.41F  Pulse: 79 (Regular)  P.OX: 98% (Room air) BP: 138/82 (Sitting, Left Arm, Standard)      Physical Exam Curtis Harper(Curtis Thivierge MD; 01/03/2017 12:04 PM)  General Mental Status-Alert. General Appearance-Not in acute distress. Build & Nutrition-Well nourished. Posture-Normal posture. Gait-Normal.  Head and Neck Head-normocephalic, atraumatic with no lesions or palpable masses. Trachea-midline.  Chest and Lung Exam Chest and lung exam reveals -on auscultation, normal breath sounds, no adventitious sounds and normal vocal resonance.  Cardiovascular Cardiovascular examination reveals -normal heart sounds, regular rate and  rhythm with no murmurs and no digital clubbing, cyanosis, edema, increased warmth or tenderness.  Abdomen Inspection Inspection of the abdomen reveals - No Hernias. Palpation/Percussion Palpation and Percussion of the abdomen reveal - Soft, Non Tender, No Rigidity (guarding), No hepatosplenomegaly and No Palpable abdominal masses.  Neurologic Neurologic evaluation  reveals -alert and oriented x 3 with no impairment of recent or remote memory, normal attention span and ability to concentrate, normal sensation and normal coordination.  Musculoskeletal Normal Exam - Bilateral-Upper Extremity Strength Normal and Lower Extremity Strength Normal.    Assessment & Plan Curtis Levee MD; 01/03/2017 12:05 PM)  ANAL FISTULA (K60.3) Impression: 46 year old male with recurrent perirectal abscesses. MRI was performed which showed a left-sided fistula with posterior midline internal opening. It appears to be high transsphincteric. I have recommended a 2 stage operation with seton placement first and then L IFT procedure second. I have reviewed his MRI findings with him and we have discussed the surgical procedure in detail. I believe he understands this and has agreed to proceed with surgery.

## 2017-01-08 DIAGNOSIS — I10 Essential (primary) hypertension: Secondary | ICD-10-CM | POA: Diagnosis not present

## 2017-01-08 DIAGNOSIS — G4733 Obstructive sleep apnea (adult) (pediatric): Secondary | ICD-10-CM | POA: Diagnosis not present

## 2017-01-09 ENCOUNTER — Encounter: Payer: Self-pay | Admitting: Pulmonary Disease

## 2017-01-09 ENCOUNTER — Ambulatory Visit (INDEPENDENT_AMBULATORY_CARE_PROVIDER_SITE_OTHER): Payer: Federal, State, Local not specified - PPO | Admitting: Pulmonary Disease

## 2017-01-09 VITALS — BP 138/90 | HR 77 | Ht 71.0 in | Wt 266.0 lb

## 2017-01-09 DIAGNOSIS — G4733 Obstructive sleep apnea (adult) (pediatric): Secondary | ICD-10-CM

## 2017-01-09 DIAGNOSIS — E668 Other obesity: Secondary | ICD-10-CM

## 2017-01-09 NOTE — Patient Instructions (Addendum)
Continue AutoSet CPAP at current settings (10-15 cm H2O)  Continue work on weight loss  Follow-up with us as needed for any sleep-related or breathing problems

## 2017-01-09 NOTE — Progress Notes (Signed)
SLEEP FOLLOW-UP NOTE  Requesting MD/Service: Self Date of initial consultation: 08/18/16 Reason for consultation: OSA  PT PROFILE: 46 y.o. M with OSA first diagnosed 2007 with poor compliance with CPAP due to difficulty tolerating it  DATA: 09/16/16 Home sleep study: AHI 18/hr. Poor quality sleep due to pain. PLMD 10/14/16 CPAP titration: Rec auto-set 10-15 cm H2O 06/23-07/22/18 Compliance: used 29/30 days. >4 hrs 20/30 days  SUBJ:  Routine re-eval for OSA. Has been started on CPAP since last visit. Reports that it "helps a lot". Reports that he is tolerating well. No new complaints.   Vitals:   01/09/17 1048  BP: 138/90  Pulse: 77  SpO2: 98%  Weight: 266 lb (120.7 kg)  Height: 5\' 11"  (1.803 m)     EXAM:  Gen: NAD HEENT: WNL Neck: no JVD Lungs: Clear Cardiovascular: Reg, no murmurs Abdomen: Soft, nontender, normal BS Ext: without clubbing, cyanosis, edema Neuro: grossly intact  DATA:   BMP Latest Ref Rng & Units 09/16/2016 09/15/2016 05/16/2016  Glucose 65 - 99 mg/dL 161(W113(H) 960(A119(H) -  BUN 6 - 20 mg/dL 8 9 -  Creatinine 5.400.61 - 1.24 mg/dL 9.810.85 1.910.84 -  BUN/Creat Ratio 9 - 20 - - -  Sodium 135 - 145 mmol/L 141 137 -  Potassium 3.5 - 5.1 mmol/L 3.6 3.1(L) 3.5  Chloride 101 - 111 mmol/L 104 97(L) -  CO2 22 - 32 mmol/L 27 27 -  Calcium 8.9 - 10.3 mg/dL 4.7(W8.5(L) 9.4 -    CBC Latest Ref Rng & Units 09/16/2016 09/15/2016 03/23/2016  WBC 4.0 - 10.5 K/uL 8.5 12.9(H) 7.7  Hemoglobin 13.0 - 17.0 g/dL 11.0(L) 13.3 13.6  Hematocrit 39.0 - 52.0 % 33.7(L) 39.4 40.0  Platelets 150 - 400 K/uL 249 275 333.0    CXR:  No new film  IMPRESSION:     ICD-10-CM   1. OSA (obstructive sleep apnea) G47.33   2. Moderate obesity E66.8   3. Chronic rhinitis  PLAN:  Continue AutoSet CPAP at current settings (10-15 cm H2O) Continue work on weight loss Follow-up one year or as needed for any sleep-related or breathing problems    Billy Fischeravid Simonds, MD PCCM service Mobile  [phone] Pager [phone] 01/09/2017 8:55 PM 8:55 PM

## 2017-01-24 ENCOUNTER — Telehealth: Payer: Self-pay | Admitting: Family Medicine

## 2017-01-24 ENCOUNTER — Other Ambulatory Visit: Payer: Self-pay | Admitting: Family Medicine

## 2017-01-24 MED ORDER — SILDENAFIL CITRATE 20 MG PO TABS: 20.0000 mg | ORAL_TABLET | Freq: Every day | ORAL | 0 refills | Status: AC | PRN

## 2017-01-24 NOTE — Telephone Encounter (Signed)
Pt called and wanted to know if Dr. Adriana Simasook could change the dosage on his sildenafil (VIAGRA) 100 MG tablet to 20 mg of the generic and send it to [facility] it would be cheaper for him. Please advise, thank you!  Call pt @ [phone]

## 2017-01-27 ENCOUNTER — Other Ambulatory Visit: Payer: Self-pay | Admitting: Family Medicine

## 2017-01-27 DIAGNOSIS — I10 Essential (primary) hypertension: Secondary | ICD-10-CM

## 2017-02-07 ENCOUNTER — Encounter: Payer: Self-pay | Admitting: Family Medicine

## 2017-02-08 DIAGNOSIS — I10 Essential (primary) hypertension: Secondary | ICD-10-CM | POA: Diagnosis not present

## 2017-02-08 DIAGNOSIS — G4733 Obstructive sleep apnea (adult) (pediatric): Secondary | ICD-10-CM | POA: Diagnosis not present

## 2017-02-09 ENCOUNTER — Encounter (HOSPITAL_BASED_OUTPATIENT_CLINIC_OR_DEPARTMENT_OTHER): Payer: Self-pay | Admitting: *Deleted

## 2017-02-09 NOTE — Progress Notes (Signed)
RETURNED CALL FROM PT. PER PT STATED NEEDS TO RESCHEDULE SURGERY , GAVE PT OR SCHEDULER # AT DR [facility] OFFICE.

## 2017-02-17 ENCOUNTER — Ambulatory Visit (HOSPITAL_BASED_OUTPATIENT_CLINIC_OR_DEPARTMENT_OTHER)
Admission: RE | Admit: 2017-02-17 | Payer: Federal, State, Local not specified - PPO | Source: Ambulatory Visit | Admitting: General Surgery

## 2017-02-17 ENCOUNTER — Encounter (HOSPITAL_BASED_OUTPATIENT_CLINIC_OR_DEPARTMENT_OTHER): Admission: RE | Payer: Self-pay | Source: Ambulatory Visit

## 2017-02-17 HISTORY — DX: Diverticulosis of large intestine without perforation or abscess without bleeding: K57.30

## 2017-02-17 HISTORY — DX: Personal history of other diseases of the digestive system: Z87.19

## 2017-02-17 HISTORY — DX: Unspecified hemorrhoids: K64.9

## 2017-02-17 HISTORY — DX: Anal fistula: K60.3

## 2017-02-17 HISTORY — DX: Personal history of other specified conditions: Z87.898

## 2017-02-17 HISTORY — DX: Obstructive sleep apnea (adult) (pediatric): G47.33

## 2017-02-17 HISTORY — DX: Anal fistula, unspecified: K60.30

## 2017-02-17 SURGERY — EXAM UNDER ANESTHESIA
Anesthesia: Monitor Anesthesia Care

## 2017-02-25 ENCOUNTER — Other Ambulatory Visit: Payer: Self-pay | Admitting: Family Medicine

## 2017-02-25 DIAGNOSIS — I10 Essential (primary) hypertension: Secondary | ICD-10-CM

## 2017-03-11 DIAGNOSIS — G4733 Obstructive sleep apnea (adult) (pediatric): Secondary | ICD-10-CM | POA: Diagnosis not present

## 2017-03-11 DIAGNOSIS — I10 Essential (primary) hypertension: Secondary | ICD-10-CM | POA: Diagnosis not present

## 2017-04-05 ENCOUNTER — Other Ambulatory Visit: Payer: Self-pay | Admitting: Family Medicine

## 2017-04-05 DIAGNOSIS — K219 Gastro-esophageal reflux disease without esophagitis: Secondary | ICD-10-CM

## 2017-04-06 ENCOUNTER — Telehealth: Payer: Self-pay | Admitting: Family Medicine

## 2017-04-06 NOTE — Telephone Encounter (Signed)
pantoprazole (PROTONIX) 40 MG tablet ° °

## 2017-04-10 DIAGNOSIS — G4733 Obstructive sleep apnea (adult) (pediatric): Secondary | ICD-10-CM | POA: Diagnosis not present

## 2017-04-10 DIAGNOSIS — I10 Essential (primary) hypertension: Secondary | ICD-10-CM | POA: Diagnosis not present

## 2017-04-24 ENCOUNTER — Ambulatory Visit: Payer: Self-pay | Admitting: General Surgery

## 2017-04-24 DIAGNOSIS — K603 Anal fistula: Secondary | ICD-10-CM | POA: Diagnosis not present

## 2017-04-24 NOTE — H&P (Signed)
The patient is a 46 year old male who presents with a complaint of anal problems. 46 year old male who presents to the office for evaluation of recurrent perirectal abscesses. This first began approximately 5 years ago. Since that time he has had 4 different episodes, with 1 occurring almost every year. Patient denies any symptoms in between. He reports regular bowel movements and states his colonoscopy was normal as well. He denies any chronic rectal drainage. He underwent an MRI of the pelvis for evaluation of anal fistula. 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 He has had no further symptoms.   Problem List/Past Medical Romie Levee(Amun Stemm, MD; 04/24/2017 10:45 AM) Ocie CornfieldPERIRECTAL ABSCESS (K61.1) ANAL FISTULA (K60.3)  Allergies Judithann Sauger(Patricia King, [place]MA; 04/24/2017 10:37 AM) LISINOPRIL Cough. Allergies Reconciled  Medication History Judithann Sauger(Patricia King, [place]MA; 04/24/2017 10:38 AM) Valsartan-Hydrochlorothiazide (320-25MG  Tablet, Oral) Active. Sildenafil Citrate (100MG  Tablet, Oral) Active. Polyvinyl Alcohol (1.4% Solution, Ophthalmic) Active. Pantoprazole Sodium (40MG  Tablet DR, Oral) Active. Afrin Sinus (0.05% Solution, Nasal) Active. Metoprolol Succinate ER (25MG  Tablet ER 24HR, Oral) Active. Advil PM (200-38MG  Tablet, Oral) Active. Medications Reconciled  Social History Romie Levee(Shahin Knierim, MD; 04/24/2017 10:45 AM) Alcohol use Moderate alcohol use. Caffeine use Coffee. No drug use Tobacco use Former smoker.  Family History Romie Levee(Jamelah Sitzer, MD; 04/24/2017 10:45 AM) Heart disease in male family member before age 48 Hypertension Mother. Prostate Cancer Family Members In General.  Other Problems Romie Levee(Ananda Caya, MD; 04/24/2017 10:45 AM) Back Pain Gastroesophageal Reflux Disease High blood pressure Inguinal Hernia Migraine Headache Sleep Apnea     Review of Systems Romie Levee(Devaney Segers MD; 04/24/2017 10:45 AM) General Present- Night Sweats. Not Present- Appetite Loss, Chills, Fatigue, Fever, Weight Gain and Weight Loss. Skin Present- Dryness. Not Present- Change in Wart/Mole, Hives, Jaundice, New Lesions, Non-Healing Wounds, Rash and Ulcer. HEENT Present- Seasonal Allergies, Sinus Pain and Wears glasses/contact lenses. Not Present- Earache, Hearing Loss, Hoarseness, Nose Bleed, Oral Ulcers, Ringing in the Ears, Sore Throat, Visual Disturbances and Yellow Eyes. Respiratory Present- Snoring. Not Present- Bloody sputum, Chronic Cough, Difficulty Breathing and Wheezing. Gastrointestinal Present- Abdominal Pain, Change in Bowel Habits and Constipation. Not Present- Bloating, Bloody Stool, Chronic diarrhea, Difficulty Swallowing, Excessive gas, Gets full quickly at meals, Hemorrhoids, Indigestion, Nausea, Rectal Pain and Vomiting. Male Genitourinary Present- Urine Leakage. Not Present- Blood in Urine, Change in Urinary Stream, Frequency, Impotence, Nocturia, Painful Urination and Urgency.  Vitals Judithann Sauger(Patricia King RMA; 04/24/2017 10:37 AM) 04/24/2017 10:37 AM Weight: 266.4 lb Height: 71in Body Surface Area: 2.38 m Body Mass Index: 37.15 kg/m  Temp.: 36F  Pulse: 110 (Regular)  BP: 130/72 (Sitting, Left Arm, Standard)      Physical Exam Romie Levee(Kadon Andrus MD; 04/24/2017 10:45 AM)  General Mental Status-Alert. General Appearance-Not in acute distress. Build & Nutrition-Well nourished. Posture-Normal posture. Gait-Normal.  Head and Neck Head-normocephalic, atraumatic with no lesions or palpable masses. Trachea-midline.  Chest and Lung Exam Chest and lung exam reveals -on auscultation, normal breath sounds, no adventitious sounds and normal vocal resonance.  Cardiovascular Cardiovascular examination reveals -normal heart sounds, regular rate and rhythm with no murmurs and no digital clubbing, cyanosis, edema, increased warmth or  tenderness.  Abdomen Inspection Inspection of the abdomen reveals - No Hernias. Palpation/Percussion Palpation and Percussion of the abdomen reveal - Soft, Non Tender, No Rigidity (guarding), No hepatosplenomegaly and No Palpable abdominal masses.  Rectal Anorectal Exam External - normal external exam.  Neurologic Neurologic evaluation reveals -alert and oriented  x 3 with no impairment of recent or remote memory, normal attention span and ability to concentrate, normal sensation and normal coordination.  Musculoskeletal Normal Exam - Bilateral-Upper Extremity Strength Normal and Lower Extremity Strength Normal.    Assessment & Plan Romie Levee MD; 04/24/2017 10:45 AM)  ANAL FISTULA (K60.3) Impression: 46 year old male with recurrent perirectal abscesses. MRI was performed which showed a left-sided fistula with posterior midline internal opening. It appears to be high transsphincteric. I have recommended a 2 stage operation with seton placement first and then L IFT procedure second. I have reviewed his MRI findings with him and we have discussed the surgical procedure in detail. I believe he understands this and has agreed to proceed with surgery.

## 2017-05-11 DIAGNOSIS — I10 Essential (primary) hypertension: Secondary | ICD-10-CM | POA: Diagnosis not present

## 2017-05-11 DIAGNOSIS — G4733 Obstructive sleep apnea (adult) (pediatric): Secondary | ICD-10-CM | POA: Diagnosis not present

## 2017-06-07 ENCOUNTER — Encounter (HOSPITAL_BASED_OUTPATIENT_CLINIC_OR_DEPARTMENT_OTHER): Payer: Self-pay | Admitting: *Deleted

## 2017-06-10 DIAGNOSIS — I10 Essential (primary) hypertension: Secondary | ICD-10-CM | POA: Diagnosis not present

## 2017-06-10 DIAGNOSIS — G4733 Obstructive sleep apnea (adult) (pediatric): Secondary | ICD-10-CM | POA: Diagnosis not present

## 2017-06-14 ENCOUNTER — Encounter (HOSPITAL_BASED_OUTPATIENT_CLINIC_OR_DEPARTMENT_OTHER): Admission: RE | Payer: Self-pay | Source: Ambulatory Visit

## 2017-06-14 ENCOUNTER — Ambulatory Visit (HOSPITAL_BASED_OUTPATIENT_CLINIC_OR_DEPARTMENT_OTHER)
Admission: RE | Admit: 2017-06-14 | Payer: Federal, State, Local not specified - PPO | Source: Ambulatory Visit | Admitting: General Surgery

## 2017-06-14 SURGERY — EXAM UNDER ANESTHESIA, RECTUM
Anesthesia: Choice

## 2017-06-25 ENCOUNTER — Other Ambulatory Visit: Payer: Self-pay | Admitting: Family Medicine

## 2017-06-25 DIAGNOSIS — I10 Essential (primary) hypertension: Secondary | ICD-10-CM

## 2017-07-11 DIAGNOSIS — G4733 Obstructive sleep apnea (adult) (pediatric): Secondary | ICD-10-CM | POA: Diagnosis not present

## 2017-07-11 DIAGNOSIS — I10 Essential (primary) hypertension: Secondary | ICD-10-CM | POA: Diagnosis not present

## 2017-07-12 ENCOUNTER — Ambulatory Visit: Payer: Self-pay | Admitting: Internal Medicine

## 2017-07-17 ENCOUNTER — Telehealth: Payer: Self-pay | Admitting: Family Medicine

## 2017-07-17 ENCOUNTER — Ambulatory Visit (INDEPENDENT_AMBULATORY_CARE_PROVIDER_SITE_OTHER): Payer: Federal, State, Local not specified - PPO | Admitting: Internal Medicine

## 2017-07-17 ENCOUNTER — Encounter: Payer: Self-pay | Admitting: Internal Medicine

## 2017-07-17 VITALS — BP 130/78 | HR 76 | Temp 98.1°F | Resp 16 | Ht 70.75 in | Wt 273.0 lb

## 2017-07-17 DIAGNOSIS — R319 Hematuria, unspecified: Secondary | ICD-10-CM

## 2017-07-17 DIAGNOSIS — E669 Obesity, unspecified: Secondary | ICD-10-CM

## 2017-07-17 DIAGNOSIS — K219 Gastro-esophageal reflux disease without esophagitis: Secondary | ICD-10-CM

## 2017-07-17 DIAGNOSIS — Z125 Encounter for screening for malignant neoplasm of prostate: Secondary | ICD-10-CM

## 2017-07-17 DIAGNOSIS — R7303 Prediabetes: Secondary | ICD-10-CM | POA: Diagnosis not present

## 2017-07-17 DIAGNOSIS — R222 Localized swelling, mass and lump, trunk: Secondary | ICD-10-CM | POA: Insufficient documentation

## 2017-07-17 DIAGNOSIS — R32 Unspecified urinary incontinence: Secondary | ICD-10-CM

## 2017-07-17 DIAGNOSIS — Z8042 Family history of malignant neoplasm of prostate: Secondary | ICD-10-CM | POA: Diagnosis not present

## 2017-07-17 DIAGNOSIS — J309 Allergic rhinitis, unspecified: Secondary | ICD-10-CM | POA: Insufficient documentation

## 2017-07-17 DIAGNOSIS — N529 Male erectile dysfunction, unspecified: Secondary | ICD-10-CM | POA: Diagnosis not present

## 2017-07-17 DIAGNOSIS — R3911 Hesitancy of micturition: Secondary | ICD-10-CM | POA: Diagnosis not present

## 2017-07-17 DIAGNOSIS — Z1159 Encounter for screening for other viral diseases: Secondary | ICD-10-CM | POA: Diagnosis not present

## 2017-07-17 DIAGNOSIS — I10 Essential (primary) hypertension: Secondary | ICD-10-CM | POA: Diagnosis not present

## 2017-07-17 DIAGNOSIS — Z1329 Encounter for screening for other suspected endocrine disorder: Secondary | ICD-10-CM

## 2017-07-17 DIAGNOSIS — R0789 Other chest pain: Secondary | ICD-10-CM

## 2017-07-17 LAB — CBC WITH DIFFERENTIAL/PLATELET
Basophils Absolute: 0 K/uL (ref 0.0–0.1)
Basophils Relative: 0.9 % (ref 0.0–3.0)
Eosinophils Absolute: 0.4 K/uL (ref 0.0–0.7)
Eosinophils Relative: 8 % — ABNORMAL HIGH (ref 0.0–5.0)
HCT: 39.7 % (ref 39.0–52.0)
Hemoglobin: 13.6 g/dL (ref 13.0–17.0)
Lymphocytes Relative: 37.8 % (ref 12.0–46.0)
Lymphs Abs: 2 K/uL (ref 0.7–4.0)
MCHC: 34.3 g/dL (ref 30.0–36.0)
MCV: 86.8 fl (ref 78.0–100.0)
Monocytes Absolute: 0.3 K/uL (ref 0.1–1.0)
Monocytes Relative: 5.9 % (ref 3.0–12.0)
Neutro Abs: 2.5 K/uL (ref 1.4–7.7)
Neutrophils Relative %: 47.4 % (ref 43.0–77.0)
Platelets: 327 K/uL (ref 150.0–400.0)
RBC: 4.57 Mil/uL (ref 4.22–5.81)
RDW: 12.9 % (ref 11.5–15.5)
WBC: 5.3 K/uL (ref 4.0–10.5)

## 2017-07-17 LAB — HEMOGLOBIN A1C: HEMOGLOBIN A1C: 6.5 % (ref 4.6–6.5)

## 2017-07-17 LAB — URINALYSIS, ROUTINE W REFLEX MICROSCOPIC
BILIRUBIN URINE: NEGATIVE
Ketones, ur: NEGATIVE
Leukocytes, UA: NEGATIVE
Nitrite: NEGATIVE
Specific Gravity, Urine: 1.02 (ref 1.000–1.030)
Total Protein, Urine: NEGATIVE
UROBILINOGEN UA: 0.2 (ref 0.0–1.0)
Urine Glucose: NEGATIVE
pH: 5.5 (ref 5.0–8.0)

## 2017-07-17 LAB — LIPID PANEL
Cholesterol: 203 mg/dL — ABNORMAL HIGH (ref 0–200)
HDL: 52 mg/dL (ref >39.00)
LDL Cholesterol: 129 mg/dL — ABNORMAL HIGH (ref 0–99)
NONHDL: 151.03
TRIGLYCERIDES: 112 mg/dL (ref 0.0–149.0)
Total CHOL/HDL Ratio: 4
VLDL: 22.4 mg/dL (ref 0.0–40.0)

## 2017-07-17 LAB — COMPREHENSIVE METABOLIC PANEL WITH GFR
ALT: 16 U/L (ref 0–53)
AST: 13 U/L (ref 0–37)
Albumin: 4.5 g/dL (ref 3.5–5.2)
Alkaline Phosphatase: 51 U/L (ref 39–117)
BUN: 12 mg/dL (ref 6–23)
CO2: 31 meq/L (ref 19–32)
Calcium: 9.9 mg/dL (ref 8.4–10.5)
Chloride: 100 meq/L (ref 96–112)
Creatinine, Ser: 0.87 mg/dL (ref 0.40–1.50)
GFR: 120.13 mL/min
Glucose, Bld: 130 mg/dL — ABNORMAL HIGH (ref 70–99)
Potassium: 4 meq/L (ref 3.5–5.1)
Sodium: 140 meq/L (ref 135–145)
Total Bilirubin: 0.5 mg/dL (ref 0.2–1.2)
Total Protein: 7.7 g/dL (ref 6.0–8.3)

## 2017-07-17 LAB — T4, FREE: FREE T4: 0.92 ng/dL (ref 0.60–1.60)

## 2017-07-17 LAB — TSH: TSH: 0.58 u[IU]/mL (ref 0.35–4.50)

## 2017-07-17 LAB — PSA: PSA: 1.45 ng/mL (ref 0.10–4.00)

## 2017-07-17 MED ORDER — MONTELUKAST SODIUM 10 MG PO TABS: 10.0000 mg | ORAL_TABLET | Freq: Every day | ORAL | 3 refills | Status: DC

## 2017-07-17 MED ORDER — LOSARTAN POTASSIUM-HCTZ 100-25 MG PO TABS: 1.0000 | ORAL_TABLET | Freq: Every day | ORAL | 1 refills | Status: DC

## 2017-07-17 NOTE — Progress Notes (Addendum)
Chief Complaint  Patient presents with  . Annual Exam   Annual physical but multiple medical problems so will not do physical today  1. C/l left upper chest swelling chronic and intermittnent w/o pain today at times painful  2. C/o ED, urine leakage and hesistance. Revatio 20 mg is not helping  3. C/o chronic nasal congestion using Afrin, Flonase. Nasal saline has not helped in the past and not taking antihistamine  4. HTN controlled on Valsartan-hct 320-25 needs to be changed 2/2 recall  5. OSA on cpap  6. C/o gasping for air at times and valve problem f/u cardiology Dr. Okey Dupre 07/26/17  7. He c/o left perirectal abscess recurrent few weeks ago drained green pus and was sore but not currently   Review of Systems  HENT: Positive for congestion.        +hoarseness, changing voice  Cardiovascular:       +SOB  Gastrointestinal: Positive for constipation and diarrhea.       Denies rectal pain   Genitourinary:       +ED, +urinary leakage, hesistancy   Musculoskeletal: Positive for myalgias.  Neurological: Positive for headaches.       +numbness   Past Medical History:  Diagnosis Date  . Anal fistula   . Complication of anesthesia    oxygen sats dropped  . Difficult intubation   . Diverticulosis of colon   . ED (erectile dysfunction)   . GERD (gastroesophageal reflux disease)   . Hemorrhoids   . History of rectal abscess    11-12-2015;  09-16-2016  . History of urinary retention 11/12/2015   large perirectal abscess  . Hyperlipidemia   . Hypertension   . Migraine   . OSA treated with BiPAP    last study done 09-16-2016  . Perirectal abscess    Past Surgical History:  Procedure Laterality Date  . CHOLECYSTECTOMY    . COLONOSCOPY WITH ESOPHAGOGASTRODUODENOSCOPY (EGD)  06/29/2016  . INCISION AND DRAINAGE ABSCESS N/A 11/12/2015   Procedure: INCISION AND DRAINAGE ABSCESS;  Surgeon: Leafy Ro, MD;  Location: [facility] ORS;  Service: General;  Laterality: N/A;  . INCISION AND  DRAINAGE PERIRECTAL ABSCESS N/A 09/16/2016   Procedure: IRRIGATION AND DEBRIDEMENT PERIRECTAL ABSCESS;  Surgeon: Berna Bue, MD;  Location: [facility] OR;  Service: General;  Laterality: N/A;  . INGUINAL HERNIA REPAIR Left 06/09/2016   Procedure: HERNIA REPAIR INGUINAL ADULT;  Surgeon: Henrene Dodge, MD;  Location: [facility] OR[facility];  Service: General;  Laterality: Left;  . LAPAROSCOPY  05/17/2016   Procedure: LAPAROSCOPY DIAGNOSTIC;  Surgeon: Henrene Dodge, MD;  Location: [facility] ORS;  Service: General;;  . LUMBAR SPINE SURGERY  1999   L5  . NECK FUSION  2013  . ROTATOR CUFF REPAIR Left   . TONSILLECTOMY AND ADENOIDECTOMY    . TRANSTHORACIC ECHOCARDIOGRAM  05/30/2016   ef 60-65%/  mild MR and PR/ mild LAE and RAE/ trivial TR   Family History  Problem Relation Age of Onset  . Stomach cancer Father        spread to lungs  . Hypertension Mother   . Hyperlipidemia Mother   . Prostate cancer Maternal Grandfather   . Colon cancer Neg Hx   . Colon polyps Neg Hx   . Esophageal cancer Neg Hx   . Rectal cancer Neg Hx    Social History   Socioeconomic History  . Marital status: Divorced    Spouse name: Not on file  . Number of children: 4  . Years  of education: Not on file  . Highest education level: Not on file  Social Needs  . Financial resource strain: Not on file  . Food insecurity - worry: Not on file  . Food insecurity - inability: Not on file  . Transportation needs - medical: Not on file  . Transportation needs - non-medical: Not on file  Occupational History  . 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 Smoking status: Former Smoker    Packs/day: 0.50    Years: 10.00    Pack years: 5.00    Types: Cigarettes    Last attempt to quit: 05/11/1996    Years since quitting: 21.1  . Smokeless tobacco: Never Used  Substance and Sexual Activity  . Alcohol use: Yes    Alcohol/week: 1.8 oz    Types: 1 Glasses of wine, 2 Cans of beer per week    Comment: occ  . Drug use: No  . Sexual  activity: Yes    Partners: Female  Other Topics Concern  . Not on file  Social History Narrative   Truck driver for [employer]    Current Meds  Medication Sig  . metoprolol succinate (TOPROL-XL) 25 MG 24 hr tablet TAKE 1 TABLET BY MOUTH EVERY DAY  . pantoprazole (PROTONIX) 40 MG tablet TAKE 1 TABLET (40 MG TOTAL) BY MOUTH 2 (TWO) TIMES DAILY BEFORE A MEAL.  . sildenafil (REVATIO) 20 MG tablet Take 1-5 tablets (20-100 mg total) by mouth daily as needed.  . [DISCONTINUED] oxymetazoline (AFRIN) 0.05 % nasal spray Place 1 spray into both nostrils 2 (two) times daily as needed for congestion.  . [DISCONTINUED] valsartan-hydrochlorothiazide (DIOVAN-HCT) 320-25 MG tablet TAKE 1 TABLET BY MOUTH 1 DAY OR 1 DOSE.   Allergies  Allergen Reactions  . Lisinopril Cough   No results found for this or any previous visit (from the past 2160 hour(s)). Objective  Body mass index is 38.35 kg/m. Wt Readings from Last 3 Encounters:  07/17/17 273 lb (123.8 kg)  01/09/17 266 lb (120.7 kg)  09/19/16 276 lb 4 oz (125.3 kg)   Temp Readings from Last 3 Encounters:  07/17/17 98.1 F (36.7 C) (Oral)  09/19/16 98.4 F (36.9 C) (Oral)  09/18/16 97.8 F (36.6 C) (Oral)   BP Readings from Last 3 Encounters:  07/17/17 130/78  01/09/17 138/90  09/19/16 (!) 156/101   Pulse Readings from Last 3 Encounters:  07/17/17 76  01/09/17 77  09/19/16 94   O2 sat room air 95% Physical Exam  Constitutional: He is oriented to person, place, and time and well-developed, well-nourished, and in no distress. Vital signs are normal.  HENT:  Head: Normocephalic and atraumatic.  Mouth/Throat: Oropharynx is clear and moist and mucous membranes are normal.  Eyes: Conjunctivae are normal. Pupils are equal, round, and reactive to light.  Cardiovascular: Normal rate, regular rhythm and normal heart sounds.  Pulmonary/Chest: Effort normal and breath sounds normal. He has no wheezes.  Abdominal: Soft. Bowel sounds are normal.  There is no tenderness.  Neurological: He is alert and oriented to person, place, and time. Gait normal. Gait normal.  Skin: Skin is warm and dry.     Psychiatric: Mood, memory, affect and judgment normal.  Nursing note and vitals reviewed.   Assessment   1. Left upper chest swelling  2. ED revatio did not work, urine leakage and hesistancy persistent hematuria  3. Chronic nasal congestion, allergic rhinitis may be 2/2 medication overuse  4. HTN  5. OSA on cpap  6.  H/o cardiac issues f/u Dr. Okey Dupre 07/26/17  7. Perirectal abscess  8. HM Plan  1.  Will disc imaging with radiology rec MRI with and w/o contrast, CT if unable to do MRI  2.  Refer to urology  Check UA and culture negative   3. Stop afrin may be overuse  flonase max sprays 2 per nose per day  Add singulair  NS did not help  Consider ENT referral in future if not better  4. D/c valsartan-hct 320-25 change to losartan 100-25  5. Cont cpap  6. F/u cards  7. If worsening will refer to surgery for I&D currently not inflammed  8.  Had flu shot  Tdap per pt had 2010 checked [facility] no record of vaccine   Check labs today CMET, cBC, UA, Culture, lipid, TSH, T4, hep B titer, PSA check (FH mGF prostate cancer also prostatitis noted CT ab/pelvis 09/15/16), A1C h/o prediabtes   Colonoscopy had 06/29/16 diverticulosis, internal hemorrhoids  Never smoker/chew   Provider: Dr. French Ana McLean-Scocuzza-Internal Medicine

## 2017-07-17 NOTE — Patient Instructions (Signed)
Labs today  Referred to urology  Please f/u with Dr. Okey Dupre  Stop Afrin try Flonase and Singulair  F/u in 3 months sooner if needed  Try immodium over the counter for diarrhea    Urinary Incontinence Urinary incontinence is the involuntary loss of urine from your bladder. What are the causes? There are many causes of urinary incontinence. They include:  Medicines.  Infections.  Prostatic enlargement, leading to overflow of urine from your bladder.  Surgery.  Neurological diseases.  Emotional factors.  What are the signs or symptoms? Urinary Incontinence can be divided into four types: 1. Urge incontinence. Urge incontinence is the involuntary loss of urine before you have the opportunity to go to the bathroom. There is a sudden urge to void but not enough time to reach a bathroom. 2. Stress incontinence. Stress incontinence is the sudden loss of urine with any activity that forces urine to pass. It is commonly caused by anatomical changes to the pelvis and sphincter areas of your body. 3. Overflow incontinence. Overflow incontinence is the loss of urine from an obstructed opening to your bladder. This results in a backup of urine and a resultant buildup of pressure within the bladder. When the pressure within the bladder exceeds the closing pressure of the sphincter, the urine overflows, which causes incontinence, similar to water overflowing a dam. 4. Total incontinence. Total incontinence is the loss of urine as a result of the inability to store urine within your bladder.  How is this diagnosed? Evaluating the cause of incontinence may require:  A thorough and complete medical and obstetric history.  A complete physical exam.  Laboratory tests such as a urine culture and sensitivities.  When additional tests are indicated, they can include:  An ultrasound exam.  Kidney and bladder X-rays.  Cystoscopy. This is an exam of the bladder using a narrow scope.  Urodynamic  testing to test the nerve function to the bladder and sphincter areas.  How is this treated? Treatment for urinary incontinence depends on the cause:  For urge incontinence caused by a bacterial infection, antibiotics will be prescribed. If the urge incontinence is related to medicines you take, your health care provider may have you change the medicine.  For stress incontinence, surgery to re-establish anatomical support to the bladder or sphincter, or both, will often correct the condition.  For overflow incontinence caused by an enlarged prostate, an operation to open the channel through the enlarged prostate will allow the flow of urine out of the bladder. In women with fibroids, a hysterectomy may be recommended.  For total incontinence, surgery on your urinary sphincter may help. An artificial urinary sphincter (an inflatable cuff placed around the urethra) may be required. In women who have developed a hole-like passage between their bladder and vagina (vesicovaginal fistula), surgery to close the fistula often is required.  Follow these instructions at home:  Normal daily hygiene and the use of pads or adult diapers that are changed regularly will help prevent odors and skin damage.  Avoid caffeine. It can overstimulate your bladder.  Use the bathroom regularly. Try about every 2-3 hours to go to the bathroom, even if you do not feel the need to do so. Take time to empty your bladder completely. After urinating, wait a minute. Then try to urinate again.  For causes involving nerve dysfunction, keep a log of the medicines you take and a journal of the times you go to the bathroom. Contact a health care provider if:  You experience worsening of pain instead of improvement in pain after your procedure.  Your incontinence becomes worse instead of better. Get help right away if:  You experience fever or shaking chills.  You are unable to pass your urine.  You have redness  spreading into your groin or down into your thighs. This information is not intended to replace advice given to you by your health care provider. Make sure you discuss any questions you have with your health care provider. Document Released: 07/14/2004 Document Revised: 01/15/2016 Document Reviewed: 11/13/2012 Elsevier Interactive Patient Education  2018 [employer]. Nonallergic Rhinitis Nonallergic rhinitis is a condition that causes symptoms that affect the nose, such as a runny nose and a stuffed-up nose (nasal congestion) that can make it hard to breathe through the nose. This condition is different from having an allergy (allergic rhinitis). Allergic rhinitis occurs when the body's defense system (immune system) reacts to a substance that you are allergic to (allergen), such as pollen, pet dander, mold, or dust. Nonallergic rhinitis has many similar symptoms, but it is not caused by allergens. Nonallergic rhinitis can be a short-term or long-term problem. What are the causes? This condition can be caused by many different things. Some common types of nonallergic rhinitis include: Infectious rhinitis  This is usually due to an infection in the upper respiratory tract. Vasomotor rhinitis  This is the most common type of long-term nonallergic rhinitis.  It is caused by too much blood flow through the nose, which makes the tissue inside of the nose swell.  Symptoms are often triggered by strong odors, cold air, stress, drinking alcohol, cigarette smoke, or changes in the weather. Occupational rhinitis  This type is caused by triggers in the workplace, such as chemicals, dusts, animal dander, or air pollution. Hormonal rhinitis  This type occurs in women as a result of an increase in the male hormone estrogen.  It may occur during pregnancy, puberty, and menstrual cycles.  Symptoms improve when estrogen levels drop. Drug-induced rhinitis Several drugs can cause nonallergic rhinitis,  including:  Medicines that are used to treat high blood pressure, heart disease, and Parkinson disease.  Aspirin and NSAIDs.  Over-the-counter nasal decongestant sprays. These can cause a type of nonallergic rhinitis (rhinitis medicamentosa) when they are used for more than a few days.  Nonallergic rhinitis with eosinophilia syndrome (NARES)  This type is caused by having too much of a certain type of white blood cell (eosinophil). Nonallergic rhinitis can also be caused by a reaction to eating hot or spicy foods. This does not usually cause long-term symptoms. In some cases, the cause of nonallergic rhinitis is not known. What increases the risk? You are more likely to develop this condition if:  You are 36-28 years of age.  You are a woman. Women are twice as likely to have this condition.  What are the signs or symptoms? Common symptoms of this condition include:  Nasal congestion.  Runny nose.  The feeling of mucus going down the back of the throat (postnasal drip).  Trouble sleeping at night and daytime sleepiness.  Less common symptoms include:  Sneezing.  Coughing.  Itchy nose.  Bloodshot eyes.  How is this diagnosed? This condition may be diagnosed based on:  Your symptoms and medical history.  A physical exam.  Allergy testing to rule out allergic rhinitis. You may have skin tests or blood tests.  In some cases, the health care provider may take a swab of nasal secretions to look for  an increased number of eosinophils. This would be done to confirm a diagnosis of NARES. How is this treated? Treatment for this condition depends on the cause. No single treatment works for everyone. Work with your health care provider to find the best treatment for you. Treatment may include:  Avoiding the things that trigger your symptoms.  Using medicines to relieve congestion, such as: ? Steroid nasal spray. There are many types. You may need to try a few to find out  which one works best. ? Decongestant medicine. This may be an oral medicine or a nasal spray. These medicines are only used for a short time.  Using medicines to relieve a runny nose. These may include antihistamine medicines or anticholinergic nasal sprays.  Surgery to remove tissue from inside the nose may be needed in severe cases if the condition has not improved after 6-12 months of medical treatment. Follow these instructions at home:  Take or use over-the-counter and prescription medicines only as told by your health care provider. Do not stop using your medicine even if you start to feel better.  Use salt-water (saline) rinses or other solutions (nasal washes or irrigations) to wash or rinse out the inside of your nose as told by your health care provider.  Do not take NSAIDs or medicines that contain aspirin if they make your symptoms worse.  Do not drink alcohol if it makes your symptoms worse.  Do not use any tobacco products, such as cigarettes, chewing tobacco, and e-cigarettes. If you need help quitting, ask your health care provider.  Avoid secondhand smoke.  Get some exercise every day. Exercise may help reduce symptoms of nonallergic rhinitis for some people. Ask your health care provider how much exercise and what types of exercise are safe for you.  Sleep with the head of your bed raised (elevated). This may reduce nighttime nasal congestion.  Keep all follow-up visits as told by your health care provider. This is important. Contact a health care provider if:  You have a fever.  Your symptoms are getting worse at home.  Your symptoms are not responding to medicine.  You develop new symptoms, especially a headache or nosebleed. This information is not intended to replace advice given to you by your health care provider. Make sure you discuss any questions you have with your health care provider. Document Released: 09/28/2015 Document Revised: 11/12/2015 Document  Reviewed: 08/27/2015 Elsevier Interactive Patient Education  2018 [employer]

## 2017-07-18 LAB — HEPATITIS B SURFACE ANTIGEN: HEP B S AG: NONREACTIVE

## 2017-07-18 LAB — URINE CULTURE
MICRO NUMBER:: 90115782
RESULT: NO GROWTH
SPECIMEN QUALITY:: ADEQUATE

## 2017-07-18 LAB — HEPATITIS B SURFACE ANTIBODY, QUANTITATIVE: HEPATITIS B-POST: 42 m[IU]/mL (ref >=10)

## 2017-07-21 ENCOUNTER — Ambulatory Visit
Admission: RE | Admit: 2017-07-21 | Discharge: 2017-07-21 | Disposition: A | Discharge status: Home / Self Care | Payer: Federal, State, Local not specified - PPO | Source: Ambulatory Visit | Attending: Internal Medicine | Admitting: Internal Medicine

## 2017-07-21 DIAGNOSIS — R0789 Other chest pain: Secondary | ICD-10-CM | POA: Diagnosis not present

## 2017-07-21 DIAGNOSIS — R222 Localized swelling, mass and lump, trunk: Secondary | ICD-10-CM | POA: Diagnosis not present

## 2017-07-21 MED ORDER — GADOBENATE DIMEGLUMINE 529 MG/ML IV SOLN
20.0000 mL | Freq: Once | INTRAVENOUS | Status: AC | PRN
  Administered 2017-07-21: 20 mL via INTRAVENOUS

## 2017-07-23 ENCOUNTER — Other Ambulatory Visit: Payer: Self-pay | Admitting: Family Medicine

## 2017-07-23 DIAGNOSIS — K219 Gastro-esophageal reflux disease without esophagitis: Secondary | ICD-10-CM

## 2017-07-24 ENCOUNTER — Encounter: Payer: Self-pay | Admitting: Family Medicine

## 2017-07-25 ENCOUNTER — Ambulatory Visit: Payer: Federal, State, Local not specified - PPO

## 2017-07-25 ENCOUNTER — Telehealth: Payer: Self-pay | Admitting: Internal Medicine

## 2017-07-25 NOTE — Telephone Encounter (Signed)
Patient called in asking about the Lipitor and Metformin noted by Dr. Judie GrieveMcLean-Scocuzza 07/24/17. [facility]acy[facility] [phone]

## 2017-07-25 NOTE — Telephone Encounter (Signed)
Copied from CRM [phone]. Topic: Quick Communication - See Telephone Encounter >> Jul 25, 2017  4:28 PM Cipriano BunkerLambe, Annette S wrote: CRM for notification. See Telephone encounter for:   He said he was told yesterday that there was to be a prescriptions called in for Lipitor and one for diabetes Checking to see[facility]  [id number] [place] [place] - [address] [place] [address] Phone: [phone] Fax: [phone]   07/25/17.

## 2017-07-26 ENCOUNTER — Other Ambulatory Visit: Payer: Self-pay | Admitting: Internal Medicine

## 2017-07-26 ENCOUNTER — Ambulatory Visit: Payer: Self-pay | Admitting: Physician Assistant

## 2017-07-26 DIAGNOSIS — K219 Gastro-esophageal reflux disease without esophagitis: Secondary | ICD-10-CM

## 2017-07-26 MED ORDER — ATORVASTATIN CALCIUM 20 MG PO TABS: 20.0000 mg | ORAL_TABLET | Freq: Every day | ORAL | 1 refills | Status: DC

## 2017-07-26 MED ORDER — METFORMIN HCL 500 MG PO TABS: 500.0000 mg | ORAL_TABLET | Freq: Two times a day (BID) | ORAL | 3 refills | Status: AC

## 2017-07-26 MED ORDER — PANTOPRAZOLE SODIUM 40 MG PO TBEC: 40.0000 mg | DELAYED_RELEASE_TABLET | ORAL | 1 refills | Status: DC

## 2017-07-26 NOTE — Telephone Encounter (Signed)
Please advise 

## 2017-07-26 NOTE — Telephone Encounter (Signed)
Sent   TMS 

## 2017-07-27 NOTE — Telephone Encounter (Signed)
The insurance company is sending a request to the practice for the pantoprazole (PROTONIX) 40 MG tablet  Medication for prior authorization because the pharmacy cannot refill the prescription without it.

## 2017-07-27 NOTE — Telephone Encounter (Signed)
Please advise 

## 2017-07-31 ENCOUNTER — Ambulatory Visit: Payer: Federal, State, Local not specified - PPO

## 2017-08-02 ENCOUNTER — Telehealth: Payer: Self-pay | Admitting: Internal Medicine

## 2017-08-02 NOTE — Telephone Encounter (Signed)
Please advise 

## 2017-08-02 NOTE — Telephone Encounter (Signed)
Pt says that he was prescribed and started metFORMIN (GLUCOPHAGE) 500 MG tablet . Pt says that medication is causing him to have diarrhea and nausea. Pt would like to discontinue medication.    Please advise.   CB: [phone]

## 2017-08-03 NOTE — Telephone Encounter (Signed)
1x per day ok with food   Thanks TMS

## 2017-08-03 NOTE — Telephone Encounter (Signed)
Ok to discontinue but will need healthy diet choices and exercise   Thanks TMS

## 2017-08-03 NOTE — Telephone Encounter (Signed)
Patient would like to cut the dosage down.  He took only once in the morning and he was a lot better. He is wondering if he can take it only once in the morning, will that be ok?

## 2017-08-04 ENCOUNTER — Ambulatory Visit: Payer: Federal, State, Local not specified - PPO

## 2017-08-04 NOTE — Telephone Encounter (Signed)
Patient has been informed.

## 2017-08-07 ENCOUNTER — Telehealth: Payer: Self-pay | Admitting: Internal Medicine

## 2017-08-07 NOTE — Telephone Encounter (Unsigned)
Copied from CRM [id number]. Topic: Quick Communication - See Telephone Encounter >> Aug 07, 2017 10:48 AM Floria RavelingStovall, Shana A wrote: CRM for notification. See Telephone encounter for:  pt called in for the status of the PA for pantoprazole (PROTONIX) 40 MG tablet [[id number]][[id number]] .  Have you seen a PA on this med    08/07/17.

## 2017-08-09 NOTE — Progress Notes (Deleted)
Cardiology Office Note Date:  08/09/2017  Patient ID:  Curtis Harper, DOB 01-25-1971, MRN [id number] PCP:  McLean-Scocuzza, Pasty Spillers, MD  Cardiologist:  Dr. Okey Dupre, MD  ***refresh   Chief Complaint: Follow up  History of Present Illness: Curtis Harper is a 47 y.o. male with history of mitral regurgitation, diabetes, HTN, HLD, GERD, sleep apnea on CPAP, obesity, ED, and history of perirectal abscess who presents for follow up.   Patient was noted to have intra-operative hypoxia during a planned left inguinal hernia repair on 05/17/2016 with insufflation of the peritoneum. The procedure was aborted. He was seen in our office on 05/24/2016 by Dr. Okey Dupre and was feeling well. He had undergone a TTE with bubble study on 05/30/16 that showed normal biventrciular function with mild mitral regurgitation. There was no evidence of intracardiac or pulmonary right to left shunt. He was evaluated by pulmonology as well with PFTs being unrevealing. He ultimately underwent open left inguinal hernia repair on 06/09/2016 without respiratory or anesthesia complications. He was most recently seen by Dr. Okey Dupre on 07/05/2016 for routine follow up and was feeling well.   He was seen by his PCP in late 06/2017 with multiple complaints including left upper chest swelling and intermittent pain. He underwent MRI of the chest that was unrevealing. Labs from 06/2017 show A1c 6.5, LDL 129, TSH normal, unremarkable CBC, Na 140, K+ 4.0, glucose 130, SCr 0.87.   ***   Past Medical History:  Diagnosis Date  . Anal fistula   . Complication of anesthesia    oxygen sats dropped  . Difficult intubation   . Diverticulosis of colon   . ED (erectile dysfunction)   . GERD (gastroesophageal reflux disease)   . Hemorrhoids   . History of rectal abscess    11-12-2015;  09-16-2016  . History of urinary retention 11/12/2015   large perirectal abscess  . Hyperlipidemia   . Hypertension   . Migraine   . OSA treated with BiPAP    last study done 09-16-2016  . Perirectal abscess     Past Surgical History:  Procedure Laterality Date  . CHOLECYSTECTOMY    . COLONOSCOPY WITH ESOPHAGOGASTRODUODENOSCOPY (EGD)  06/29/2016  . INCISION AND DRAINAGE ABSCESS N/A 11/12/2015   Procedure: INCISION AND DRAINAGE ABSCESS;  Surgeon: Leafy Ro, MD;  Location: [facility];  Service: General;  Laterality: N/A;  . INCISION AND DRAINAGE PERIRECTAL ABSCESS N/A 09/16/2016   Procedure: IRRIGATION AND DEBRIDEMENT PERIRECTAL ABSCESS;  Surgeon: Berna Bue, MD;  Location: [facility];  Service: General;  Laterality: N/A;  . INGUINAL HERNIA REPAIR Left 06/09/2016   Procedure: HERNIA REPAIR INGUINAL ADULT;  Surgeon: Henrene Dodge, MD;  Location: [facility];  Service: General;  Laterality: Left;  . LAPAROSCOPY  05/17/2016   Procedure: LAPAROSCOPY DIAGNOSTIC;  Surgeon: Henrene Dodge, MD;  Location: [facility];  Service: General;;  . LUMBAR SPINE SURGERY  1999   L5  . NECK FUSION  2013  . ROTATOR CUFF REPAIR Left   . TONSILLECTOMY AND ADENOIDECTOMY    . TRANSTHORACIC ECHOCARDIOGRAM  05/30/2016   ef 60-65%/  mild MR and PR/ mild LAE and RAE/ trivial TR    No outpatient medications have been marked as taking for the 08/14/17 encounter (Appointment) with Sondra Barges, PA-C.    Allergies:   Lisinopril   Social History:  The patient  reports that he quit smoking about 21 years ago. His smoking use included cigarettes. He has a 5.00 pack-year smoking history. he  has never used smokeless tobacco. He reports that he drinks about 1.8 oz of alcohol per week. He reports that he does not use drugs.   Family History:  The patient's family history includes Hyperlipidemia in his mother; Hypertension in his mother; Prostate cancer in his maternal grandfather; Stomach cancer in his father.  ROS:   ROS   PHYSICAL EXAM: *** VS:  There were no vitals taken for this visit. BMI: There is no height or weight on file to calculate BMI.  Physical Exam   EKG:  Was  ordered and interpreted by me today. Shows ***  Recent Labs: 07/17/2017: ALT 16; BUN 12; Creatinine, Ser 0.87; Hemoglobin 13.6; Platelets 327.0; Potassium 4.0; Sodium 140; TSH 0.58  07/17/2017: Cholesterol 203; HDL 52.00; LDL Cholesterol 129; Total CHOL/HDL Ratio 4; Triglycerides 112.0; VLDL 22.4   CrCl cannot be calculated (Patient's most recent lab result is older than the maximum 21 days allowed.).   Wt Readings from Last 3 Encounters:  07/17/17 273 lb (123.8 kg)  01/09/17 266 lb (120.7 kg)  09/19/16 276 lb 4 oz (125.3 kg)     Other studies reviewed: Additional studies/records reviewed today include: summarized above  ASSESSMENT AND PLAN:  1. ***  Disposition: F/u with *** in   Current medicines are reviewed at length with the patient today.  The patient did not have any concerns regarding medicines.  Signed, Eula Listenyan Brieann Osinski, PA-C 08/09/2017 12:02 PM     [facility] [address] [place], [place] [address] [phone]

## 2017-08-11 DIAGNOSIS — G4733 Obstructive sleep apnea (adult) (pediatric): Secondary | ICD-10-CM | POA: Diagnosis not present

## 2017-08-11 DIAGNOSIS — I10 Essential (primary) hypertension: Secondary | ICD-10-CM | POA: Diagnosis not present

## 2017-08-14 ENCOUNTER — Ambulatory Visit: Payer: Self-pay | Admitting: Physician Assistant

## 2017-08-14 ENCOUNTER — Ambulatory Visit: Payer: Federal, State, Local not specified - PPO | Admitting: Urology

## 2017-08-28 ENCOUNTER — Ambulatory Visit: Payer: Federal, State, Local not specified - PPO | Admitting: Nurse Practitioner

## 2017-09-08 DIAGNOSIS — G4733 Obstructive sleep apnea (adult) (pediatric): Secondary | ICD-10-CM | POA: Diagnosis not present

## 2017-09-08 DIAGNOSIS — I10 Essential (primary) hypertension: Secondary | ICD-10-CM | POA: Diagnosis not present

## 2017-09-09 ENCOUNTER — Other Ambulatory Visit: Payer: Self-pay | Admitting: Family Medicine

## 2017-09-09 DIAGNOSIS — I10 Essential (primary) hypertension: Secondary | ICD-10-CM

## 2017-09-17 ENCOUNTER — Emergency Department (HOSPITAL_COMMUNITY)
Admission: EM | Admit: 2017-09-17 | Discharge: 2017-09-17 | Disposition: A | Discharge status: Home / Self Care | Payer: Federal, State, Local not specified - PPO | Attending: Emergency Medicine | Admitting: Emergency Medicine

## 2017-09-17 ENCOUNTER — Encounter (HOSPITAL_COMMUNITY): Payer: Self-pay | Admitting: Emergency Medicine

## 2017-09-17 DIAGNOSIS — Z9049 Acquired absence of other specified parts of digestive tract: Secondary | ICD-10-CM | POA: Insufficient documentation

## 2017-09-17 DIAGNOSIS — I10 Essential (primary) hypertension: Secondary | ICD-10-CM | POA: Insufficient documentation

## 2017-09-17 DIAGNOSIS — R222 Localized swelling, mass and lump, trunk: Secondary | ICD-10-CM | POA: Diagnosis not present

## 2017-09-17 DIAGNOSIS — Z79899 Other long term (current) drug therapy: Secondary | ICD-10-CM | POA: Insufficient documentation

## 2017-09-17 DIAGNOSIS — Z87891 Personal history of nicotine dependence: Secondary | ICD-10-CM | POA: Diagnosis not present

## 2017-09-17 NOTE — ED Notes (Signed)
Pt stable and ambulatory for discharge, states understanding follow up. Pt refused discharge vital signs

## 2017-09-17 NOTE — ED Triage Notes (Signed)
Pt to ED c/o L shoulder pain and swelling intermittent since November. Patient has already had an MRI that showed "soft tissue swelling." Pt reports area is nontender, but he has full ROM, CSM intact.

## 2017-09-17 NOTE — ED Provider Notes (Signed)
[facility] Provider Note   CSN: [id number] Arrival date & time: 09/17/17  1805     History   Chief Complaint Chief Complaint  Patient presents with  . Shoulder Pain    HPI Curtis Harper is a 47 y.o. male.  HPI   Curtis Harper is a 47 y.o. male, with a history of GERD and HTN, presenting to the ED with left chest swelling and tenderness since November 2018. States he has been evaluated by his PCP on the matter. MRI was performed of left upper chest on 07/21/17, which showed some prominence to the left pectoralis muscles with no adenopathy or other abnormalities.  Patient states the area aches all the time, 7/10, radiating into left axilla, however, the swelling waxes and wanes.  Denies left arm swelling, neck swelling, shortness of breath, trauma, numbness, weakness, N/V, fever, rashes/lesions, or any other complaints.    Past Medical History:  Diagnosis Date  . Anal fistula   . Complication of anesthesia    oxygen sats dropped  . Difficult intubation   . Diverticulosis of colon   . ED (erectile dysfunction)   . GERD (gastroesophageal reflux disease)   . Hemorrhoids   . History of rectal abscess    11-12-2015;  09-16-2016  . History of urinary retention 11/12/2015   large perirectal abscess  . Hyperlipidemia   . Hypertension   . Migraine   . OSA treated with BiPAP    last study done 09-16-2016  . Perirectal abscess     Patient Active Problem List   Diagnosis Date Noted  . Prediabetes 07/17/2017  . Family history of prostate cancer 07/17/2017  . Allergic rhinitis 07/17/2017  . Soft tissue swelling of chest wall 07/17/2017  . Respiratory infection 09/19/2016  . Non-rheumatic mitral regurgitation 07/05/2016  . S/P left inguinal hernia repair 06/23/2016  . Essential hypertension 03/24/2016  . Erectile dysfunction 03/24/2016  . Perirectal abscess 11/12/2015    Past Surgical History:  Procedure Laterality Date  .  CHOLECYSTECTOMY    . COLONOSCOPY WITH ESOPHAGOGASTRODUODENOSCOPY (EGD)  06/29/2016  . INCISION AND DRAINAGE ABSCESS N/A 11/12/2015   Procedure: INCISION AND DRAINAGE ABSCESS;  Surgeon: Leafy Roiego F Pabon, MD;  Location: [facility] ORS;  Service: General;  Laterality: N/A;  . INCISION AND DRAINAGE PERIRECTAL ABSCESS N/A 09/16/2016   Procedure: IRRIGATION AND DEBRIDEMENT PERIRECTAL ABSCESS;  Surgeon: Berna Buehelsea A Connor, MD;  Location: [facility] OR;  Service: General;  Laterality: N/A;  . INGUINAL HERNIA REPAIR Left 06/09/2016   Procedure: HERNIA REPAIR INGUINAL ADULT;  Surgeon: Henrene DodgeJose Piscoya, MD;  Location: [facility] ORS;  Service: General;  Laterality: Left;  . LAPAROSCOPY  05/17/2016   Procedure: LAPAROSCOPY DIAGNOSTIC;  Surgeon: Henrene DodgeJose Piscoya, MD;  Location: [facility] ORS;  Service: General;;  . LUMBAR SPINE SURGERY  1999   L5  . NECK FUSION  2013  . ROTATOR CUFF REPAIR Left   . TONSILLECTOMY AND ADENOIDECTOMY    . TRANSTHORACIC ECHOCARDIOGRAM  05/30/2016   ef 60-65%/  mild MR and PR/ mild LAE and RAE/ trivial TR        Home Medications    Prior to Admission medications   Medication Sig Start Date End Date Taking? Authorizing Provider  atorvastatin (LIPITOR) 20 MG tablet Take 1 tablet (20 mg total) by mouth daily at 6 PM. 07/26/17   McLean-Scocuzza, Pasty Spillersracy N, MD  losartan-hydrochlorothiazide (HYZAAR) 100-25 MG tablet Take 1 tablet by mouth daily. 07/17/17   McLean-Scocuzza, Pasty Spillersracy N, MD  metFORMIN (GLUCOPHAGE)  500 MG tablet Take 1 tablet (500 mg total) by mouth 2 (two) times daily with a meal. 07/26/17   McLean-Scocuzza, Pasty Spillers, MD  metoprolol succinate (TOPROL-XL) 25 MG 24 hr tablet TAKE 1 TABLET BY MOUTH EVERY DAY 02/27/17   Everlene Other G, DO  metoprolol succinate (TOPROL-XL) 25 MG 24 hr tablet TAKE 1 TABLET BY MOUTH EVERY DAY 09/15/17   McLean-Scocuzza, Pasty Spillers, MD  montelukast (SINGULAIR) 10 MG tablet Take 1 tablet (10 mg total) by mouth at bedtime. 07/17/17   McLean-Scocuzza, Pasty Spillers, MD  pantoprazole (PROTONIX) 40 MG  tablet Take 1 tablet (40 mg total) by mouth every morning. 30 minutes before food 07/26/17   McLean-Scocuzza, Pasty Spillers, MD  sildenafil (REVATIO) 20 MG tablet Take 1-5 tablets (20-100 mg total) by mouth daily as needed. 01/24/17   Tommie Sams, DO    Family History Family History  Problem Relation Age of Onset  . Stomach cancer Father        spread to lungs  . Hypertension Mother   . Hyperlipidemia Mother   . Prostate cancer Maternal Grandfather   . Colon cancer Neg Hx   . Colon polyps Neg Hx   . Esophageal cancer Neg Hx   . Rectal cancer Neg Hx     Social History Social History   Tobacco Use  . Smoking status: Former Smoker    Packs/day: 0.50    Years: 10.00    Pack years: 5.00    Types: Cigarettes    Last attempt to quit: 05/11/1996    Years since quitting: 21.3  . Smokeless tobacco: Never Used  Substance Use Topics  . Alcohol use: Yes    Alcohol/week: 1.8 oz    Types: 1 Glasses of wine, 2 Cans of beer per week    Comment: occ  . Drug use: No     Allergies   Lisinopril   Review of Systems Review of Systems  Constitutional: Negative for chills, diaphoresis and fever.  Respiratory: Negative for shortness of breath.   Cardiovascular: Negative for palpitations and leg swelling.  Gastrointestinal: Negative for nausea and vomiting.  Musculoskeletal: Negative for neck pain.       Left chest swelling and tenderness  Skin: Negative for color change and rash.  Neurological: Negative for weakness and numbness.     Physical Exam Updated Vital Signs BP 119/74 (BP Location: Right Arm)   Pulse 82   Temp 98.1 F (36.7 C) (Oral)   Resp 20   SpO2 100%   Physical Exam  Constitutional: He appears well-developed and well-nourished. No distress.  HENT:  Head: Normocephalic and atraumatic.  Eyes: Conjunctivae are normal.  Neck: Normal range of motion. Neck supple.  No noted swelling into patient's neck.  Cardiovascular: Normal rate, regular rhythm, normal heart sounds  and intact distal pulses.  Pulmonary/Chest: Effort normal and breath sounds normal. No respiratory distress.  No increased work of breathing. Speaks in full sentences without difficulty. Equal chest rise and fall.  Abdominal: Soft. There is no tenderness. There is no guarding.  Musculoskeletal: He exhibits no edema.  Some tenderness and swelling to left pectoralis region, extending into left axilla.  No noted fluctuance, erythema, crepitus, or instability.  Full passive and active ROM in left shoulder, elbow, and wrist.   Lymphadenopathy:    He has no cervical adenopathy.       Left axillary: No pectoral and no lateral adenopathy present.      Left: No supraclavicular and no epitrochlear  adenopathy present.  Neurological: He is alert.  No sensory deficits noted. Abduction and adduction of the fingers intact against resistance. Grip strength equal bilaterally. Strength 5/5 through the cardinal directions of the bilateral wrists. Strength 5/5 with flexion and extension of the bilateral elbows. Patient can touch the thumb to each one of the fingertips without difficulty.   Skin: Skin is warm and dry. Capillary refill takes less than 2 seconds. He is not diaphoretic. No erythema.  No skin lesions or wounds to left chest or axilla.  Psychiatric: He has a normal mood and affect. His behavior is normal.  Nursing note and vitals reviewed.    ED Treatments / Results  Labs (all labs ordered are listed, but only abnormal results are displayed) Labs Reviewed - No data to display  EKG None  Radiology No results found.  Procedures Procedures (including critical care time)  Medications Ordered in ED Medications - No data to display   Initial Impression / Assessment and Plan / ED Course  I have reviewed the triage vital signs and the nursing notes.  Pertinent labs & imaging results that were available during my care of the patient were reviewed by me and considered in my medical  decision making (see chart for details).     Patient presents with several months of left chest swelling. Patient is nontoxic appearing, afebrile, not tachycardic, not tachypneic, not hypotensive, SPO2 of 100% on room air, and is in no apparent distress. Recent MRI shows soft tissue swelling without other abnormality. No indication for further workup in the emergency department.  Patient should follow-up with PCP and orthopedics. The patient was given instructions for follow up as well as return precautions. Patient voices understanding of these instructions, accepts the plan, and is comfortable with discharge.    Final Clinical Impressions(s) / ED Diagnoses   Final diagnoses:  Chest swelling    ED Discharge Orders    None       Concepcion Living 09/17/17 2113    Rolland Porter, MD 09/18/17 [phone]

## 2017-09-17 NOTE — Discharge Instructions (Addendum)
Follow-up with your primary care provider and orthopedic specialist on this matter as soon as possible.

## 2017-09-21 DIAGNOSIS — I10 Essential (primary) hypertension: Secondary | ICD-10-CM | POA: Diagnosis not present

## 2017-09-21 DIAGNOSIS — E119 Type 2 diabetes mellitus without complications: Secondary | ICD-10-CM | POA: Diagnosis not present

## 2017-09-21 DIAGNOSIS — K219 Gastro-esophageal reflux disease without esophagitis: Secondary | ICD-10-CM | POA: Diagnosis not present

## 2017-09-21 DIAGNOSIS — E7800 Pure hypercholesterolemia, unspecified: Secondary | ICD-10-CM | POA: Diagnosis not present

## 2017-09-25 DIAGNOSIS — K219 Gastro-esophageal reflux disease without esophagitis: Secondary | ICD-10-CM | POA: Diagnosis not present

## 2017-09-25 DIAGNOSIS — I10 Essential (primary) hypertension: Secondary | ICD-10-CM | POA: Diagnosis not present

## 2017-09-25 DIAGNOSIS — E119 Type 2 diabetes mellitus without complications: Secondary | ICD-10-CM | POA: Diagnosis not present

## 2017-09-25 DIAGNOSIS — E7800 Pure hypercholesterolemia, unspecified: Secondary | ICD-10-CM | POA: Diagnosis not present

## 2017-10-04 ENCOUNTER — Telehealth: Payer: Self-pay | Admitting: Internal Medicine

## 2017-10-04 NOTE — Telephone Encounter (Signed)
Copied from CRM [phone]. Topic: Quick Communication - See Telephone Encounter >> Oct 04, 2017 11:16 AM Herby AbrahamJohnson, Shiquita C wrote: CRM for notification. See Telephone encounter for: 10/04/17.   Pt called in to cancel his upcoming apt with PCP. Pt says that he is now seeing a different PCP.

## 2017-10-09 ENCOUNTER — Ambulatory Visit: Payer: Federal, State, Local not specified - PPO | Admitting: Nurse Practitioner

## 2017-10-09 DIAGNOSIS — G4733 Obstructive sleep apnea (adult) (pediatric): Secondary | ICD-10-CM | POA: Diagnosis not present

## 2017-10-09 DIAGNOSIS — I10 Essential (primary) hypertension: Secondary | ICD-10-CM | POA: Diagnosis not present

## 2017-10-16 ENCOUNTER — Ambulatory Visit: Payer: Self-pay | Admitting: Internal Medicine

## 2017-11-08 ENCOUNTER — Other Ambulatory Visit: Payer: Self-pay | Admitting: Internal Medicine

## 2017-11-08 DIAGNOSIS — K219 Gastro-esophageal reflux disease without esophagitis: Secondary | ICD-10-CM

## 2017-11-08 DIAGNOSIS — I10 Essential (primary) hypertension: Secondary | ICD-10-CM | POA: Diagnosis not present

## 2017-11-08 DIAGNOSIS — G4733 Obstructive sleep apnea (adult) (pediatric): Secondary | ICD-10-CM | POA: Diagnosis not present

## 2017-11-08 MED ORDER — PANTOPRAZOLE SODIUM 40 MG PO TBEC
40.0000 mg | DELAYED_RELEASE_TABLET | ORAL | 1 refills | Status: AC
Start: 2017-11-08 — End: ?

## 2017-12-09 DIAGNOSIS — I10 Essential (primary) hypertension: Secondary | ICD-10-CM | POA: Diagnosis not present

## 2017-12-09 DIAGNOSIS — G4733 Obstructive sleep apnea (adult) (pediatric): Secondary | ICD-10-CM | POA: Diagnosis not present

## 2018-01-17 ENCOUNTER — Other Ambulatory Visit: Payer: Self-pay | Admitting: Internal Medicine

## 2018-01-17 DIAGNOSIS — I10 Essential (primary) hypertension: Secondary | ICD-10-CM

## 2018-01-17 MED ORDER — LOSARTAN POTASSIUM-HCTZ 100-25 MG PO TABS: 1.0000 | ORAL_TABLET | Freq: Every day | ORAL | 1 refills | Status: DC

## 2018-01-29 DIAGNOSIS — Z125 Encounter for screening for malignant neoplasm of prostate: Secondary | ICD-10-CM | POA: Diagnosis not present

## 2018-01-29 DIAGNOSIS — E119 Type 2 diabetes mellitus without complications: Secondary | ICD-10-CM | POA: Diagnosis not present

## 2018-01-29 DIAGNOSIS — I10 Essential (primary) hypertension: Secondary | ICD-10-CM | POA: Diagnosis not present

## 2018-01-29 DIAGNOSIS — E7800 Pure hypercholesterolemia, unspecified: Secondary | ICD-10-CM | POA: Diagnosis not present

## 2018-01-29 DIAGNOSIS — Z0000 Encounter for general adult medical examination without abnormal findings: Secondary | ICD-10-CM | POA: Diagnosis not present

## 2018-01-29 DIAGNOSIS — Z23 Encounter for immunization: Secondary | ICD-10-CM | POA: Diagnosis not present

## 2018-01-29 DIAGNOSIS — Z862 Personal history of diseases of the blood and blood-forming organs and certain disorders involving the immune mechanism: Secondary | ICD-10-CM | POA: Diagnosis not present

## 2018-01-29 DIAGNOSIS — Z7251 High risk heterosexual behavior: Secondary | ICD-10-CM | POA: Diagnosis not present

## 2018-02-06 ENCOUNTER — Other Ambulatory Visit: Payer: Self-pay

## 2018-02-06 MED ORDER — ATORVASTATIN CALCIUM 20 MG PO TABS: 20.0000 mg | ORAL_TABLET | Freq: Every day | ORAL | 1 refills | Status: AC

## 2018-02-21 DIAGNOSIS — G4733 Obstructive sleep apnea (adult) (pediatric): Secondary | ICD-10-CM | POA: Diagnosis not present

## 2018-02-21 DIAGNOSIS — I10 Essential (primary) hypertension: Secondary | ICD-10-CM | POA: Diagnosis not present

## 2018-02-26 ENCOUNTER — Ambulatory Visit: Payer: Self-pay | Admitting: Pulmonary Disease

## 2018-08-09 ENCOUNTER — Other Ambulatory Visit (HOSPITAL_COMMUNITY): Payer: Self-pay | Admitting: Family Medicine

## 2018-08-09 ENCOUNTER — Other Ambulatory Visit: Payer: Self-pay | Admitting: Family Medicine

## 2018-08-09 DIAGNOSIS — R1011 Right upper quadrant pain: Secondary | ICD-10-CM | POA: Diagnosis not present

## 2018-08-09 DIAGNOSIS — K591 Functional diarrhea: Secondary | ICD-10-CM | POA: Diagnosis not present

## 2018-08-14 ENCOUNTER — Ambulatory Visit
Admission: RE | Admit: 2018-08-14 | Discharge: 2018-08-14 | Disposition: A | Discharge status: Home / Self Care | Payer: Federal, State, Local not specified - PPO | Source: Ambulatory Visit | Attending: Family Medicine | Admitting: Family Medicine

## 2018-08-14 DIAGNOSIS — R1011 Right upper quadrant pain: Secondary | ICD-10-CM | POA: Insufficient documentation

## 2018-08-14 DIAGNOSIS — K7689 Other specified diseases of liver: Secondary | ICD-10-CM | POA: Diagnosis not present

## 2018-08-15 DIAGNOSIS — R6889 Other general symptoms and signs: Secondary | ICD-10-CM | POA: Diagnosis not present

## 2018-08-15 DIAGNOSIS — J019 Acute sinusitis, unspecified: Secondary | ICD-10-CM | POA: Diagnosis not present

## 2018-08-15 DIAGNOSIS — B9689 Other specified bacterial agents as the cause of diseases classified elsewhere: Secondary | ICD-10-CM | POA: Diagnosis not present

## 2018-08-15 DIAGNOSIS — J209 Acute bronchitis, unspecified: Secondary | ICD-10-CM | POA: Diagnosis not present

## 2018-08-15 DIAGNOSIS — J029 Acute pharyngitis, unspecified: Secondary | ICD-10-CM | POA: Diagnosis not present

## 2018-10-01 DIAGNOSIS — E119 Type 2 diabetes mellitus without complications: Secondary | ICD-10-CM | POA: Diagnosis not present

## 2018-10-01 DIAGNOSIS — E7800 Pure hypercholesterolemia, unspecified: Secondary | ICD-10-CM | POA: Diagnosis not present

## 2018-10-01 DIAGNOSIS — Z862 Personal history of diseases of the blood and blood-forming organs and certain disorders involving the immune mechanism: Secondary | ICD-10-CM | POA: Diagnosis not present

## 2018-10-01 DIAGNOSIS — I10 Essential (primary) hypertension: Secondary | ICD-10-CM | POA: Diagnosis not present

## 2018-10-09 DIAGNOSIS — E7800 Pure hypercholesterolemia, unspecified: Secondary | ICD-10-CM | POA: Diagnosis not present

## 2018-10-09 DIAGNOSIS — E1169 Type 2 diabetes mellitus with other specified complication: Secondary | ICD-10-CM | POA: Diagnosis not present

## 2018-10-09 DIAGNOSIS — Z862 Personal history of diseases of the blood and blood-forming organs and certain disorders involving the immune mechanism: Secondary | ICD-10-CM | POA: Diagnosis not present

## 2018-10-09 DIAGNOSIS — I10 Essential (primary) hypertension: Secondary | ICD-10-CM | POA: Diagnosis not present

## 2018-10-22 ENCOUNTER — Telehealth: Payer: Self-pay | Admitting: Internal Medicine

## 2018-10-22 NOTE — Telephone Encounter (Signed)
Pt c/o BP issue: STAT if pt c/o blurred vision, one-sided weakness or slurred speech  1. What are your last 5 BP readings?  This morning 154/92 HR 105 133/76 HR 101  2. Are you having any other symptoms (ex. Dizziness, headache, blurred vision, passed out)? Ears ringing, left ankle aches, cramps in legs  3. What is your BP issue? Patient's blood pressure has been high.  Patient has scheduled an appointment with Dr End on 5/6.  Please call to discuss.

## 2018-10-22 NOTE — Telephone Encounter (Signed)
Virtual Visit Pre-Appointment Phone Call  "(Name), I am calling you today to discuss your upcoming appointment. We are currently trying to limit exposure to the virus that causes COVID-19 by seeing patients at home rather than in the office."  1. "What is the BEST phone number to call the day of the visit?" - include this in appointment notes  2. Do you have or have access to (through a family member/friend) a smartphone with video capability that we can use for your visit?" a. If yes - list this number in appt notes as cell (if different from BEST phone #) and list the appointment type as a VIDEO visit in appointment notes b. If no - list the appointment type as a PHONE visit in appointment notes  3. Confirm consent - "In the setting of the current Covid19 crisis, you are scheduled for a (phone or video) visit with your provider on (date) at (time).  Just as we do with many in-office visits, in order for you to participate in this visit, we must obtain consent.  If you'd like, I can send this to your mychart (if signed up) or email for you to review.  Otherwise, I can obtain your verbal consent now.  All virtual visits are billed to your insurance company just like a normal visit would be.  By agreeing to a virtual visit, we'd like you to understand that the technology does not allow for your provider to perform an examination, and thus may limit your provider's ability to fully assess your condition. If your provider identifies any concerns that need to be evaluated in person, we will make arrangements to do so.  Finally, though the technology is pretty good, we cannot assure that it will always work on either your or our end, and in the setting of a video visit, we may have to convert it to a phone-only visit.  In either situation, we cannot ensure that we have a secure connection.  Are you willing to proceed?" STAFF: Did the patient verbally acknowledge consent to telehealth visit? Document  YES/NO here: Yes   4. Advise patient to be prepared - "Two hours prior to your appointment, go ahead and check your blood pressure, pulse, oxygen saturation, and your weight (if you have the equipment to check those) and write them all down. When your visit starts, your provider will ask you for this information. If you have an Apple Watch or Kardia device, please plan to have heart rate information ready on the day of your appointment. Please have a pen and paper handy nearby the day of the visit as well."  5. Give patient instructions for MyChart download to smartphone OR Doximity/Doxy.me as below if video visit (depending on what platform provider is using)  6. Inform patient they will receive a phone call 15 minutes prior to their appointment time (may be from unknown caller ID) so they should be prepared to answer    TELEPHONE CALL NOTE  Curtis Harper Bebeau has been deemed a candidate for a follow-up tele-health visit to limit community exposure during the Covid-19 pandemic. I spoke with the patient via phone to ensure availability of phone/video source, confirm preferred email & phone number, and discuss instructions and expectations.  I reminded Curtis Harper Schomer to be prepared with any vital sign and/or heart rhythm information that could potentially be obtained via home monitoring, at the time of his visit. I reminded Curtis Harper Lorman to expect a phone call prior  to his visit.  Curtis Harper 10/22/2018 2:16 PM   INSTRUCTIONS FOR DOWNLOADING THE MYCHART APP TO SMARTPHONE  - The patient must first make sure to have activated MyChart and know their login information - If Apple, go to [employer] and type in MyChart in the search bar and download the app. If Android, ask patient to go to [employer] and type in [place] in the search bar and download the app. The app is free but as with any other app downloads, their phone may require them to verify saved payment information or  Apple/Android password.  - The patient will need to then log into the app with their MyChart username and password, and select [facility] as their healthcare provider to link the account. When it is time for your visit, go to the MyChart app, find appointments, and click Begin Video Visit. Be sure to Select Allow for your device to access the Microphone and Camera for your visit. You will then be connected, and your provider will be with you shortly.  **If they have any issues connecting, or need assistance please contact MyChart service desk [phone])**  **If using a computer, in order to ensure the best quality for their visit they will need to use either of the following Internet Browsers: [employer] or Google Chrome**  IF USING DOXIMITY or DOXY.ME - The patient will receive a link just prior to their visit by text.     FULL LENGTH CONSENT FOR TELE-HEALTH VISIT   I hereby voluntarily request, consent and authorize [facility] and its employed or contracted physicians, physician assistants, nurse practitioners or other licensed health care professionals (the Practitioner), to provide me with telemedicine health care services (the Services") as deemed necessary by the treating Practitioner. I acknowledge and consent to receive the Services by the Practitioner via telemedicine. I understand that the telemedicine visit will involve communicating with the Practitioner through live audiovisual communication technology and the disclosure of certain medical information by electronic transmission. I acknowledge that I have been given the opportunity to request an in-person assessment or other available alternative prior to the telemedicine visit and am voluntarily participating in the telemedicine visit.  I understand that I have the right to withhold or withdraw my consent to the use of telemedicine in the course of my care at any time, without affecting my right to future care  or treatment, and that the Practitioner or I may terminate the telemedicine visit at any time. I understand that I have the right to inspect all information obtained and/or recorded in the course of the telemedicine visit and may receive copies of available information for a reasonable fee.  I understand that some of the potential risks of receiving the Services via telemedicine include:   Delay or interruption in medical evaluation due to technological equipment failure or disruption;  Information transmitted may not be sufficient (e.g. poor resolution of images) to allow for appropriate medical decision making by the Practitioner; and/or   In rare instances, security protocols could fail, causing a breach of personal health information.  Furthermore, I acknowledge that it is my responsibility to provide information about my medical history, conditions and care that is complete and accurate to the best of my ability. I acknowledge that Practitioner's advice, recommendations, and/or decision may be based on factors not within their control, such as incomplete or inaccurate data provided by me or distortions of diagnostic images or specimens that may result from electronic transmissions. I  understand that the practice of medicine is not an exact science and that Practitioner makes no warranties or guarantees regarding treatment outcomes. I acknowledge that I will receive a copy of this consent concurrently upon execution via email to the email address I last provided but may also request a printed copy by calling the office of [facility].    I understand that my insurance will be billed for this visit.   I have read or had this consent read to me.  I understand the contents of this consent, which adequately explains the benefits and risks of the Services being provided via telemedicine.   I have been provided ample opportunity to ask questions regarding this consent and the Services and have had  my questions answered to my satisfaction.  I give my informed consent for the services to be provided through the use of telemedicine in my medical care  By participating in this telemedicine visit I agree to the above.

## 2018-10-23 NOTE — Telephone Encounter (Signed)
Call to patient. Left detailed message, ok per DPR.  Will call patient back when I hear from primary.

## 2018-10-23 NOTE — Progress Notes (Signed)
° °Virtual Visit via Video Note  ° °This visit type was conducted due to national recommendations for restrictions regarding the COVID-19 Pandemic (e.g. social distancing) in an effort to limit this patient's exposure and mitigate transmission in our community.  Due to his co-morbid illnesses, this patient is at least at moderate risk for complications without adequate follow up.  This format is felt to be most appropriate for this patient at this time.  All issues noted in this document were discussed and addressed.  A limited physical exam was performed with this format.  Please refer to the patient's chart for his consent to telehealth for [facility].  ° °Date:  10/24/2018  ° °ID:  Curtis Harper, DOB 05/19/1971, MRN [id number] ° °Patient Location: Home °Provider Location: Office ° °PCP:  Linthavong, Kanhka, MD  °Cardiologist:  Christopher End, MD  °Electrophysiologist:  None  ° °Evaluation Performed:  Follow-Up Visit ° °Chief Complaint: Elevated heart rate and shortness of breath ° °History of Present Illness:   ° °Curtis Harper is a 48 y.o. male with history of hypertension, hyperlipidemia, obstructive sleep apnea, obesity, and GERD.  We last met in 06/2016 for follow-up of transient intraoperative hypoxia.  Echocardiogram with bubble study was negative for right-to-left intracardiac shunt.  Mild mitral regurgitation was noted.  No further work-up or intervention was recommended. ° °Today, Curtis Harper notes that his heart rate has seemed elevated for the last few weeks.  He is aware of an increased heart rate and has found it to be around 100 bpm with blood pressure checks.  Blood pressure is also sometimes elevated, between 130-150/80-90.  He has also experienced progressive dyspnea on exertion and mild chest tightness when walking up stairs or performing other strenuous activities at work.  This is new since 6 to 12 months ago.  He has not had any symptoms at rest.  He continues to use BiPAP at night.   He occasionally notes dizziness when getting out of bed at night, most commonly after taking Advil PM to help him sleep.  He denies edema or weight gain.  In fact, he has lost about 6 pounds over the last few months.  He reports being compliant with his medications. ° °The patient does not have symptoms concerning for COVID-19 infection (fever, chills, cough, or new shortness of breath).  ° ° °Past Medical History:  °Diagnosis Date  °• Anal fistula   °• Complication of anesthesia   ° oxygen sats dropped  °• Difficult intubation   °• Diverticulosis of colon   °• ED (erectile dysfunction)   °• GERD (gastroesophageal reflux disease)   °• Hemorrhoids   °• History of rectal abscess   ° 11-12-2015;  09-16-2016  °• History of urinary retention 11/12/2015  ° large perirectal abscess  °• Hyperlipidemia   °• Hypertension   °• Migraine   °• OSA treated with BiPAP   ° last study done 09-16-2016  °• Perirectal abscess   ° °Past Surgical History:  °Procedure Laterality Date  °• CHOLECYSTECTOMY    °• COLONOSCOPY WITH ESOPHAGOGASTRODUODENOSCOPY (EGD)  06/29/2016  °• INCISION AND DRAINAGE ABSCESS N/A 11/12/2015  ° Procedure: INCISION AND DRAINAGE ABSCESS;  Surgeon: Diego F Pabon, MD;  Location: [facility] OR[facility];  Service: General;  Laterality: N/A;  °• INCISION AND DRAINAGE PERIRECTAL ABSCESS N/A 09/16/2016  ° Procedure: IRRIGATION AND DEBRIDEMENT PERIRECTAL ABSCESS;  Surgeon: Chelsea A Connor, MD;  Location: [facility];  Service: General;  Laterality: N/A;  °• INGUINAL HERNIA REPAIR Left   REPAIR Left 06/09/2016   Procedure: HERNIA REPAIR INGUINAL ADULT;  Surgeon: Olean Ree, MD;  Location: [facility] OR[facility];  Service: General;  Laterality: Left;   LAPAROSCOPY  05/17/2016   Procedure: LAPAROSCOPY DIAGNOSTIC;  Surgeon: Olean Ree, MD;  Location: [facility];  Service: General;;   LUMBAR SPINE SURGERY  1999   L5   NECK FUSION  2013   ROTATOR CUFF REPAIR Left    TONSILLECTOMY AND ADENOIDECTOMY     TRANSTHORACIC ECHOCARDIOGRAM  05/30/2016   ef 60-65%/   mild MR and PR/ mild LAE and RAE/ trivial TR     Current Meds  Medication Sig   albuterol (VENTOLIN HFA) 108 (90 Base) MCG/ACT inhaler as needed.   atorvastatin (LIPITOR) 20 MG tablet Take 1 tablet (20 mg total) by mouth daily at 6 PM.   fluticasone (FLONASE) 50 MCG/ACT nasal spray Place into the nose daily.   meloxicam (MOBIC) 15 MG tablet as needed.   metFORMIN (GLUCOPHAGE) 500 MG tablet Take 1 tablet (500 mg total) by mouth 2 (two) times daily with a meal. (Patient taking differently: Take 500 mg by mouth daily with breakfast. )   metoprolol succinate (TOPROL-XL) 25 MG 24 hr tablet TAKE 1 TABLET BY MOUTH EVERY DAY   pantoprazole (PROTONIX) 40 MG tablet Take 1 tablet (40 mg total) by mouth every morning. 30 minutes before food   sildenafil (REVATIO) 20 MG tablet Take 1-5 tablets (20-100 mg total) by mouth daily as needed.   valsartan-hydrochlorothiazide (DIOVAN-HCT) 160-25 MG tablet Take 1 tablet by mouth daily.     Allergies:   Lisinopril   Social History   Tobacco Use   Smoking status: Former Smoker    Packs/day: 0.50    Years: 10.00    Pack years: 5.00    Types: Cigarettes    Last attempt to quit: 05/11/1996    Years since quitting: 22.4   Smokeless tobacco: Never Used  Substance Use Topics   Alcohol use: Yes    Alcohol/week: 3.0 standard drinks    Types: 1 Glasses of wine, 2 Cans of beer per week    Comment: occ   Drug use: No     Family Hx: The patient's family history includes Hyperlipidemia in his mother; Hypertension in his mother; Prostate cancer in his maternal grandfather; Stomach cancer in his father. There is no history of Colon cancer, Colon polyps, Esophageal cancer, or Rectal cancer.  ROS:   Patient notes occasional aching of the left shin.  No swelling or injury to the area.  Otherwise, a 12 system review of systems was negative except as noted in the HPI.   Prior CV studies:   The following studies were reviewed today:  TTE with bubble  study (05/30/16): Normal biventricular size and function (LVEF 60-65%). Mild mitral regurgitation and mild biatrial enlargement. Negative bubble study without evidence of atrial or pulmonary right to left shunt.  Labs/Other Tests and Data Reviewed:    EKG:  No ECG reviewed.  Recent Labs: No results found for requested labs within last 8760 hours.   Recent Lipid Panel Lab Results  Component Value Date/Time   CHOL 203 (H) 07/17/2017 09:16 AM   TRIG 112.0 07/17/2017 09:16 AM   HDL 52.00 07/17/2017 09:16 AM   CHOLHDL 4 07/17/2017 09:16 AM   LDLCALC 129 (H) 07/17/2017 09:16 AM   LDLDIRECT 120.0 06/21/2016 08:50 AM    Wt Readings from Last 3 Encounters:  10/24/18 262 lb (118.8 kg)  07/17/17 273 lb (123.8 kg)  01/09/17  120.7 kg)  °  ° °Objective:   ° °Vital Signs:  BP (!) 134/95 (BP Location: Left Arm, Patient Position: Sitting, Cuff Size: Normal)    Pulse 90    Ht 5' 11" (1.803 m)    Wt 262 lb (118.8 kg)    BMI 36.54 kg/m²   ° °VITAL SIGNS:  reviewed °GEN:  no acute distress ° °ASSESSMENT & PLAN:   ° °Dyspnea on exertion and chest pain: °Symptoms could be due to a number of causes but are certainly concerning for underlying cardiac etiology.  Risk factors for coronary artery disease include hypertension, hyperlipidemia, obesity, and male gender.  We discussed ischemia evaluation but have agreed to begin with escalation of medical therapy in order to minimize potential COVID-19 exposures.  We will therefore increase metoprolol succinate to 50 mg daily.  I have also asked Mr. Gaxiola to begin taking aspirin 81 mg daily pending evaluation. ° °Palpitations: °Mr. Szczygiel notes the sensation of his heart beating faster than normal and has also been getting elevated pulse readings around 100 bpm (90 bpm today).  I have recommended increasing metoprolol succinate to 50 mg daily.  We will have Mr. Loisel come to the office in 1 month for follow-up, including EKG. ° °Hypertension: °Blood  pressure mildly elevated.  As above, we will increase metoprolol succinate to 50 mg daily.  Mr. Clary should continue current dose of valsartan-HCTZ. ° °Mitral regurgitation: °Mild MR noted by echo in 2018.  We will reassess this clinically when Mr. Robins follows up in a month and discuss the need for repeating an echo at that time. ° °COVID-19 Education: °The signs and symptoms of COVID-19 were discussed with the patient and how to seek care for testing (follow up with PCP or arrange E-visit).  The importance of social distancing was discussed today. ° °Time:   °Today, I have spent 18 minutes with the patient with telehealth technology discussing the above problems.  An additional 10 minutes were spent reviewing the patient's chart and documenting today's encounter. ° °Medication Adjustments/Labs and Tests Ordered: °Current medicines are reviewed at length with the patient today.  Concerns regarding medicines are outlined above.  ° °Tests Ordered: °None. ° °Medication Changes: °Meds ordered this encounter  °Medications  °• metoprolol succinate (TOPROL-XL) 50 MG 24 hr tablet  °  Sig: Take 1 tablet (50 mg total) by mouth daily. Take with or immediately following a meal.  °  Dispense:  90 tablet  °  Refill:  3  °• aspirin EC 81 MG tablet  °  Sig: Take 1 tablet (81 mg total) by mouth daily.  °  Dispense:  90 tablet  °  Refill:  3  ° ° °Disposition:  Follow up in 1 month(s) in the office with a EKG at that time. ° °Signed, °Christopher End, MD  °10/24/2018 1:58 PM    °[facility] ° °

## 2018-10-23 NOTE — Telephone Encounter (Signed)
Returned call to patient. He reports intermittent HTN and tachycardia for 2 weeks.  Recently saw PCP and BP was normal at visit.   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 He takes advil PM to sleep and feels it may be contributing to sx.   BP yesterday; 154/93 HR 105, 133/76 HR 101, 6:30 PM 134/78 HR 99. He has not taken today.   Sx include; nausea, ringing in ears, ankle cramps, heart racing.  He does endorse drinking coffee frequently d/t altered sleep.   He denies chest pain or swelling in extremities but has SOB on exertion.   Has e visit scheduled with Dr. Okey Dupre 5/6.  Routed to provider on call to see if we need to make changes prior to OV.

## 2018-10-23 NOTE — Telephone Encounter (Signed)
Incoming call from patient. Current vitals 132/72 hr 91 1:30 pm.   Pt agreeable to POC.   Advised pt to call for any further questions or concerns.

## 2018-10-23 NOTE — Telephone Encounter (Signed)
I will defer any changes in his medications or treatment plan to his primary cardiologist, given he has an appointment scheduled for tomorrow 5/6.   Please have him continue to document his BP/HR and record the time of day and if these readings are before or after he takes his BP medications. Please ensure he takes his BP seated with both feet flat on the floor and after five minutes of rest with arm supported.  If there is chance of COVID-19 exposure, or if he develops a fever, he should get tested. He should go to the ER if he has new onset chest pain, loss of consciousness, numbness/weakness, sudden and severe back pain, change in vision, or difficulty speaking.

## 2018-10-24 ENCOUNTER — Telehealth (INDEPENDENT_AMBULATORY_CARE_PROVIDER_SITE_OTHER): Payer: Federal, State, Local not specified - PPO | Admitting: Internal Medicine

## 2018-10-24 ENCOUNTER — Other Ambulatory Visit: Payer: Self-pay

## 2018-10-24 ENCOUNTER — Encounter: Payer: Self-pay | Admitting: Internal Medicine

## 2018-10-24 VITALS — BP 134/95 | HR 90 | Ht 71.0 in | Wt 262.0 lb

## 2018-10-24 DIAGNOSIS — R002 Palpitations: Secondary | ICD-10-CM | POA: Diagnosis not present

## 2018-10-24 DIAGNOSIS — R0609 Other forms of dyspnea: Secondary | ICD-10-CM

## 2018-10-24 DIAGNOSIS — R079 Chest pain, unspecified: Secondary | ICD-10-CM | POA: Insufficient documentation

## 2018-10-24 DIAGNOSIS — I340 Nonrheumatic mitral (valve) insufficiency: Secondary | ICD-10-CM

## 2018-10-24 DIAGNOSIS — R0789 Other chest pain: Secondary | ICD-10-CM

## 2018-10-24 DIAGNOSIS — I10 Essential (primary) hypertension: Secondary | ICD-10-CM

## 2018-10-24 DIAGNOSIS — R0600 Dyspnea, unspecified: Secondary | ICD-10-CM | POA: Insufficient documentation

## 2018-10-24 MED ORDER — ASPIRIN EC 81 MG PO TBEC: 81.0000 mg | DELAYED_RELEASE_TABLET | Freq: Every day | ORAL | 3 refills | Status: DC

## 2018-10-24 MED ORDER — METOPROLOL SUCCINATE ER 50 MG PO TB24: 50.0000 mg | ORAL_TABLET | Freq: Every day | ORAL | 3 refills | Status: DC

## 2018-10-24 NOTE — Patient Instructions (Signed)
Medication Instructions:  Your physician has recommended you make the following change in your medication:  1- INCREASE Metoprolol succinate to 50 mg by mouth once a day. 2- START Aspirin 81 mg by mouth once a day.  If you need a refill on your cardiac medications before your next appointment, please call your pharmacy.   Lab work: none If you have labs (blood work) drawn today and your tests are completely normal, you will receive your results only by: Marland Kitchen MyChart Message (if you have MyChart) OR . A paper copy in the mail If you have any lab test that is abnormal or we need to change your treatment, we will call you to review the results.  Testing/Procedures: none  Follow-Up: At [facility], you and your health needs are our priority.  As part of our continuing mission to provide you with exceptional heart care, we have created designated Provider Care Teams.  These Care Teams include your primary Cardiologist (physician) and Advanced Practice Providers (APPs -  Physician Assistants and Nurse Practitioners) who all work together to provide you with the care you need, when you need it. You will need a follow up appointment in 1 months in the office.  Please call our office 2 months in advance to schedule this appointment.  You may see Yvonne Kendall, MD or one of the following Advanced Practice Providers on your designated Care Team:   Nicolasa Ducking, NP Eula Listen, PA-C . Marisue Ivan, PA-C

## 2018-11-05 DIAGNOSIS — M5441 Lumbago with sciatica, right side: Secondary | ICD-10-CM | POA: Diagnosis not present

## 2018-12-07 ENCOUNTER — Ambulatory Visit: Payer: Federal, State, Local not specified - PPO | Admitting: Internal Medicine

## 2019-04-18 IMAGING — MR MR CHEST MEDIASTINUM WO/W CM
5 of 9 series · 21 of 40 positions shown · IV contrast (20 ML MULTIHANCE)
Comparison: None.

CLINICAL DATA: Soft tissue swelling of the chest just below the
left clavicle. Chest wall pain.

EXAM:
MRI CHEST WITHOUT AND WITH CONTRAST
TECHNIQUE: Multi echo multi planer images were obtained before and after IV
contrast administration.
CONTRAST:  20mL MULTIHANCE GADOBENATE DIMEGLUMINE 529 MG/ML IV SOLN

[Series 4: T1 · coronal · 4.0mm · 0.49mm/px · 4 of 30 slices shown (1 of 2)]
[im 1/30]
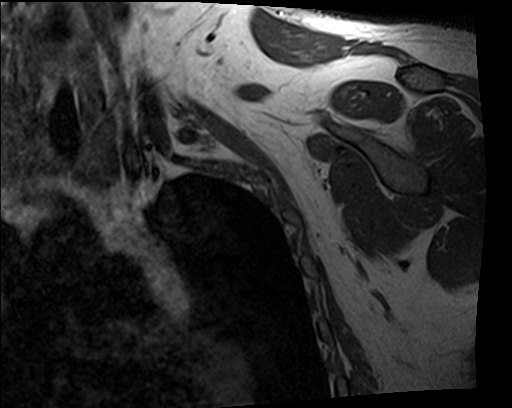
[im 10/30]
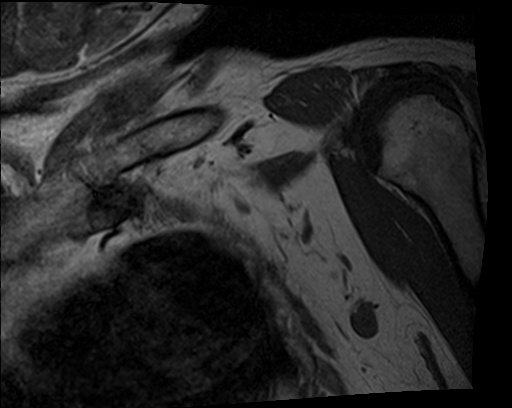
[im 20/30]
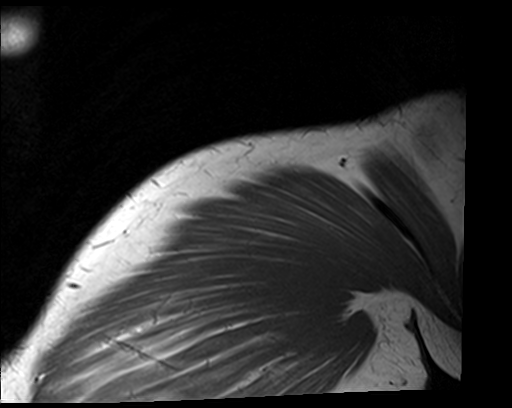
[im 30/30]
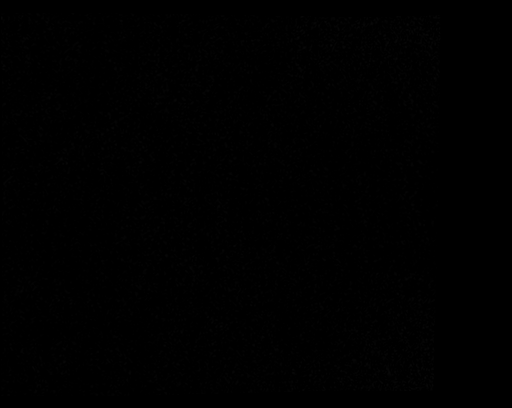

[Series 7: T1 fat-sat · axial · 4.0mm · 0.49mm/px · z∈[-114,+76]mm · 5 of 39 slices shown (1 of 3)]
[im 1/39]
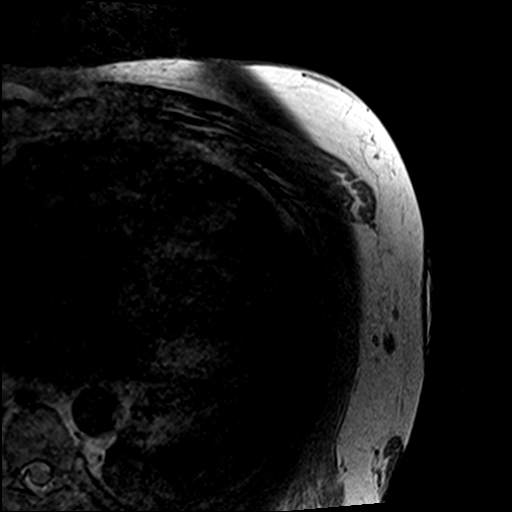
[im 10/39]
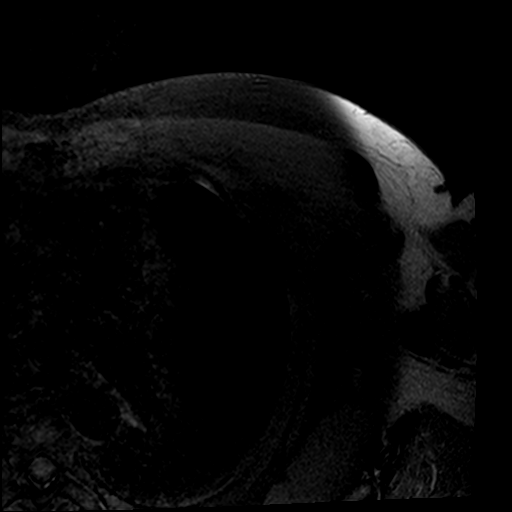
[im 20/39]
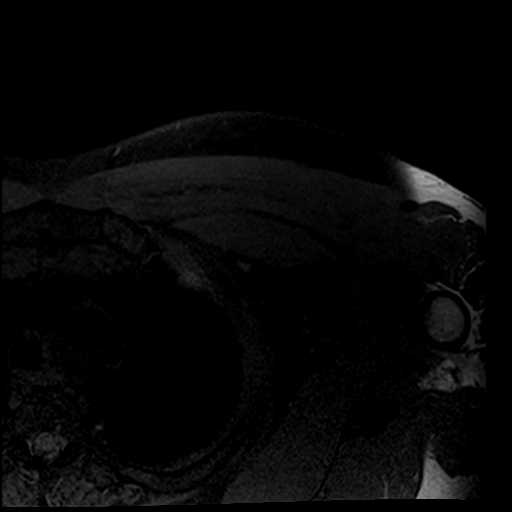
[im 29/39]
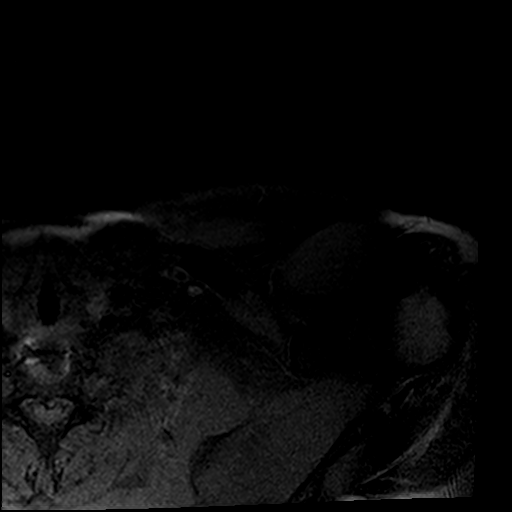
[im 39/39]
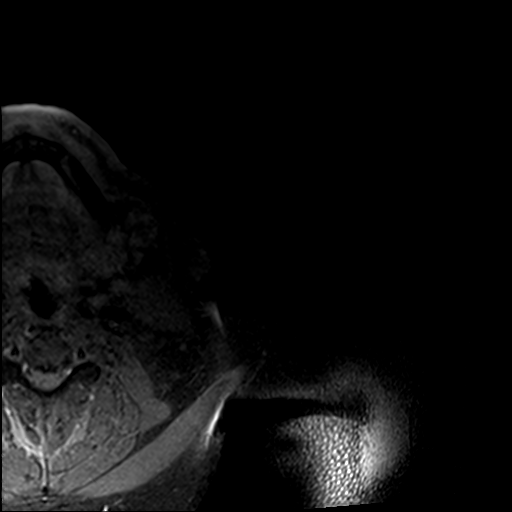

[Series 8: T1 · axial · 4.0mm · 0.49mm/px · z∈[-114,+76]mm · 5 of 39 slices shown (2 of 2)]
[im 1/39]
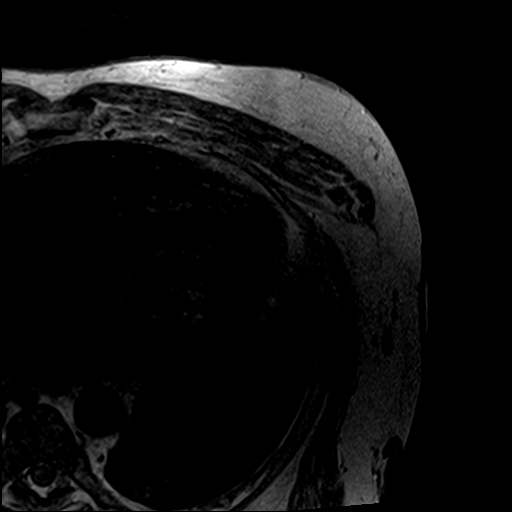
[im 10/39]
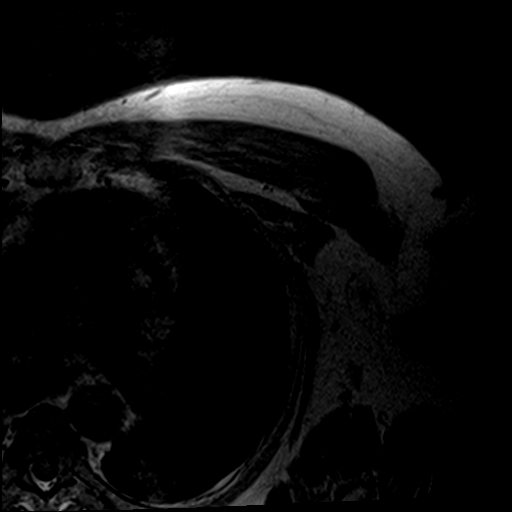
[im 20/39]
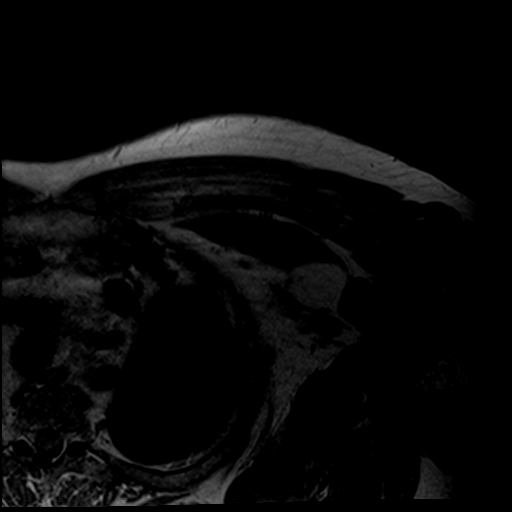
[im 29/39]
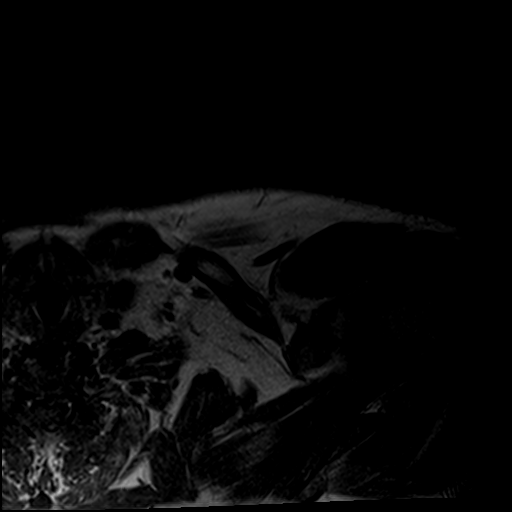
[im 39/39]
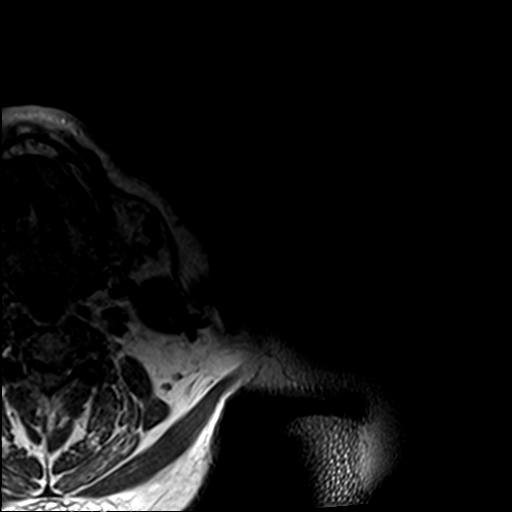

[Series 10: T1 fat-sat · axial · 4.0mm · 0.49mm/px · z∈[-114,+76]mm · 5 of 39 slices shown (2 of 3)]
[im 1/39]
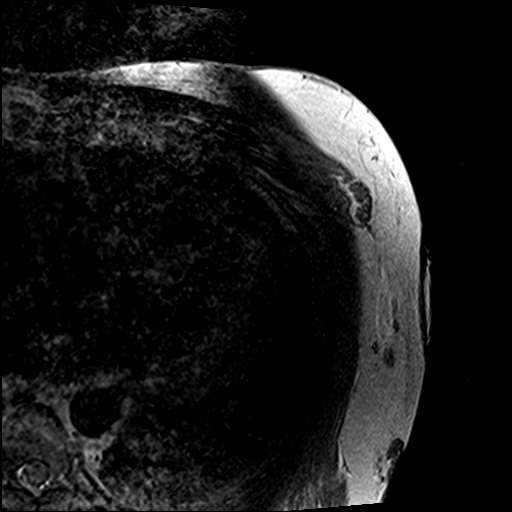
[im 10/39]
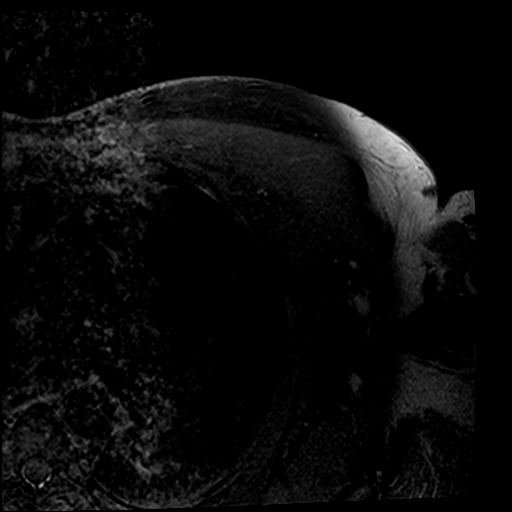
[im 20/39]
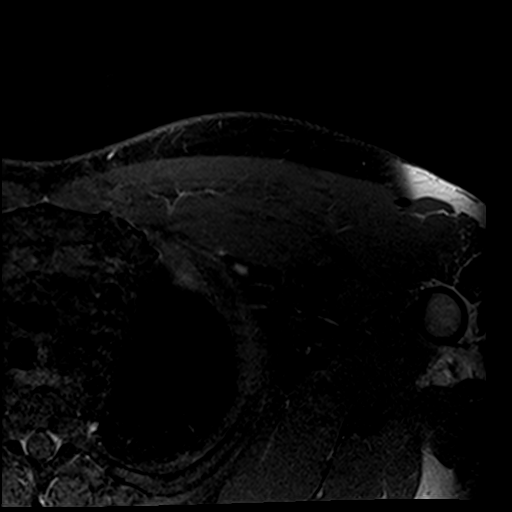
[im 29/39]
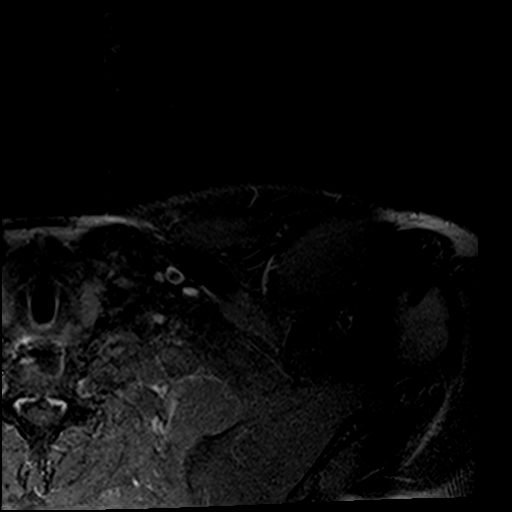
[im 39/39]
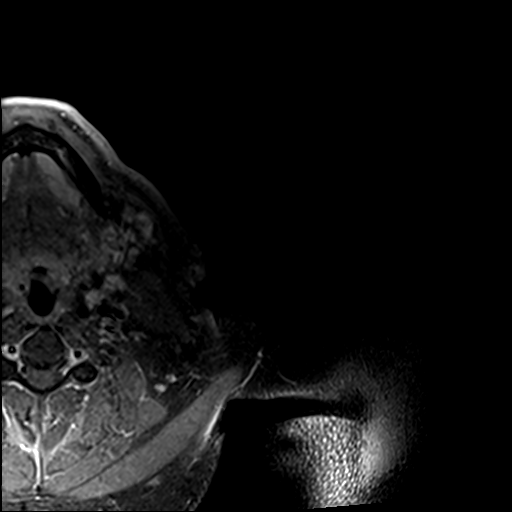

[Series 11: T1 fat-sat · coronal · 4.0mm · 0.49mm/px · 2 of 30 slices shown (3 of 3)]
[im 1/30]
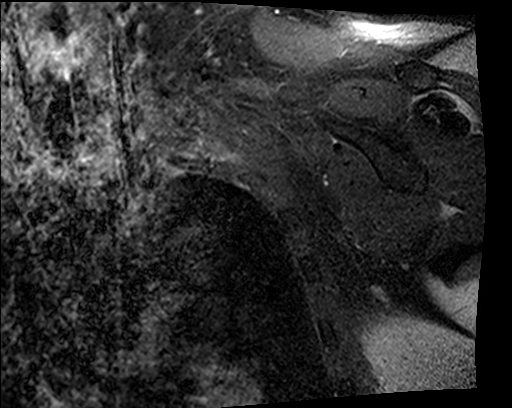
[im 10/30]
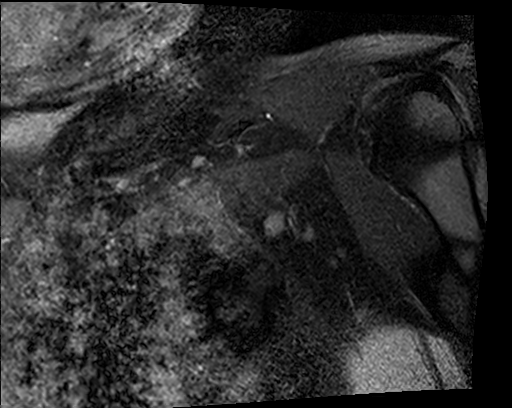

[21 of 40 positions shown; findings below may reference images not displayed]

FINDINGS: There is no evidence of a mass or adenopathy or abnormal enhancement
after contrast administration. The patient has prominent pectoralis
major and minor muscles. The visualized muscles around the rotator
cuff appear normal. Visualized portion of the left lung appears
normal. Vascular structures are normal.
IMPRESSION: Negative MRI of the left upper chest. Specifically, no evidence of a
mass lesion or muscle tear or myositis. No adenopathy.

## 2019-11-25 ENCOUNTER — Other Ambulatory Visit: Payer: Self-pay | Admitting: Internal Medicine

## 2019-11-25 NOTE — Telephone Encounter (Signed)
Scheduled for 6/21 with End

## 2019-11-25 NOTE — Telephone Encounter (Signed)
Please schedule overdue F/U appointment with Dr. End. Thank you! ?

## 2019-12-09 ENCOUNTER — Telehealth: Payer: Self-pay | Admitting: Internal Medicine

## 2019-12-09 ENCOUNTER — Other Ambulatory Visit: Payer: Self-pay

## 2019-12-09 ENCOUNTER — Encounter: Payer: Self-pay | Admitting: Internal Medicine

## 2019-12-09 ENCOUNTER — Ambulatory Visit (INDEPENDENT_AMBULATORY_CARE_PROVIDER_SITE_OTHER): Payer: Federal, State, Local not specified - PPO | Admitting: Internal Medicine

## 2019-12-09 VITALS — BP 142/96 | HR 87 | Ht 71.0 in | Wt 272.4 lb

## 2019-12-09 DIAGNOSIS — R0609 Other forms of dyspnea: Secondary | ICD-10-CM

## 2019-12-09 DIAGNOSIS — E119 Type 2 diabetes mellitus without complications: Secondary | ICD-10-CM | POA: Diagnosis not present

## 2019-12-09 DIAGNOSIS — E1169 Type 2 diabetes mellitus with other specified complication: Secondary | ICD-10-CM | POA: Diagnosis not present

## 2019-12-09 DIAGNOSIS — I10 Essential (primary) hypertension: Secondary | ICD-10-CM | POA: Diagnosis not present

## 2019-12-09 DIAGNOSIS — G4733 Obstructive sleep apnea (adult) (pediatric): Secondary | ICD-10-CM | POA: Diagnosis not present

## 2019-12-09 DIAGNOSIS — E785 Hyperlipidemia, unspecified: Secondary | ICD-10-CM

## 2019-12-09 DIAGNOSIS — R0600 Dyspnea, unspecified: Secondary | ICD-10-CM

## 2019-12-09 DIAGNOSIS — Z862 Personal history of diseases of the blood and blood-forming organs and certain disorders involving the immune mechanism: Secondary | ICD-10-CM | POA: Diagnosis not present

## 2019-12-09 NOTE — Patient Instructions (Addendum)
Medication Instructions:  Your physician recommends that you continue on your current medications as directed. Please refer to the Current Medication list given to you today.  *If you need a refill on your cardiac medications before your next appointment, please call your pharmacy*   Lab Work: none If you have labs (blood work) drawn today and your tests are completely normal, you will receive your results only by: Marland Kitchen MyChart Message (if you have MyChart) OR . A paper copy in the mail If you have any lab test that is abnormal or we need to change your treatment, we will call you to review the results.   Testing/Procedures:  Your physician has requested that you have a lexiscan myoview.   [facility] MYOVIEW  Your caregiver has ordered a Stress Test with nuclear imaging. The purpose of this test is to evaluate the blood supply to your heart muscle. This procedure is referred to as a "Non-Invasive Stress Test." This is because other than having an IV started in your vein, nothing is inserted or "invades" your body. Cardiac stress tests are done to find areas of poor blood flow to the heart by determining the extent of coronary artery disease (CAD). Some patients exercise on a treadmill, which naturally increases the blood flow to your heart, while others who are  unable to walk on a treadmill due to physical limitations have a pharmacologic/chemical stress agent called Lexiscan . This medicine will mimic walking on a treadmill by temporarily increasing your coronary blood flow.   Please note: these test may take anywhere between 2-4 hours to complete  PLEASE REPORT TO [facility] MEDICAL MALL ENTRANCE  THE VOLUNTEERS AT THE FIRST DESK WILL DIRECT YOU WHERE TO GO  Date of Procedure:_____________________________________  Arrival Time for Procedure:______________________________  Instructions regarding medication:   _XX___ : Hold diabetes medication morning of procedure - Metformin   PLEASE NOTIFY  THE OFFICE AT LEAST 24 HOURS IN ADVANCE IF YOU ARE UNABLE TO KEEP YOUR APPOINTMENT.  [phone] AND  PLEASE NOTIFY NUCLEAR MEDICINE AT [facility] AT LEAST 24 HOURS IN ADVANCE IF YOU ARE UNABLE TO KEEP YOUR APPOINTMENT. [phone]  How to prepare for your Myoview test:  1. Do not eat or drink after midnight 2. No caffeine for 24 hours prior to test 3. No smoking 24 hours prior to test. 4. Your medication may be taken with water.  If your doctor stopped a medication because of this test, do not take that medication. 5. Please wear a short sleeve shirt. 6. No perfume, cologne or lotion. 7. Wear comfortable walking shoes.   Follow-Up: At [facility], you and your health needs are our priority.  As part of our continuing mission to provide you with exceptional heart care, we have created designated Provider Care Teams.  These Care Teams include your primary Cardiologist (physician) and Advanced Practice Providers (APPs -  Physician Assistants and Nurse Practitioners) who all work together to provide you with the care you need, when you need it.  We recommend signing up for the patient portal called "MyChart".  Sign up information is provided on this After Visit Summary.  MyChart is used to connect with patients for Virtual Visits (Telemedicine).  Patients are able to view lab/test results, encounter notes, upcoming appointments, etc.  Non-urgent messages can be sent to your provider as well.   To learn more about what you can do with MyChart, go to [email or web address].    Your next appointment:   6 week(s)  The format for  your next appointment:   In Person  Provider:    You may see Yvonne Kendall, MD or one of the following Advanced Practice Providers on your designated Care Team:    Nicolasa Ducking, NP  Eula Listen, PA-C  Marisue Ivan, PA-C  Cardiac Nuclear Scan A cardiac nuclear scan is a test that measures blood flow to the heart when a person is resting and when he or  she is exercising. The test looks for problems such as:  Not enough blood reaching a portion of the heart.  The heart muscle not working normally. You may need this test if:  You have heart disease.  You have had abnormal lab results.  You have had heart surgery or a balloon procedure to open up blocked arteries (angioplasty).  You have chest pain.  You have shortness of breath. In this test, a radioactive dye (tracer) is injected into your bloodstream. After the tracer has traveled to your heart, an imaging device is used to measure how much of the tracer is absorbed by or distributed to various areas of your heart. This procedure is usually done at a hospital and takes 2-4 hours. Tell a health care provider about:  Any allergies you have.  All medicines you are taking, including vitamins, herbs, eye drops, creams, and over-the-counter medicines.  Any problems you or family members have had with anesthetic medicines.  Any blood disorders you have.  Any surgeries you have had.  Any medical conditions you have.  Whether you are pregnant or may be pregnant. What are the risks? Generally, this is a safe procedure. However, problems may occur, including:  Serious chest pain and heart attack. This is only a risk if the stress portion of the test is done.  Rapid heartbeat.  Sensation of warmth in your chest. This usually passes quickly.  Allergic reaction to the tracer. What happens before the procedure?  Ask your health care provider about changing or stopping your regular medicines. This is especially important if you are taking diabetes medicines or blood thinners.  Follow instructions from your health care provider about eating or drinking restrictions.  Remove your jewelry on the day of the procedure. What happens during the procedure?  An IV will be inserted into one of your veins.  Your health care provider will inject a small amount of radioactive tracer through  the IV.  You will wait for 20-40 minutes while the tracer travels through your bloodstream.  Your heart activity will be monitored with an electrocardiogram (ECG).  You will lie down on an exam table.  Images of your heart will be taken for about 15-20 minutes.  You may also have a stress test. For this test, one of the following may be done: ? You will exercise on a treadmill or stationary bike. While you exercise, your heart's activity will be monitored with an ECG, and your blood pressure will be checked. ? You will be given medicines that will increase blood flow to parts of your heart. This is done if you are unable to exercise.  When blood flow to your heart has peaked, a tracer will again be injected through the IV.  After 20-40 minutes, you will get back on the exam table and have more images taken of your heart.  Depending on the type of tracer used, scans may need to be repeated 3-4 hours later.  Your IV line will be removed when the procedure is over. The procedure may vary among health care  providers and hospitals. What happens after the procedure?  Unless your health care provider tells you otherwise, you may return to your normal schedule, including diet, activities, and medicines.  Unless your health care provider tells you otherwise, you may increase your fluid intake. This will help to flush the contrast dye from your body. Drink enough fluid to keep your urine pale yellow.  Ask your health care provider, or the department that is doing the test: ? When will my results be ready? ? How will I get my results? Summary  A cardiac nuclear scan measures the blood flow to the heart when a person is resting and when he or she is exercising.  Tell your health care provider if you are pregnant.  Before the procedure, ask your health care provider about changing or stopping your regular medicines. This is especially important if you are taking diabetes medicines or blood  thinners.  After the procedure, unless your health care provider tells you otherwise, increase your fluid intake. This will help flush the contrast dye from your body.  After the procedure, unless your health care provider tells you otherwise, you may return to your normal schedule, including diet, activities, and medicines. This information is not intended to replace advice given to you by your health care provider. Make sure you discuss any questions you have with your health care provider. Document Revised: 11/20/2017 Document Reviewed: 11/20/2017 Elsevier Patient Education  [place].

## 2019-12-09 NOTE — Telephone Encounter (Signed)
Patient states he had a Lexiscan performed in December at [facility].  Patient will have them fax results to our office

## 2019-12-09 NOTE — Progress Notes (Signed)
Follow-up Outpatient Visit Date: 12/09/2019  Primary Care Provider: Marisue Ivan, MD [phone] [facility] [address] [facility] [place] [address]  Chief Complaint: Shortness of breath  HPI:  Curtis Harper is a 49 y.o. male with history of hypertension, hyperlipidemia, obstructive sleep apnea, obesity, and GERD, who presents for follow-up of elevated heart rates and shortness of breath.  We last spoke via virtual visit in 10/2018, at which time Curtis Harper was concerned about elevated heart rates over the preceding few weeks.  He also complained of progressive exertional dyspnea and mild chest tightness when performing strenuous activities at work.  We discussed ischemia evaluation but deferred this in favor of medical therapy in the setting of COVID-19 pandemic.  Therefore, metoprolol succinate was increased to 50 mg daily.  Today, Curtis Harper reports that he has noticed progressive exertional dyspnea.  He even had to stop while walking across the parking lot today to get to the office.  This has been most pronounced over the last 3 to 6 months and is certainly worse than a year ago.  He denies chest pain/pressure.  He is sleeping nightly with CPAP, though he notes that his wife feels like he has some episodes of PND.  He also at times will gasp for breath when he is watching television.  He has not had any edema.  He does not feel like escalation of metoprolol last year made him feel any better.  Curtis Harper also notes episodic palpitations during which it feels like his heart begins to race.  These happen several times a day and are accompanied by mild lightheadedness.  Episodes usually last 10 minutes or less.  Curtis Harper checks his blood pressure about twice a week and notes that it is typically around 128-130/70.  He is concerned because he often wheezes when he first gets up in the morning.  He is scheduled to be seen at his PCPs office later  today.  --------------------------------------------------------------------------------------------------  Cardiovascular History & Procedures: Cardiovascular Problems:  Intraoperative hypoxia  Risk Factors:  Hypertension, hyperlipidemia, obesity, and male gender  Cath/PCI:  None  CV Surgery:  None  EP Procedures and Devices:  None  Non-Invasive Evaluation(s):  TTE with bubble study (05/30/16): Normal biventricular size and function (LVEF 60-65%). Mild mitral regurgitation and mild biatrial enlargement. Negative bubble study without evidence of atrial or pulmonary right to left shunt.  Recent CV Pertinent Labs: Lab Results  Component Value Date   CHOL 203 (H) 07/17/2017   HDL 52.00 07/17/2017   LDLCALC 129 (H) 07/17/2017   LDLDIRECT 120.0 06/21/2016   TRIG 112.0 07/17/2017   CHOLHDL 4 07/17/2017   K 4.0 07/17/2017   BUN 12 07/17/2017   BUN 11 01/18/2016   CREATININE 0.87 07/17/2017    Past medical and surgical history were reviewed and updated in EPIC.  Current Meds  Medication Sig   albuterol (VENTOLIN HFA) 108 (90 Base) MCG/ACT inhaler as needed.   aspirin EC 81 MG tablet Take 1 tablet (81 mg total) by mouth daily.   atorvastatin (LIPITOR) 20 MG tablet Take 1 tablet (20 mg total) by mouth daily at 6 PM.   fluticasone (FLONASE) 50 MCG/ACT nasal spray Place into the nose daily.   metFORMIN (GLUCOPHAGE) 500 MG tablet Take 1 tablet (500 mg total) by mouth 2 (two) times daily with a meal. (Patient taking differently: Take 500 mg by mouth daily with breakfast. )   metoprolol succinate (TOPROL-XL) 50 MG 24 hr tablet TAKE 1 TABLET (50  MG TOTAL) BY MOUTH DAILY. TAKE WITH OR IMMEDIATELY FOLLOWING A MEAL.   pantoprazole (PROTONIX) 40 MG tablet Take 1 tablet (40 mg total) by mouth every morning. 30 minutes before food   sildenafil (REVATIO) 20 MG tablet Take 1-5 tablets (20-100 mg total) by mouth daily as needed.   valsartan-hydrochlorothiazide  (DIOVAN-HCT) 160-25 MG tablet Take 1 tablet by mouth daily.    Allergies: Lisinopril  Social History   Tobacco Use   Smoking status: Former Smoker    Packs/day: 0.50    Years: 10.00    Pack years: 5.00    Types: Cigarettes    Quit date: 05/11/1996    Years since quitting: 23.5   Smokeless tobacco: Never Used  Substance Use Topics   Alcohol use: Yes    Alcohol/week: 3.0 standard drinks    Types: 1 Glasses of wine, 2 Cans of beer per week    Comment: occ   Drug use: No    Family History  Problem Relation Age of Onset   Stomach cancer Father        spread to lungs   Hypertension Mother    Hyperlipidemia Mother    Prostate cancer Maternal Grandfather    Colon cancer Neg Hx    Colon polyps Neg Hx    Esophageal cancer Neg Hx    Rectal cancer Neg Hx     Review of Systems: A 12-system review of systems was performed and was negative except as noted in the HPI.  --------------------------------------------------------------------------------------------------  Physical Exam: BP (!) 142/96 (BP Location: Left Arm, Patient Position: Sitting, Cuff Size: Normal)    Pulse 87    Ht 5\' 11"  (1.803 m)    Wt 272 lb 6 oz (123.5 kg)    SpO2 95%    BMI 37.99 kg/m   General: NAD. HEENT: No conjunctival pallor or scleral icterus. Facemask in place. Neck: No JVD or HJR, though body habitus limits evaluation. Lungs: Normal work of breathing. Clear to auscultation bilaterally without wheezes or crackles. Heart: Regular rate and rhythm without murmurs, rubs, or gallops. Abd: Bowel sounds present. Soft, NT/ND. Ext: No lower extremity edema. Radial, PT, and DP pulses are 2+ bilaterally.  EKG: Normal sinus rhythm with left axis deviation and borderline LVH.  Lab Results  Component Value Date   WBC 5.3 07/17/2017   HGB 13.6 07/17/2017   HCT 39.7 07/17/2017   MCV 86.8 07/17/2017   PLT 327.0 07/17/2017    Lab Results  Component Value Date   NA 140 07/17/2017   K 4.0  07/17/2017   CL 100 07/17/2017   CO2 31 07/17/2017   BUN 12 07/17/2017   CREATININE 0.87 07/17/2017   GLUCOSE 130 (H) 07/17/2017   ALT 16 07/17/2017    Lab Results  Component Value Date   CHOL 203 (H) 07/17/2017   HDL 52.00 07/17/2017   LDLCALC 129 (H) 07/17/2017   LDLDIRECT 120.0 06/21/2016   TRIG 112.0 07/17/2017   CHOLHDL 4 07/17/2017    --------------------------------------------------------------------------------------------------  ASSESSMENT AND PLAN: Dyspnea on exertion: Longstanding but more pronounced over the last 3 to 6 months.  Prior echo in 2017 was notable for normal LVEF with mild mitral regurgitation.  We had previously discussed myocardial perfusion stress testing, which Curtis Harper elected to defer last year.  I think would be worthwhile for [place] to proceed with this, given his multiple cardiac risk factors.  If stress test is unrevealing, it would be worthwhile for him to undergo repeat echo as well as  further pulmonary evaluation given his report of wheezing.  Hypertension: Blood pressure suboptimally controlled today, though typically better at home.  I have encouraged Curtis Harper to work on lifestyle modifications, including continued sodium restriction as well as weight loss through diet and exercise.  We will defer medication changes today.  Hyperlipidemia: LDL well controlled on last check a year ago.  Curtis Harper is due for physical later today, I will defer ongoing monitoring of lipids to his PCP.  Atorvastatin 20 mg daily should be continued.  Type 2 diabetes mellitus: Ongoing management per Dr. Burnadette Pop.  Weight loss through diet and exercise encouraged.  Follow-up: Return to clinic in 6 weeks.  Yvonne Kendall, MD 12/09/2019 8:42 AM

## 2019-12-10 DIAGNOSIS — E1169 Type 2 diabetes mellitus with other specified complication: Secondary | ICD-10-CM | POA: Diagnosis not present

## 2019-12-10 DIAGNOSIS — E785 Hyperlipidemia, unspecified: Secondary | ICD-10-CM | POA: Diagnosis not present

## 2019-12-10 DIAGNOSIS — I10 Essential (primary) hypertension: Secondary | ICD-10-CM | POA: Diagnosis not present

## 2019-12-14 ENCOUNTER — Encounter (HOSPITAL_COMMUNITY): Payer: Self-pay | Admitting: Emergency Medicine

## 2019-12-14 ENCOUNTER — Other Ambulatory Visit: Payer: Self-pay

## 2019-12-14 ENCOUNTER — Emergency Department (HOSPITAL_COMMUNITY)
Admission: EM | Admit: 2019-12-14 | Discharge: 2019-12-15 | Disposition: A | Discharge status: Home / Self Care | Payer: Federal, State, Local not specified - PPO | Attending: Emergency Medicine | Admitting: Emergency Medicine

## 2019-12-14 ENCOUNTER — Emergency Department (HOSPITAL_COMMUNITY): Payer: Federal, State, Local not specified - PPO

## 2019-12-14 DIAGNOSIS — Z79899 Other long term (current) drug therapy: Secondary | ICD-10-CM | POA: Insufficient documentation

## 2019-12-14 DIAGNOSIS — Z794 Long term (current) use of insulin: Secondary | ICD-10-CM | POA: Diagnosis not present

## 2019-12-14 DIAGNOSIS — Z87891 Personal history of nicotine dependence: Secondary | ICD-10-CM | POA: Diagnosis not present

## 2019-12-14 DIAGNOSIS — Z7982 Long term (current) use of aspirin: Secondary | ICD-10-CM | POA: Insufficient documentation

## 2019-12-14 DIAGNOSIS — K6289 Other specified diseases of anus and rectum: Secondary | ICD-10-CM | POA: Diagnosis not present

## 2019-12-14 DIAGNOSIS — K603 Anal fistula: Secondary | ICD-10-CM | POA: Diagnosis not present

## 2019-12-14 DIAGNOSIS — I10 Essential (primary) hypertension: Secondary | ICD-10-CM | POA: Diagnosis not present

## 2019-12-14 DIAGNOSIS — E119 Type 2 diabetes mellitus without complications: Secondary | ICD-10-CM | POA: Diagnosis not present

## 2019-12-14 DIAGNOSIS — K4090 Unilateral inguinal hernia, without obstruction or gangrene, not specified as recurrent: Secondary | ICD-10-CM | POA: Diagnosis not present

## 2019-12-14 DIAGNOSIS — K604 Rectal fistula: Secondary | ICD-10-CM | POA: Diagnosis not present

## 2019-12-14 LAB — CBC WITH DIFFERENTIAL/PLATELET
Abs Immature Granulocytes: 0.01 10*3/uL (ref 0.00–0.07)
Basophils Absolute: 0.1 10*3/uL (ref 0.0–0.1)
Basophils Relative: 1 %
Eosinophils Absolute: 0.4 10*3/uL (ref 0.0–0.5)
Eosinophils Relative: 5 %
HCT: 39.8 % (ref 39.0–52.0)
Hemoglobin: 13.5 g/dL (ref 13.0–17.0)
Immature Granulocytes: 0 %
Lymphocytes Relative: 35 %
Lymphs Abs: 2.4 10*3/uL (ref 0.7–4.0)
MCH: 29.6 pg (ref 26.0–34.0)
MCHC: 33.9 g/dL (ref 30.0–36.0)
MCV: 87.3 fL (ref 80.0–100.0)
Monocytes Absolute: 0.4 10*3/uL (ref 0.1–1.0)
Monocytes Relative: 6 %
Neutro Abs: 3.7 10*3/uL (ref 1.7–7.7)
Neutrophils Relative %: 53 %
Platelets: 297 10*3/uL (ref 150–400)
RBC: 4.56 MIL/uL (ref 4.22–5.81)
RDW: 12.3 % (ref 11.5–15.5)
WBC: 6.9 10*3/uL (ref 4.0–10.5)
nRBC: 0 % (ref 0.0–0.2)

## 2019-12-14 LAB — COMPREHENSIVE METABOLIC PANEL
ALT: 25 U/L (ref 0–44)
AST: 21 U/L (ref 15–41)
Albumin: 4.5 g/dL (ref 3.5–5.0)
Alkaline Phosphatase: 43 U/L (ref 38–126)
Anion gap: 13 (ref 5–15)
BUN: 9 mg/dL (ref 6–20)
CO2: 23 mmol/L (ref 22–32)
Calcium: 9.7 mg/dL (ref 8.9–10.3)
Chloride: 101 mmol/L (ref 98–111)
Creatinine, Ser: 0.86 mg/dL (ref 0.61–1.24)
GFR calc Af Amer: >60 mL/min (ref >60)
GFR calc non Af Amer: >60 mL/min (ref >60)
Glucose, Bld: 121 mg/dL — ABNORMAL HIGH (ref 70–99)
Potassium: 3.4 mmol/L — ABNORMAL LOW (ref 3.5–5.1)
Sodium: 137 mmol/L (ref 135–145)
Total Bilirubin: 0.7 mg/dL (ref 0.3–1.2)
Total Protein: 7.7 g/dL (ref 6.5–8.1)

## 2019-12-14 LAB — URINALYSIS, ROUTINE W REFLEX MICROSCOPIC
Bacteria, UA: NONE SEEN
Bilirubin Urine: NEGATIVE
Glucose, UA: NEGATIVE mg/dL
Ketones, ur: NEGATIVE mg/dL
Leukocytes,Ua: NEGATIVE
Nitrite: NEGATIVE
Protein, ur: NEGATIVE mg/dL
Specific Gravity, Urine: 1.018 (ref 1.005–1.030)
pH: 6 (ref 5.0–8.0)

## 2019-12-14 MED ORDER — KETOROLAC TROMETHAMINE 30 MG/ML IJ SOLN
30.0000 mg | Freq: Once | INTRAMUSCULAR | Status: AC
  Administered 2019-12-14: 30 mg via INTRAVENOUS
  Filled 2019-12-14: qty 1

## 2019-12-14 MED ORDER — IOHEXOL 300 MG/ML  SOLN
100.0000 mL | Freq: Once | INTRAMUSCULAR | Status: AC | PRN
  Administered 2019-12-14: 100 mL via INTRAVENOUS

## 2019-12-14 NOTE — ED Provider Notes (Addendum)
[facility] EMERGENCY DEPARTMENT Provider Note   CSN: [id number] Arrival date & time: 12/14/19  1515     History Chief Complaint  Patient presents with  . Abscess    Curtis Harper is a 49 y.o. male with PMH significant for anal fistula and rectal abscess who presents to the ED with complaints of perirectal mass and pain concerning for recurrent abscess.  Patient also states that he is having "dribbling" when he is trying to urinate which is consistent with his prior episodes of perirectal abscess that required OR washout and admission to the hospital.  Patient reports that his symptoms began yesterday and have been getting progressively worse.  He is complaining of 6 out of 10 pain while at rest.  It is worse when sitting on his area of swelling as it is particularly tender to touch.  He denies any obvious fevers or chills, abdominal pain, nausea or vomiting, hematuria, or other symptoms.  He has not had a bowel movement since onset of his pain symptoms so he can neither confirm nor deny pain with defecation.  HPI     Past Medical History:  Diagnosis Date  . Anal fistula   . Complication of anesthesia    oxygen sats dropped  . Difficult intubation   . Diverticulosis of colon   . ED (erectile dysfunction)   . GERD (gastroesophageal reflux disease)   . Hemorrhoids   . History of rectal abscess    11-12-2015;  09-16-2016  . History of urinary retention 11/12/2015   large perirectal abscess  . Hyperlipidemia   . Hypertension   . Migraine   . OSA treated with BiPAP    last study done 09-16-2016  . Perirectal abscess     Patient Active Problem List   Diagnosis Date Noted  . Hyperlipidemia 12/09/2019  . Type 2 diabetes mellitus without complication, without long-term current use of insulin (HCC) 12/09/2019  . Chest pain of uncertain etiology 10/24/2018  . Palpitations 10/24/2018  . DOE (dyspnea on exertion) 10/24/2018  . Prediabetes 07/17/2017  . Family  history of prostate cancer 07/17/2017  . Allergic rhinitis 07/17/2017  . Soft tissue swelling of chest wall 07/17/2017  . Respiratory infection 09/19/2016  . Nonrheumatic mitral valve regurgitation 07/05/2016  . S/P left inguinal hernia repair 06/23/2016  . Essential hypertension 03/24/2016  . Erectile dysfunction 03/24/2016  . Perirectal abscess 11/12/2015    Past Surgical History:  Procedure Laterality Date  . CHOLECYSTECTOMY    . COLONOSCOPY WITH ESOPHAGOGASTRODUODENOSCOPY (EGD)  06/29/2016  . INCISION AND DRAINAGE ABSCESS N/A 11/12/2015   Procedure: INCISION AND DRAINAGE ABSCESS;  Surgeon: Leafy Ro, MD;  Location: [facility] ORS;  Service: General;  Laterality: N/A;  . INCISION AND DRAINAGE PERIRECTAL ABSCESS N/A 09/16/2016   Procedure: IRRIGATION AND DEBRIDEMENT PERIRECTAL ABSCESS;  Surgeon: Berna Bue, MD;  Location: [facility] OR;  Service: General;  Laterality: N/A;  . INGUINAL HERNIA REPAIR Left 06/09/2016   Procedure: HERNIA REPAIR INGUINAL ADULT;  Surgeon: Henrene Dodge, MD;  Location: [facility] ORS;  Service: General;  Laterality: Left;  . LAPAROSCOPY  05/17/2016   Procedure: LAPAROSCOPY DIAGNOSTIC;  Surgeon: Henrene Dodge, MD;  Location: [facility] ORS;  Service: General;;  . LUMBAR SPINE SURGERY  1999   L5  . NECK FUSION  2013  . ROTATOR CUFF REPAIR Left   . TONSILLECTOMY AND ADENOIDECTOMY    . TRANSTHORACIC ECHOCARDIOGRAM  05/30/2016   ef 60-65%/  mild MR and PR/ mild LAE and RAE/ trivial  TR       Family History  Problem Relation Age of Onset  . Stomach cancer Father        spread to lungs  . Hypertension Mother   . Hyperlipidemia Mother   . Prostate cancer Maternal Grandfather   . Colon cancer Neg Hx   . Colon polyps Neg Hx   . Esophageal cancer Neg Hx   . Rectal cancer Neg Hx     Social History   Tobacco Use  . Smoking status: Former Smoker    Packs/day: 0.50    Years: 10.00    Pack years: 5.00    Types: Cigarettes    Quit date: 05/11/1996    Years since  quitting: 23.6  . Smokeless tobacco: Never Used  Substance Use Topics  . Alcohol use: Yes    Alcohol/week: 3.0 standard drinks    Types: 1 Glasses of wine, 2 Cans of beer per week    Comment: occ  . Drug use: No    Home Medications Prior to Admission medications   Medication Sig Start Date End Date Taking? Authorizing Provider  albuterol (VENTOLIN HFA) 108 (90 Base) MCG/ACT inhaler as needed. 08/15/18   [provider]  aspirin EC 81 MG tablet Take 1 tablet (81 mg total) by mouth daily. 10/24/18   End, Cristal Deer, MD  atorvastatin (LIPITOR) 20 MG tablet Take 1 tablet (20 mg total) by mouth daily at 6 PM. 02/06/18   McLean-Scocuzza, Pasty Spillers, MD  clindamycin (CLEOCIN) 150 MG capsule Take 3 capsules (450 mg total) by mouth 3 (three) times daily for 5 days. 12/15/19 12/20/19  Lorelee New, PA-C  fluticasone (FLONASE) 50 MCG/ACT nasal spray Place into the nose daily.    [provider]  metFORMIN (GLUCOPHAGE) 500 MG tablet Take 1 tablet (500 mg total) by mouth 2 (two) times daily with a meal. Patient taking differently: Take 500 mg by mouth daily with breakfast.  07/26/17   McLean-Scocuzza, Pasty Spillers, MD  metoprolol succinate (TOPROL-XL) 50 MG 24 hr tablet TAKE 1 TABLET (50 MG TOTAL) BY MOUTH DAILY. TAKE WITH OR IMMEDIATELY FOLLOWING A MEAL. 11/25/19 02/23/20  End, Cristal Deer, MD  pantoprazole (PROTONIX) 40 MG tablet Take 1 tablet (40 mg total) by mouth every morning. 30 minutes before food 11/08/17   McLean-Scocuzza, Pasty Spillers, MD  sildenafil (REVATIO) 20 MG tablet Take 1-5 tablets (20-100 mg total) by mouth daily as needed. 01/24/17   Tommie Sams, DO  valsartan-hydrochlorothiazide (DIOVAN-HCT) 160-25 MG tablet Take 1 tablet by mouth daily. 09/21/18   [provider]    Allergies    Lisinopril  Review of Systems   Review of Systems  Constitutional: Negative for chills and fever.  Gastrointestinal: Negative for abdominal pain.  Skin: Positive for color change and wound.    Neurological: Negative for weakness and numbness.    Physical Exam Updated Vital Signs BP (!) 136/91 (BP Location: Right Arm)   Pulse 93   Temp 98.5 F (36.9 C) (Oral)   Resp 17   SpO2 98%   Physical Exam Vitals and nursing note reviewed. Exam conducted with a chaperone present.  HENT:     Head: Normocephalic and atraumatic.  Eyes:     General: No scleral icterus.    Conjunctiva/sclera: Conjunctivae normal.  Cardiovascular:     Rate and Rhythm: Normal rate and regular rhythm.     Pulses: Normal pulses.     Heart sounds: Normal heart sounds.  Pulmonary:     Effort:  Pulmonary effort is normal. No respiratory distress.     Breath sounds: Normal breath sounds.  Abdominal:     Comments: Soft, nondistended.  No focal areas of tenderness.  No guarding.  NABS.  Genitourinary:   Musculoskeletal:     Comments: Area of mass with questionable fluctuance and erythema that is tracking towards rectum.  Tenderness appreciated when palpating anus.  Did not perform DRE to specifically assess for rectal wall pain.   Skin:    General: Skin is dry.  Neurological:     Mental Status: He is alert.     GCS: GCS eye subscore is 4. GCS verbal subscore is 5. GCS motor subscore is 6.  Psychiatric:        Mood and Affect: Mood normal.        Behavior: Behavior normal.        Thought Content: Thought content normal.     ED Results / Procedures / Treatments   Labs (all labs ordered are listed, but only abnormal results are displayed) Labs Reviewed  URINALYSIS, ROUTINE W REFLEX MICROSCOPIC - Abnormal; Notable for the following components:      Result Value   Hgb urine dipstick SMALL (*)    All other components within normal limits  COMPREHENSIVE METABOLIC PANEL - Abnormal; Notable for the following components:   Potassium 3.4 (*)    Glucose, Bld 121 (*)    All other components within normal limits  CBC WITH DIFFERENTIAL/PLATELET    EKG None  Radiology CT PELVIS W CONTRAST  Result  Date: 12/14/2019 CLINICAL DATA:  Pain and tenderness to rectum, history of perirectal abscess EXAM: CT PELVIS WITH CONTRAST TECHNIQUE: Multidetector CT imaging of the pelvis was performed using the standard protocol following the bolus administration of intravenous contrast. CONTRAST:  OMNIPAQUE IOHEXOL 300 MG/ML  SOLN COMPARISON:  12/10/2016, 11/12/2015, 09/15/2016 FINDINGS: Urinary Tract: No distal urinary tract calculi or obstructive uropathy. The dome of the bladder on the left extends toward the chronic left inguinal hernia, stable in appearance since prior study. Bowel: No bowel obstruction or ileus. Normal gas-filled appendix right lower quadrant. Soft tissue density in the left perianal region consistent with a complex fistula described on prior MRI. There is a 9 mm hypodensity along the 6 o'clock margin of the perianal tissues, compatible with the dorsal extent of the fistula on prior MRI. No large fluid collection or abscess on this exam. Vascular/Lymphatic: Vascular structures are grossly unremarkable. There is no pathologic adenopathy. Reproductive:  Prostate is not enlarged.  No gross abnormalities. Other: There is a fat containing left inguinal hernia. As described above, a portion of the left dome of the bladder extends toward the inguinal hernia, but does not protrude into the hernia sac. No bowel herniation. No significant change since prior CT evaluations. Musculoskeletal: There are no acute or destructive bony lesions. Reconstructed images demonstrate no additional findings. IMPRESSION: 1. Grossly stable findings of the complex perianal fistula seen on prior MRI. No large fluid collection or abscess on this exam. 2. Stable left inguinal hernia as described above. Electronically Signed   By: Sharlet Salina M.D.   On: 12/14/2019 23:44    Procedures Procedures (including critical care time)  Medications Ordered in ED Medications  clindamycin (CLEOCIN) capsule 450 mg (has no  administration in time range)  ketorolac (TORADOL) 30 MG/ML injection 30 mg (30 mg Intravenous Given 12/14/19 2152)  iohexol (OMNIPAQUE) 300 MG/ML solution 100 mL (100 mLs Intravenous Contrast Given 12/14/19 2314)  ED Course  I have reviewed the triage vital signs and the nursing notes.  Pertinent labs & imaging results that were available during my care of the patient were reviewed by me and considered in my medical decision making (see chart for details).  Clinical Course as of Dec 14 8  Sat Dec 14, 2019  2355 Spoke with Dr. Cleophas DunkerWhitfield, [facility], who reviewed patient's CT and does not feel as though any intervention is warranted at this time.  He also states that since there is no evidence to support food collection concerning for abscess, he feels as though antibiotics can be dispensed based on my personal consideration and exam.  He does not believe that they are absolutely warranted given the lack of fluid collection.  He advises that he follow-up with one of their colorectal specialists next week for ongoing evaluation and management of his stable fistula.     [GG]    Clinical Course User Index [GG] Lorelee NewGreen, Leeanna Slaby L, PA-C   MDM Rules/Calculators/A&P                          Patient has an extensive history of perirectal abscesses requiring OR incision and drainage procedures.  He was last admitted on 09/15/2016 for the symptoms and required IV antibiotics before he was taken to the OR by Dr. Doylene Canardonner.    Obtaining CT pelvis with contrast to assess for rectal involvement of his area of tenderness concerned for abscess here in the ED.  His inflammation does appear to be tracking towards the rectum and he has tenderness appreciated over anus.  I am most concerned for an ischiorectal abscess based on my physical exam in conjunction with his history.  We will also obtain basic laboratory work-up and keep patient n.p.o.  Labs: CBC with differential: No leukocytosis  concerning for large infection.  WNL. CMP: Mild hypokalemia at 3.4, but no other significant derangement. UA: Unremarkable.  Imaging: CT pelvis with contrast: CT is personally reviewed and demonstrate grossly stable findings of a complex perianal fistula previously seen on MRI.  There is no significant fluid collection or abscess on imaging that would warrant intervention.  On subsequent evaluation, patient was feeling improved after receiving IV Toradol here in the ED.  I called down to CT at 10:55 PM to help encourage the process for having the imaging obtained.    Spoke with Dr. Cleophas DunkerWhitfield, [facility] surgery [facility], who reviewed patient's CT and does not feel as though any intervention is warranted at this time.  He also states that since there is no evidence to support food collection concerning for abscess, he feels as though antibiotics can be dispensed based on my personal consideration and exam.  He does not believe that they are absolutely warranted given the lack of fluid collection.  He advises that he follow-up with one of their colorectal specialists next week for ongoing evaluation and management of his stable fistula.    I advised patient to call the [facility] to schedule an appointment with one of their colorectal specialists in the next week.  I will discharge patient home with a course of clindamycin antibiotics.  He will need to be assessed by his primary care provider in the next 48 to 72 hours.  Patient instructed to apply warm compresses to the affected area.  I have also provided him an attachment on sitz bath's.  Discussed with my attending who agrees  with assessment and plan.  Strict ED return precautions discussed.  All of the evaluation and work-up results were discussed with the patient and any family at bedside. They were provided opportunity to ask any additional questions and have none at this time. They have expressed understanding of verbal  discharge instructions as well as return precautions and are agreeable to the plan.    Final Clinical Impression(s) / ED Diagnoses Final diagnoses:  Perianal fistula    Rx / DC Orders ED Discharge Orders         Ordered    clindamycin (CLEOCIN) 150 MG capsule  3 times daily     Discontinue  Reprint     12/15/19 0004           Corena Herter, PA-C 12/15/19 0010    Corena Herter, PA-C 12/15/19 0016    Virgel Manifold, MD 12/15/19 1525

## 2019-12-14 NOTE — ED Triage Notes (Signed)
Pt c/o pain and tenderness to rectum, hx perirectal abscess, states this feels the same. Denies fevers at home, reports pain started yesterday.

## 2019-12-14 NOTE — ED Notes (Signed)
Pt transported to CT via stretcher.  

## 2019-12-15 MED ORDER — CLINDAMYCIN HCL 150 MG PO CAPS
450.0000 mg | ORAL_CAPSULE | Freq: Three times a day (TID) | ORAL | 0 refills | Status: AC
Start: 2019-12-15 — End: 2019-12-20

## 2019-12-15 MED ORDER — CLINDAMYCIN HCL 150 MG PO CAPS
450.0000 mg | ORAL_CAPSULE | Freq: Once | ORAL | Status: AC
  Administered 2019-12-15: 450 mg via ORAL
  Filled 2019-12-15: qty 3

## 2019-12-15 NOTE — ED Notes (Signed)
Patient verbalizes understanding of discharge instructions. Opportunity for questioning and answers were provided. Armband removed by staff, pt discharged from ED stable & ambulatory  

## 2019-12-15 NOTE — Discharge Instructions (Signed)
Please take your antibiotics, as prescribed.  I encourage you to apply warm compresses to the area.  Please read the attachment on sitz bath's.  Please follow-up with your primary care provider in the next 48 to 72 hours for wound recheck.  I would also like for you to call [facility] to get established with one of their colorectal specialists for further evaluation and management of your anal fistula.  Return to the ER seek immediate medical attention should you experience any new or worsening symptoms.

## 2019-12-18 NOTE — Telephone Encounter (Signed)
I have not received any regards for Mr. Fawbush.  Yvonne Kendall, MD [facility]

## 2019-12-18 NOTE — Telephone Encounter (Signed)
I do not see anything for this patient in our inbox or filing system at this time but yesterday I did put records that came in into Dr. Serita Kyle inbox for review. I apologize but I do not recall the name of the patient now.

## 2019-12-18 NOTE — Telephone Encounter (Signed)
Dr End,  Do you recall seeing any [facility] records for this patient?

## 2020-01-16 NOTE — Progress Notes (Deleted)
Cardiology Office Note    Date:  01/16/2020   ID:  Curtis Harper, DOB Jun 09, 1971, MRN [id number]  PCP:  Marisue Ivan, MD  Cardiologist:  Yvonne Kendall, MD  Electrophysiologist:  None   Chief Complaint: ***  History of Present Illness:   Curtis Harper is a 49 y.o. male with history of ***  ***   Labs independently reviewed: ***  Past Medical History:  Diagnosis Date  . Anal fistula   . Complication of anesthesia    oxygen sats dropped  . Difficult intubation   . Diverticulosis of colon   . ED (erectile dysfunction)   . GERD (gastroesophageal reflux disease)   . Hemorrhoids   . History of rectal abscess    11-12-2015;  09-16-2016  . History of urinary retention 11/12/2015   large perirectal abscess  . Hyperlipidemia   . Hypertension   . Migraine   . OSA treated with BiPAP    last study done 09-16-2016  . Perirectal abscess     Past Surgical History:  Procedure Laterality Date  . CHOLECYSTECTOMY    . COLONOSCOPY WITH ESOPHAGOGASTRODUODENOSCOPY (EGD)  06/29/2016  . INCISION AND DRAINAGE ABSCESS N/A 11/12/2015   Procedure: INCISION AND DRAINAGE ABSCESS;  Surgeon: Leafy Ro, MD;  Location: [facility];  Service: General;  Laterality: N/A;  . INCISION AND DRAINAGE PERIRECTAL ABSCESS N/A 09/16/2016   Procedure: IRRIGATION AND DEBRIDEMENT PERIRECTAL ABSCESS;  Surgeon: Berna Bue, MD;  Location: [facility];  Service: General;  Laterality: N/A;  . INGUINAL HERNIA REPAIR Left 06/09/2016   Procedure: HERNIA REPAIR INGUINAL ADULT;  Surgeon: Henrene Dodge, MD;  Location: [facility];  Service: General;  Laterality: Left;  . LAPAROSCOPY  05/17/2016   Procedure: LAPAROSCOPY DIAGNOSTIC;  Surgeon: Henrene Dodge, MD;  Location: [facility];  Service: General;;  . LUMBAR SPINE SURGERY  1999   L5  . NECK FUSION  2013  . ROTATOR CUFF REPAIR Left   . TONSILLECTOMY AND ADENOIDECTOMY    . TRANSTHORACIC ECHOCARDIOGRAM  05/30/2016   ef 60-65%/  mild MR and PR/ mild LAE and  RAE/ trivial TR    Current Medications: No outpatient medications have been marked as taking for the 01/21/20 encounter (Appointment) with Sondra Barges, PA-C.    Allergies:   Lisinopril   Social History   Socioeconomic History  . Marital status: Married    Spouse name: Not on file  . Number of children: 4  . Years of education: Not on file  . Highest education level: Not on file  Occupational History  . Occupation: truck Hospital doctor  Tobacco Use  . Smoking status: Former Smoker    Packs/day: 0.50    Years: 10.00    Pack years: 5.00    Types: Cigarettes    Quit date: 05/11/1996    Years since quitting: 23.6  . Smokeless tobacco: Never Used  Substance and Sexual Activity  . Alcohol use: Yes    Alcohol/week: 3.0 standard drinks    Types: 1 Glasses of wine, 2 Cans of beer per week    Comment: occ  . Drug use: No  . Sexual activity: Yes    Partners: Female  Other Topics Concern  . Not on file  Social History Narrative   Truck driver for [employer]    Social Determinants of Health   Financial Resource Strain:   . Difficulty of Paying Living Expenses:   Food Insecurity:   . Worried About Programme researcher, broadcasting/film/video in the Last  Year:   . Ran Out of Food in the Last Year:   Transportation Needs:   . Freight forwarder (Medical):   Marland Kitchen Lack of Transportation (Non-Medical):   Physical Activity:   . Days of Exercise per Week:   . Minutes of Exercise per Session:   Stress:   . Feeling of Stress :   Social Connections:   . Frequency of Communication with Friends and Family:   . Frequency of Social Gatherings with Friends and Family:   . Attends Religious Services:   . Active Member of Clubs or Organizations:   . Attends Banker Meetings:   Marland Kitchen Marital Status:      Family History:  The patient's family history includes Hyperlipidemia in his mother; Hypertension in his mother; Prostate cancer in his maternal grandfather; Stomach cancer in his father. There is no history of  Colon cancer, Colon polyps, Esophageal cancer, or Rectal cancer.  ROS:   ROS   EKGs/Labs/Other Studies Reviewed:    Studies reviewed were summarized above. The additional studies were reviewed today: ***  EKG:  EKG is ordered today.  The EKG ordered today demonstrates ***  Recent Labs: 12/14/2019: ALT 25; BUN 9; Creatinine, Ser 0.86; Hemoglobin 13.5; Platelets 297; Potassium 3.4; Sodium 137  Recent Lipid Panel    Component Value Date/Time   CHOL 203 (H) 07/17/2017 0916   TRIG 112.0 07/17/2017 0916   HDL 52.00 07/17/2017 0916   CHOLHDL 4 07/17/2017 0916   VLDL 22.4 07/17/2017 0916   LDLCALC 129 (H) 07/17/2017 0916   LDLDIRECT 120.0 06/21/2016 0850    PHYSICAL EXAM:    VS:  There were no vitals taken for this visit.  BMI: There is no height or weight on file to calculate BMI.  Physical Exam  Wt Readings from Last 3 Encounters:  12/09/19 272 lb 6 oz (123.5 kg)  10/24/18 262 lb (118.8 kg)  07/17/17 273 lb (123.8 kg)     ASSESSMENT & PLAN:   1. ***  Disposition: F/u with Dr. Marland Kitchen or an APP in ***.   Medication Adjustments/Labs and Tests Ordered: Current medicines are reviewed at length with the patient today.  Concerns regarding medicines are outlined above. Medication changes, Labs and Tests ordered today are summarized above and listed in the Patient Instructions accessible in Encounters.   Signed, Eula Listen, PA-C 01/16/2020 12:59 PM     [facility] [address] [place], [place] [address] [phone]

## 2020-01-20 ENCOUNTER — Encounter: Admission: RE | Admit: 2020-01-20 | Payer: Federal, State, Local not specified - PPO | Source: Ambulatory Visit

## 2020-01-21 ENCOUNTER — Ambulatory Visit: Payer: Federal, State, Local not specified - PPO | Admitting: Physician Assistant

## 2020-01-31 ENCOUNTER — Ambulatory Visit: Payer: Federal, State, Local not specified - PPO | Admitting: Family

## 2020-02-21 ENCOUNTER — Other Ambulatory Visit: Payer: Self-pay | Admitting: Internal Medicine

## 2020-02-21 NOTE — Telephone Encounter (Signed)
Please reschedule 6 week F/U appointment. Thank you!

## 2020-02-21 NOTE — Telephone Encounter (Signed)
LVM for patient to call back. ?

## 2020-02-25 NOTE — Telephone Encounter (Signed)
Patient still out of town on the road and will call back when able to be seen .

## 2020-03-03 NOTE — Telephone Encounter (Signed)
See previous message

## 2020-03-03 NOTE — Telephone Encounter (Signed)
Scheduled for nuclear stress test 9/21 and fu with Dan Humphreys 9/24

## 2020-03-03 NOTE — Telephone Encounter (Signed)
LVM to call back.

## 2020-03-10 ENCOUNTER — Other Ambulatory Visit: Payer: Self-pay

## 2020-03-10 ENCOUNTER — Encounter
Admission: RE | Admit: 2020-03-10 | Discharge: 2020-03-10 | Disposition: A | Discharge status: Home / Self Care | Payer: Federal, State, Local not specified - PPO | Source: Ambulatory Visit | Attending: Internal Medicine | Admitting: Internal Medicine

## 2020-03-10 DIAGNOSIS — R0600 Dyspnea, unspecified: Secondary | ICD-10-CM | POA: Insufficient documentation

## 2020-03-10 DIAGNOSIS — R0609 Other forms of dyspnea: Secondary | ICD-10-CM

## 2020-03-10 LAB — NM MYOCAR MULTI W/SPECT W/WALL MOTION / EF
LV dias vol: 146 mL (ref 62–150)
LV sys vol: 56 mL
Peak HR: 82 {beats}/min
Percent HR: 48 %
Rest HR: 66 {beats}/min
TID: 1.03

## 2020-03-10 MED ORDER — TECHNETIUM TC 99M TETROFOSMIN IV KIT
30.0000 | PACK | Freq: Once | INTRAVENOUS | Status: AC | PRN
  Administered 2020-03-10: 30.664 via INTRAVENOUS

## 2020-03-10 MED ORDER — REGADENOSON 0.4 MG/5ML IV SOLN
0.4000 mg | Freq: Once | INTRAVENOUS | Status: AC
  Administered 2020-03-10: 0.4 mg via INTRAVENOUS

## 2020-03-10 MED ORDER — TECHNETIUM TC 99M TETROFOSMIN IV KIT
10.1100 | PACK | Freq: Once | INTRAVENOUS | Status: AC | PRN
  Administered 2020-03-10: 10.11 via INTRAVENOUS

## 2020-03-13 ENCOUNTER — Ambulatory Visit: Payer: Federal, State, Local not specified - PPO | Admitting: Family

## 2020-03-13 ENCOUNTER — Encounter: Payer: Self-pay | Admitting: Family

## 2020-03-13 ENCOUNTER — Other Ambulatory Visit: Payer: Self-pay

## 2020-03-13 VITALS — BP 120/62 | HR 72 | Ht 71.0 in | Wt 274.8 lb

## 2020-03-13 DIAGNOSIS — R0600 Dyspnea, unspecified: Secondary | ICD-10-CM | POA: Diagnosis not present

## 2020-03-13 DIAGNOSIS — I340 Nonrheumatic mitral (valve) insufficiency: Secondary | ICD-10-CM | POA: Diagnosis not present

## 2020-03-13 DIAGNOSIS — I10 Essential (primary) hypertension: Secondary | ICD-10-CM

## 2020-03-13 DIAGNOSIS — R0609 Other forms of dyspnea: Secondary | ICD-10-CM

## 2020-03-13 DIAGNOSIS — G4733 Obstructive sleep apnea (adult) (pediatric): Secondary | ICD-10-CM | POA: Diagnosis not present

## 2020-03-13 DIAGNOSIS — E785 Hyperlipidemia, unspecified: Secondary | ICD-10-CM

## 2020-03-13 NOTE — Patient Instructions (Signed)
Medication Instructions:  No medication changes today.   *If you need a refill on your cardiac medications before your next appointment, please call your pharmacy*   Lab Work: No lab work today.   Testing/Procedures: Your stress test showed no evidence of blockages which is a great result!  Your physician has requested that you have an echocardiogram. Echocardiography is a painless test that uses sound waves to create images of your heart. It provides your doctor with information about the size and shape of your heart and how well your heart's chambers and valves are working. This procedure takes approximately one hour. There are no restrictions for this procedure.  Follow-Up: At [facility], you and your health needs are our priority.  As part of our continuing mission to provide you with exceptional heart care, we have created designated Provider Care Teams.  These Care Teams include your primary Cardiologist (physician) and Advanced Practice Providers (APPs -  Physician Assistants and Nurse Practitioners) who all work together to provide you with the care you need, when you need it.  We recommend signing up for the patient portal called "MyChart".  Sign up information is provided on this After Visit Summary.  MyChart is used to connect with patients for Virtual Visits (Telemedicine).  Patients are able to view lab/test results, encounter notes, upcoming appointments, etc.  Non-urgent messages can be sent to your provider as well.   To learn more about what you can do with MyChart, go to [email or web address].    Your next appointment:   6 month(s)  The format for your next appointment:   In Person  Provider:   You may see Yvonne Kendall, MD or one of the following Advanced Practice Providers on your designated Care Team:   Nicolasa Ducking, NP  Eula Listen, PA-C  Gillian Shields, NP  Marisue Ivan, PA-C  Cadence Fransico Michael, [place]  Other Instructions  You have been  referred to pulmonology.

## 2020-03-13 NOTE — Progress Notes (Signed)
Office Visit    Patient Name: Curtis Harper Date of Encounter: 03/13/2020  Primary Care Provider:  Dion Body, MD Primary Cardiologist:  Nelva Bush, MD Electrophysiologist:  None   Chief Complaint    TRAFTON Harper is a 49 y.o. male with a hx of HTN, HLD, OSA, obesity, GERD presents today for follow up after Lexiscan myoview.    Past Medical History    Past Medical History:  Diagnosis Date  . Anal fistula   . Complication of anesthesia    oxygen sats dropped  . Difficult intubation   . Diverticulosis of colon   . ED (erectile dysfunction)   . GERD (gastroesophageal reflux disease)   . Hemorrhoids   . History of rectal abscess    11-12-2015;  09-16-2016  . History of urinary retention 11/12/2015   large perirectal abscess  . Hyperlipidemia   . Hypertension   . Migraine   . OSA treated with BiPAP    last study done 09-16-2016  . Perirectal abscess    Past Surgical History:  Procedure Laterality Date  . CHOLECYSTECTOMY    . COLONOSCOPY WITH ESOPHAGOGASTRODUODENOSCOPY (EGD)  06/29/2016  . INCISION AND DRAINAGE ABSCESS N/A 11/12/2015   Procedure: INCISION AND DRAINAGE ABSCESS;  Surgeon: Jules Husbands, MD;  Location: [facility];  Service: General;  Laterality: N/A;  . INCISION AND DRAINAGE PERIRECTAL ABSCESS N/A 09/16/2016   Procedure: IRRIGATION AND DEBRIDEMENT PERIRECTAL ABSCESS;  Surgeon: Clovis Riley, MD;  Location: [facility];  Service: General;  Laterality: N/A;  . INGUINAL HERNIA REPAIR Left 06/09/2016   Procedure: HERNIA REPAIR INGUINAL ADULT;  Surgeon: Olean Ree, MD;  Location: [facility];  Service: General;  Laterality: Left;  . LAPAROSCOPY  05/17/2016   Procedure: LAPAROSCOPY DIAGNOSTIC;  Surgeon: Olean Ree, MD;  Location: [facility];  Service: General;;  . LUMBAR SPINE SURGERY  1999   L5  . NECK FUSION  2013  . ROTATOR CUFF REPAIR Left   . TONSILLECTOMY AND ADENOIDECTOMY    . TRANSTHORACIC ECHOCARDIOGRAM  05/30/2016   ef 60-65%/  mild MR  and PR/ mild LAE and RAE/ trivial TR    Allergies  Allergies  Allergen Reactions  . Lisinopril Cough    History of Present Illness    Curtis Harper is a 49 y.o. male with a hx of HTN, HLD, OSA, obesity, GERD last seen 12/09/19 by Dr. Saunders Revel.  He was seen 10/2018 via virtual visit noting elevated heart rates and progressive exertional dyspnea. He deferred ischemia eval preferring medical therapy due to COVID19 pandemic and Metoprolol increased.  Seen in clinic 12/09/19 noting continued progressive exertional dyspnea. Also noted episodic palpitations. Subsequent Leane Call 03/10/20 was low risk study with EF 62%, no significant ischemia or scar, and no significant coronary artery calcification on attenuation correction CT.   Labs via Care Everywhere:  Lipid profile 12/10/19 total cholesterol 143, triglycerides 261, HDL 51.7, LDL 39.   12/10/19 K 3.6, creatinine 0.8, GFR 124, AST 24, ALT 33, Alk Phos 46, Hb 12.8, A1c 6.6  Reports today for follow up after Lexiscan. Reviewed low risk Lexiscan. Reports feeling overall well. Reports no shortness of breath at rest. Tells me his dyspnea on exertion is stable and only occurs with more than usual activity. Reports no chest pain, pressure, or tightness. No edema, orthopnea, PND. Reports no palpitations.  BP checked twice per week at home with average reading 125/78. 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 Endorses  eating heart healthy diet.    EKGs/Labs/Other Studies Reviewed:   The following studies were reviewed today:  Lexiscan Myoview   Normal pharmacologic myocardial perfusion stress test without significant ischemia or scar.  The left ventricular ejection fraction is normal (62% by Siemens calculation).  There is no significant coronary artery calcification on the attenuation correction CT.  This is a low risk study.   EKG:  No EKG today.   Recent Labs: 12/14/2019: ALT 25; BUN 9; Creatinine, Ser  0.86; Hemoglobin 13.5; Platelets 297; Potassium 3.4; Sodium 137  Recent Lipid Panel    Component Value Date/Time   CHOL 203 (H) 07/17/2017 0916   TRIG 112.0 07/17/2017 0916   HDL 52.00 07/17/2017 0916   CHOLHDL 4 07/17/2017 0916   VLDL 22.4 07/17/2017 0916   LDLCALC 129 (H) 07/17/2017 0916   LDLDIRECT 120.0 06/21/2016 0850    Home Medications   No outpatient medications have been marked as taking for the 03/13/20 encounter (Appointment) with Loel Dubonnet, NP.      Review of Systems      Review of Systems  Constitutional: Negative for chills, fever and malaise/fatigue.  Cardiovascular: Positive for dyspnea on exertion. Negative for chest pain, leg swelling, near-syncope, orthopnea, palpitations and syncope.  Respiratory: Negative for cough, shortness of breath and wheezing.   Gastrointestinal: Negative for nausea and vomiting.  Neurological: Negative for dizziness, light-headedness and weakness.   All other systems reviewed and are otherwise negative except as noted above.  Physical Exam    VS:  There were no vitals taken for this visit. , BMI There is no height or weight on file to calculate BMI. GEN: Well nourished, well developed, in no acute distress. HEENT: normal. Neck: Supple, no JVD, carotid bruits, or masses. Cardiac: RRR, no murmurs, rubs, or gallops. No clubbing, cyanosis, edema.  Radials/DP/PT 2+ and equal bilaterally.  Respiratory:  Respirations regular and unlabored, clear to auscultation bilaterally. GI: Soft, nontender, nondistended, BS + x 4. MS: No deformity or atrophy. Skin: Warm and dry, no rash. Neuro:  Strength and sensation are intact. Psych: Normal affect.  Assessment & Plan    1. DOE - Stress test with no evidence of ischemia and normal LVEF. CT attenuation images without coronary calcification. Plan for echocardiogram to evaluate for HF, valvular abnormality, or elevated PASP as contributory. Regular walking regimen encouraged as I anticipate  weight and deconditioning likely contributory to dyspnea on exertion.  Referred to pulmonology as not seen since 2018.   2. HTN - BP well controlled. Continue current antihypertensive regimen.   3. HLD, LDL goal <70 - Lipid profile 12/10/19 total cholesterol 143, triglycerides 261, HDL 51.7, LDL 39. Continue Atorvastatin 20mg  daily. Recommend optimization of A1c, reduction of carbohydrates/sweets as this will improve his triglycerides.   4. DM2 - Continue to follow with PCP.   5. OSA - Continued CPAP compliance encouraged.   Disposition: Follow up in 6 month(s) with Dr. Saunders Revel or APP   Loel Dubonnet, NP 03/13/2020, 8:19 AM

## 2020-03-19 DIAGNOSIS — Z8719 Personal history of other diseases of the digestive system: Secondary | ICD-10-CM | POA: Diagnosis not present

## 2020-03-19 DIAGNOSIS — K529 Noninfective gastroenteritis and colitis, unspecified: Secondary | ICD-10-CM | POA: Diagnosis not present

## 2020-03-19 DIAGNOSIS — R152 Fecal urgency: Secondary | ICD-10-CM | POA: Diagnosis not present

## 2020-04-14 ENCOUNTER — Other Ambulatory Visit: Payer: Federal, State, Local not specified - PPO

## 2020-06-17 DIAGNOSIS — R000 Tachycardia, unspecified: Secondary | ICD-10-CM | POA: Diagnosis not present

## 2020-06-17 DIAGNOSIS — J209 Acute bronchitis, unspecified: Secondary | ICD-10-CM | POA: Diagnosis not present

## 2020-06-17 DIAGNOSIS — J019 Acute sinusitis, unspecified: Secondary | ICD-10-CM | POA: Diagnosis not present

## 2020-06-17 DIAGNOSIS — B9689 Other specified bacterial agents as the cause of diseases classified elsewhere: Secondary | ICD-10-CM | POA: Diagnosis not present

## 2020-06-18 ENCOUNTER — Ambulatory Visit (INDEPENDENT_AMBULATORY_CARE_PROVIDER_SITE_OTHER): Payer: Federal, State, Local not specified - PPO

## 2020-06-18 ENCOUNTER — Other Ambulatory Visit: Payer: Self-pay

## 2020-06-18 DIAGNOSIS — I340 Nonrheumatic mitral (valve) insufficiency: Secondary | ICD-10-CM | POA: Diagnosis not present

## 2020-06-18 DIAGNOSIS — I10 Essential (primary) hypertension: Secondary | ICD-10-CM

## 2020-06-18 DIAGNOSIS — R0609 Other forms of dyspnea: Secondary | ICD-10-CM

## 2020-06-18 DIAGNOSIS — R0600 Dyspnea, unspecified: Secondary | ICD-10-CM

## 2020-06-18 LAB — EJECTION FRACTION PERCENTAGE
Left Ventricular Ejection Fraction High Value: 65 %
Left Ventricular Ejection Fraction: 60 %

## 2020-06-19 LAB — ECHOCARDIOGRAM COMPLETE
AR max vel: 4.82 cm2
AV Area VTI: 4.67 cm2
AV Area mean vel: 4.64 cm2
AV Mean grad: 3 mmHg
AV Peak grad: 5.8 mmHg
Ao pk vel: 1.2 m/s
Area-P 1/2: 4.01 cm2
Calc EF: 62.8 %
S' Lateral: 2.9 cm
Single Plane A2C EF: 63.1 %
Single Plane A4C EF: 62.2 %

## 2020-07-01 ENCOUNTER — Encounter: Payer: Self-pay | Admitting: Internal Medicine

## 2020-07-01 ENCOUNTER — Other Ambulatory Visit: Payer: Self-pay

## 2020-07-01 ENCOUNTER — Ambulatory Visit: Payer: Federal, State, Local not specified - PPO | Admitting: Internal Medicine

## 2020-07-01 VITALS — BP 124/80 | HR 68 | Ht 71.0 in | Wt 266.0 lb

## 2020-07-01 DIAGNOSIS — E785 Hyperlipidemia, unspecified: Secondary | ICD-10-CM | POA: Diagnosis not present

## 2020-07-01 DIAGNOSIS — I10 Essential (primary) hypertension: Secondary | ICD-10-CM | POA: Diagnosis not present

## 2020-07-01 DIAGNOSIS — I5032 Chronic diastolic (congestive) heart failure: Secondary | ICD-10-CM | POA: Diagnosis not present

## 2020-07-01 DIAGNOSIS — G4733 Obstructive sleep apnea (adult) (pediatric): Secondary | ICD-10-CM | POA: Diagnosis not present

## 2020-07-01 MED ORDER — METOPROLOL SUCCINATE ER 50 MG PO TB24: 25.0000 mg | ORAL_TABLET | Freq: Every day | ORAL | 2 refills | Status: DC

## 2020-07-01 NOTE — Progress Notes (Signed)
Follow-up Outpatient Visit Date: 07/01/2020  Primary Care Provider: Marisue Ivan, MD [phone] [facility] [address] [facility] [place] [address]  Chief Complaint: Follow-up shortness of breath  HPI:  Curtis Harper is a 50 y.o. male with history of hypertension, hyperlipidemia, obstructive sleep apnea, obesity, and GERD, who presents for follow-up of dyspnea on exertion.  He was last seen in our office in 02/2020 by Gillian Shields, NP.  At that time he was feeling well with stable exertional dyspnea.  He was referred for echocardiogram as well as pulmonary consultation.  TTE last month was notable for preserved LVEF with grade 2 diastolic dysfunction, normal RV size and function, and no significant valvular pathology.  Today, Curtis Harper reports that he has been experiencing more shortness of breath.  3 months ago, he developed cough and progressive dyspnea.  He was ultimately diagnosed with bronchitis and notes that his cough has improved some with antitussives and bronchodilators.  He feels like he has been more fatigued over the last few weeks and also has noticed his resting heart rate to be higher.  He denies chest pain, palpitations, and lightheadedness.  Dependent edema seems stable.  He continues to work as a Naval architect, though he notes that he is getting out of breath easily with tasks associated with his job.  He is scheduled for follow-up with pulmonology tomorrow  --------------------------------------------------------------------------------------------------  Cardiovascular History & Procedures: Cardiovascular Problems:  Intraoperative hypoxia  Dyspnea on exertion  Risk Factors:  Hypertension, hyperlipidemia, obesity, and male gender  Cath/PCI:  None  CV Surgery:  None  EP Procedures and Devices:  None  Non-Invasive Evaluation(s):  TTE (06/18/2020): Normal LV size and wall thickness.  LVEF 60-65% with grade 2 diastolic dysfunction.  Normal  RV size and function with borderline elevated pulmonary artery pressure.  No significant valvular abnormality.  Pharmacologic MPI (03/10/2020): Normal study without significant ischemia or scar.  LVEF 62%.  No coronary artery calcification.  Low risk study.  TTE with bubble study (05/30/16): Normal biventricular size and function (LVEF 60-65%). Mild mitral regurgitation and mild biatrial enlargement. Negative bubble study without evidence of atrial or pulmonary right to left shunt.  Recent CV Pertinent Labs: Lab Results  Component Value Date   CHOL 203 (H) 07/17/2017   HDL 52.00 07/17/2017   LDLCALC 129 (H) 07/17/2017   LDLDIRECT 120.0 06/21/2016   TRIG 112.0 07/17/2017   CHOLHDL 4 07/17/2017   K 3.4 (L) 12/14/2019   BUN 9 12/14/2019   BUN 11 01/18/2016   CREATININE 0.86 12/14/2019    Past medical and surgical history were reviewed and updated in EPIC.  Current Meds  Medication Sig  . albuterol (VENTOLIN HFA) 108 (90 Base) MCG/ACT inhaler as needed.  Marland Kitchen atorvastatin (LIPITOR) 20 MG tablet Take 1 tablet (20 mg total) by mouth daily at 6 PM.  . fluticasone (FLONASE) 50 MCG/ACT nasal spray Place into the nose daily.  . metFORMIN (GLUCOPHAGE) 500 MG tablet Take 1 tablet (500 mg total) by mouth 2 (two) times daily with a meal. (Patient taking differently: Take 500 mg by mouth daily with breakfast.)  . metoprolol succinate (TOPROL-XL) 50 MG 24 hr tablet TAKE 1 TABLET (50 MG TOTAL) BY MOUTH DAILY. TAKE WITH OR IMMEDIATELY FOLLOWING A MEAL.  . pantoprazole (PROTONIX) 40 MG tablet Take 1 tablet (40 mg total) by mouth every morning. 30 minutes before food  . sildenafil (REVATIO) 20 MG tablet Take 1-5 tablets (20-100 mg total) by mouth daily as needed.  Marland Kitchen  valsartan-hydrochlorothiazide (DIOVAN-HCT) 160-25 MG tablet Take 1 tablet by mouth daily.    Allergies: Lisinopril  Social History   Tobacco Use  . Smoking status: Former Smoker    Packs/day: 0.50    Years: 10.00    Pack years: 5.00     Types: Cigarettes    Quit date: 05/11/1996    Years since quitting: 24.1  . Smokeless tobacco: Never Used  Substance Use Topics  . Alcohol use: Yes    Alcohol/week: 3.0 standard drinks    Types: 1 Glasses of wine, 2 Cans of beer per week    Comment: occ  . Drug use: No    Family History  Problem Relation Age of Onset  . Stomach cancer Father        spread to lungs  . Hypertension Mother   . Hyperlipidemia Mother   . Prostate cancer Maternal Grandfather   . Colon cancer Neg Hx   . Colon polyps Neg Hx   . Esophageal cancer Neg Hx   . Rectal cancer Neg Hx     Review of Systems: A 12-system review of systems was performed and was negative except as noted in the HPI.  --------------------------------------------------------------------------------------------------  Physical Exam: BP 124/80 (BP Location: Left Arm, Patient Position: Sitting, Cuff Size: Normal)   Pulse 68   Ht 5\' 11"  (1.803 m)   Wt 266 lb (120.7 kg)   SpO2 97%   BMI 37.10 kg/m   General:  NAD. Neck: No JVD or HJR. Lungs: Clear to auscultation bilaterally without wheezes or crackles. Heart: Regular rate and rhythm without murmurs, rubs, or gallops. Abdomen: Soft, nontender, nondistended. Extremities: No significant edema in either lower extremity.  EKG: Normal sinus rhythm with left axis deviation.  Otherwise, no significant abnormality.  Heart rate has decreased from prior tracing on 12/09/2019.  Otherwise, there has been no significant interval change.  Lab Results  Component Value Date   WBC 6.9 12/14/2019   HGB 13.5 12/14/2019   HCT 39.8 12/14/2019   MCV 87.3 12/14/2019   PLT 297 12/14/2019    Lab Results  Component Value Date   NA 137 12/14/2019   K 3.4 (L) 12/14/2019   CL 101 12/14/2019   CO2 23 12/14/2019   BUN 9 12/14/2019   CREATININE 0.86 12/14/2019   GLUCOSE 121 (H) 12/14/2019   ALT 25 12/14/2019    Lab Results  Component Value Date   CHOL 203 (H) 07/17/2017   HDL 52.00  07/17/2017   LDLCALC 129 (H) 07/17/2017   LDLDIRECT 120.0 06/21/2016   TRIG 112.0 07/17/2017   CHOLHDL 4 07/17/2017    --------------------------------------------------------------------------------------------------  ASSESSMENT AND PLAN: Chronic HFpEF: Recent echo showed normal LVEF with grade 2 diastolic dysfunction which may be contributing somewhat to Curtis Harper shortness of breath, though he appears grossly euvolemic on examination today.  I suspect he also has an element of underlying lung disease, given recent bronchitis and symptomatic improvement with bronchodilators.  We discussed further evaluation to exclude underlying CAD such as cardiac CTA, though my suspicion for this is fairly low in light of normal myocardial perfusion stress test last year.  We will decrease metoprolol succinate to 25 mg daily to see if this offers some symptomatic improvement.  If not, I would have a low threshold for proceeding with cardiac CTA.  Hypertension: Blood pressure upper normal today.  We will decrease metoprolol succinate, as above.  If blood pressure trends up, we will need to consider escalation of valsartan versus addition of  an alternative agent such as amlodipine.  Hyperlipidemia: Continue atorvastatin with ongoing management per Dr. Burnadette Pop.  Obstructive sleep apnea: Continue CPAP use.  Morbid obesity: BMI remains greater than 35 with multiple comorbidities (diabetes mellitus, obstructive sleep apnea, and HFpEF).  Weight loss encouraged through diet and exercise.  Follow-up: Return to clinic in 1 month.  Curtis Kendall, MD 07/01/2020 10:56 AM

## 2020-07-01 NOTE — Patient Instructions (Signed)
Medication Instructions:  Your physician has recommended you make the following change in your medication:  1- DECREASE Metoprolol to 25 mg (0.5 tablet) by mouth once a day.  *If you need a refill on your cardiac medications before your next appointment, please call your pharmacy*  Follow-Up: At [facility], you and your health needs are our priority.  As part of our continuing mission to provide you with exceptional heart care, we have created designated Provider Care Teams.  These Care Teams include your primary Cardiologist (physician) and Advanced Practice Providers (APPs -  Physician Assistants and Nurse Practitioners) who all work together to provide you with the care you need, when you need it.  We recommend signing up for the patient portal called "MyChart".  Sign up information is provided on this After Visit Summary.  MyChart is used to connect with patients for Virtual Visits (Telemedicine).  Patients are able to view lab/test results, encounter notes, upcoming appointments, etc.  Non-urgent messages can be sent to your provider as well.   To learn more about what you can do with MyChart, go to [email or web address].    Your next appointment:   1 month(s)  The format for your next appointment:   In Person  Provider:   You may see Yvonne Kendall, MD or one of the following Advanced Practice Providers on your designated Care Team:    Nicolasa Ducking, NP  Eula Listen, PA-C  Marisue Ivan, PA-C  Cadence Conway, [place]  Gillian Shields, NP

## 2020-07-02 ENCOUNTER — Ambulatory Visit
Admission: RE | Admit: 2020-07-02 | Discharge: 2020-07-02 | Disposition: A | Discharge status: Home / Self Care | Payer: Federal, State, Local not specified - PPO | Source: Ambulatory Visit | Attending: Pulmonary Disease | Admitting: Pulmonary Disease

## 2020-07-02 ENCOUNTER — Encounter: Payer: Self-pay | Admitting: Pulmonary Disease

## 2020-07-02 ENCOUNTER — Other Ambulatory Visit
Admission: RE | Admit: 2020-07-02 | Discharge: 2020-07-02 | Disposition: A | Discharge status: Home / Self Care | Payer: Federal, State, Local not specified - PPO | Source: Ambulatory Visit | Attending: Pulmonary Disease | Admitting: Pulmonary Disease

## 2020-07-02 ENCOUNTER — Ambulatory Visit: Payer: Federal, State, Local not specified - PPO | Admitting: Pulmonary Disease

## 2020-07-02 VITALS — BP 140/80 | HR 96 | Temp 98.4°F | Ht 71.0 in | Wt 269.4 lb

## 2020-07-02 DIAGNOSIS — G4726 Circadian rhythm sleep disorder, shift work type: Secondary | ICD-10-CM

## 2020-07-02 DIAGNOSIS — R053 Chronic cough: Secondary | ICD-10-CM | POA: Insufficient documentation

## 2020-07-02 DIAGNOSIS — R059 Cough, unspecified: Secondary | ICD-10-CM | POA: Diagnosis not present

## 2020-07-02 DIAGNOSIS — G4733 Obstructive sleep apnea (adult) (pediatric): Secondary | ICD-10-CM | POA: Diagnosis not present

## 2020-07-02 LAB — CBC WITH DIFFERENTIAL/PLATELET
Abs Immature Granulocytes: 0.01 10*3/uL (ref 0.00–0.07)
Basophils Absolute: 0.1 10*3/uL (ref 0.0–0.1)
Basophils Relative: 1 %
Eosinophils Absolute: 0.4 10*3/uL (ref 0.0–0.5)
Eosinophils Relative: 6 %
HCT: 40.7 % (ref 39.0–52.0)
Hemoglobin: 13.6 g/dL (ref 13.0–17.0)
Immature Granulocytes: 0 %
Lymphocytes Relative: 31 %
Lymphs Abs: 1.9 10*3/uL (ref 0.7–4.0)
MCH: 29.1 pg (ref 26.0–34.0)
MCHC: 33.4 g/dL (ref 30.0–36.0)
MCV: 87.2 fL (ref 80.0–100.0)
Monocytes Absolute: 0.4 10*3/uL (ref 0.1–1.0)
Monocytes Relative: 7 %
Neutro Abs: 3.4 10*3/uL (ref 1.7–7.7)
Neutrophils Relative %: 55 %
Platelets: 327 10*3/uL (ref 150–400)
RBC: 4.67 MIL/uL (ref 4.22–5.81)
RDW: 12.3 % (ref 11.5–15.5)
WBC: 6.2 10*3/uL (ref 4.0–10.5)
nRBC: 0 % (ref 0.0–0.2)

## 2020-07-02 MED ORDER — FLOVENT HFA 110 MCG/ACT IN AERO: 2.0000 | INHALATION_SPRAY | Freq: Two times a day (BID) | RESPIRATORY_TRACT | 12 refills | Status: AC

## 2020-07-02 NOTE — Progress Notes (Signed)
Formatting of this note is different from the original.  Images from the original note were not included.      [facility]    Chief Complaint   Patient presents with   ? Consult     Hx of sleep apnea.  Wakes up after about 3 hours, not sleeping well.     Constitutional:   BP 140/80 (BP Location: Left Arm, Patient Position: Sitting, Cuff Size: Large)   Pulse 96   Temp 98.4 F (36.9 C) (Temporal)   Ht 5\' 11"  (1.803 m)   Wt 269 lb 6.4 oz (122.2 kg)   SpO2 96%   BMI 37.57 kg/m     Past Medical History:   Anal fistula, Diverticulosis, ED, GERD, HLD, HTN, Migraine HA, ACE inhibitor cough    Past Surgical History:   He  has a past surgical history that includes Rotator cuff repair (Left); Incision and drainage abscess (N/A, 11/12/2015); Tonsillectomy and adenoidectomy; NECK FUSION (2013); laparoscopy (05/17/2016); Inguinal hernia repair (Left, 06/09/2016); Incision and drainage perirectal abscess (N/A, 09/16/2016); Lumbar spine surgery (1999); Cholecystectomy; Colonoscopy with esophagogastroduodenoscopy (egd) (06/29/2016); and transthoracic echocardiogram (05/30/2016).    Brief Summary:   Jason Greene is a 50 y.o. male former smoker with obstructive sleep apnea, rotating shift work, and chronic cough.      Subjective:     Previously seen by Dr. 44.    He had several episodes of bronchitis toward the end of last year.  Was treated with ventolin and several courses of antibiotics and steroids.  Still has cough and gets winded.  Occasional wheezing.  Not having sinus congestion or post nasal drip.  Gets allergies in the Spring.  Quit smoking years ago.  Had pneumonia years ago.  Ventolin helps when he uses it.    He had sleep study in 2018.  He was using [employer] for supplies, but more recently has been purchasing supplies on his own.  He has rotating work schedule between days and nights.  When he works night shifts he tends to fall asleep in his chair, and then isn't  able to wear CPAP.  CPAP working well otherwise and no issues with mask fit.    Physical Exam:     Appearance - well kempt     ENMT - no sinus tenderness, no oral exudate, no LAN, Mallampati 3 airway, no stridor    Respiratory - equal breath sounds bilaterally, no wheezing or rales    CV - s1s2 regular rate and rhythm, no murmurs    Ext - no clubbing, no edema    Skin - no rashes    Psych - normal mood and affect    Pulmonary tests:     Chest imaging:     Sleep Tests:    PSG 09/14/16 >> AHI 18, SpO2 low 89%   Auto CPAP 05/31/20 to 06/29/20 >> used on 12 of 30 nights with average 5 hrs 15 min.  Average AHI 0.5 with median CPAP 10 and 95 th percentile CPAP 12 cm H2O    Cardiac Tests:    Echo 06/18/20 >> EF 60 to 65%, grade 2 DD    Social History:   He  reports that he quit smoking about 24 years ago. His smoking use included cigarettes. He has a 5.00 pack-year smoking history. He has never used smokeless tobacco. He reports current alcohol use of about 3.0 standard drinks of alcohol per week. He reports that he does not  use drugs.    Family History:   His family history includes Hyperlipidemia in his mother; Hypertension in his mother; Prostate cancer in his maternal grandfather; Stomach cancer in his father.      Assessment/Plan:     Chronic cough with history of asthma.  - has lingering symptoms of cough and dyspnea since was treated for bronchitis toward end of 2021  - will have him try flovent 100 mcg bid and continue prn ventolin  - will arrange for chest xray, pulmonary function test, CBC with differential, and IgE    Obstructive sleep apnea.  - he reports benefit from CPAP therapy  - will have him re-establish with [employer] for his DME  - discussed techniques to improve compliance with CPAP  - continue auto CPAP 10 to 15 cm H2O    Rotating shift work schedule.  - discussed techniques to try and improve his sleep quality    Chronic diastolic CHF.  - followed by Dr. Cristal Deer End with [facility]    Time  Spent Involved in Patient Care on Day of Examination:   48 minutes    Follow up:   Patient Instructions   Flovent two puffs in the morning and two puffs in the evening, and rinse your mouth after each use    Ventolin two puffs every 6 hours as needed for cough, wheeze, or chest congestion    Will arrange for chest xray, lab tests, and pulmonary function test    Will have [employer] arrange for new CPAP mask and supplies    Follow up in 6 weeks with Dr. Craige Cotta or Nurse Practitioner    Medication List:     Allergies as of 07/02/2020       Reactions    Lisinopril Cough       Medication List        Accurate as of July 02, 2020 11:44 AM. If you have any questions, ask your nurse or doctor.         albuterol 108 (90 Base) MCG/ACT inhaler  Commonly known as: VENTOLIN HFA  as needed.    atorvastatin 20 MG tablet  Commonly known as: LIPITOR  Take 1 tablet (20 mg total) by mouth daily at 6 PM.    Flovent HFA 110 MCG/ACT inhaler  Generic drug: fluticasone  Inhale 2 puffs into the lungs in the morning and at bedtime.  Started by: Coralyn Helling, MD    fluticasone 50 MCG/ACT nasal spray  Commonly known as: FLONASE  Place into the nose daily.    metFORMIN 500 MG tablet  Commonly known as: GLUCOPHAGE  Take 1 tablet (500 mg total) by mouth 2 (two) times daily with a meal.  What changed: when to take this    metoprolol succinate 50 MG 24 hr tablet  Commonly known as: TOPROL-XL  Take 0.5 tablets (25 mg total) by mouth daily. Take with or immediately following a meal.    pantoprazole 40 MG tablet  Commonly known as: PROTONIX  Take 1 tablet (40 mg total) by mouth every morning. 30 minutes before food    sildenafil 20 MG tablet  Commonly known as: REVATIO  Take 1-5 tablets (20-100 mg total) by mouth daily as needed.    valsartan-hydrochlorothiazide 160-25 MG tablet  Commonly known as: DIOVAN-HCT  Take 1 tablet by mouth daily.        Signature:   Coralyn Helling, MD  [facility] Pulmonary/Critical Care  Pager - [phone]  07/02/2020, 11:44  AM        Electronically signed by Coralyn Helling, MD at 07/02/2020 11:44 AM EST

## 2020-07-02 NOTE — Addendum Note (Signed)
Addended by: Lajoyce Lauber A on: 07/02/2020 11:54 AM     Modules accepted: Orders      Electronically signed by Rosaland Lao, CMA at 07/02/2020 11:54 AM EST

## 2020-07-02 NOTE — Addendum Note (Signed)
Addended by: Lajoyce Lauber A on: 07/02/2020 11:54 AM   Modules accepted: Orders

## 2020-07-02 NOTE — Patient Instructions (Signed)
Flovent two puffs in the morning and two puffs in the evening, and rinse your mouth after each use  Ventolin two puffs every 6 hours as needed for cough, wheeze, or chest congestion  Will arrange for chest xray, lab tests, and pulmonary function test  Will have [employer] arrange for new CPAP mask and supplies  Follow up in 6 weeks with Dr. Craige Cotta or Nurse Practitioner

## 2020-07-02 NOTE — Progress Notes (Signed)
[facility]  Chief Complaint  Patient presents with  . Consult    Hx of sleep apnea.  Wakes up after about 3 hours, not sleeping well.    Constitutional:  BP 140/80 (BP Location: Left Arm, Patient Position: Sitting, Cuff Size: Large)   Pulse 96   Temp 98.4 F (36.9 C) (Temporal)   Ht 5\' 11"  (1.803 m)   Wt 269 lb 6.4 oz (122.2 kg)   SpO2 96%   BMI 37.57 kg/m   Past Medical History:  Anal fistula, Diverticulosis, ED, GERD, HLD, HTN, Migraine HA, ACE inhibitor cough  Past Surgical History:  He  has a past surgical history that includes Rotator cuff repair (Left); Incision and drainage abscess (N/A, 11/12/2015); Tonsillectomy and adenoidectomy; NECK FUSION (2013); laparoscopy (05/17/2016); Inguinal hernia repair (Left, 06/09/2016); Incision and drainage perirectal abscess (N/A, 09/16/2016); Lumbar spine surgery (1999); Cholecystectomy; Colonoscopy with esophagogastroduodenoscopy (egd) (06/29/2016); and transthoracic echocardiogram (05/30/2016).  Brief Summary:  Curtis Harper is a 50 y.o. male former smoker with obstructive sleep apnea, rotating shift work, and chronic cough.      Subjective:   Previously seen by Dr. 44.  He had several episodes of bronchitis toward the end of last year.  Was treated with ventolin and several courses of antibiotics and steroids.  Still has cough and gets winded.  Occasional wheezing.  Not having sinus congestion or post nasal drip.  Gets allergies in the Spring.  Quit smoking years ago.  Had pneumonia years ago.  Ventolin helps when he uses it.  He had sleep study in 2018.  He was using [employer] for supplies, but more recently has been purchasing supplies on his own.  He has rotating work schedule between days and nights.  When he works night shifts he tends to fall asleep in his chair, and then isn't able to wear CPAP.  CPAP working well otherwise and no issues with mask fit.   Physical Exam:    Appearance - well kempt   ENMT - no sinus tenderness, no oral exudate, no LAN, Mallampati 3 airway, no stridor  Respiratory - equal breath sounds bilaterally, no wheezing or rales  CV - s1s2 regular rate and rhythm, no murmurs  Ext - no clubbing, no edema  Skin - no rashes  Psych - normal mood and affect   Pulmonary tests:    Chest imaging:    Sleep Tests:   PSG 09/14/16 >> AHI 18, SpO2 low 89%  Auto CPAP 05/31/20 to 06/29/20 >> used on 12 of 30 nights with average 5 hrs 15 min.  Average AHI 0.5 with median CPAP 10 and 95 th percentile CPAP 12 cm H2O  Cardiac Tests:   Echo 06/18/20 >> EF 60 to 65%, grade 2 DD  Social History:  He  reports that he quit smoking about 24 years ago. His smoking use included cigarettes. He has a 5.00 pack-year smoking history. He has never used smokeless tobacco. He reports current alcohol use of about 3.0 standard drinks of alcohol per week. He reports that he does not use drugs.  Family History:  His family history includes Hyperlipidemia in his mother; Hypertension in his mother; Prostate cancer in his maternal grandfather; Stomach cancer in his father.     Assessment/Plan:   Chronic cough with history of asthma. - has lingering symptoms of cough and dyspnea since was treated for bronchitis toward end of 2021 - will have him try flovent 100 mcg bid and continue  prn ventolin - will arrange for chest xray, pulmonary function test, CBC with differential, and IgE  Obstructive sleep apnea. - he reports benefit from CPAP therapy - will have him re-establish with [employer] for his DME - discussed techniques to improve compliance with CPAP - continue auto CPAP 10 to 15 cm H2O  Rotating shift work schedule. - discussed techniques to try and improve his sleep quality  Chronic diastolic CHF. - followed by Dr. Cristal Deer End with [facility]  Time Spent Involved in Patient Care on Day of Examination:  48 minutes  Follow up:   Patient Instructions  Flovent two puffs in the morning and two puffs in the evening, and rinse your mouth after each use  Ventolin two puffs every 6 hours as needed for cough, wheeze, or chest congestion  Will arrange for chest xray, lab tests, and pulmonary function test  Will have [employer] arrange for new CPAP mask and supplies  Follow up in 6 weeks with Dr. Craige Cotta or Nurse Practitioner   Medication List:   Allergies as of 07/02/2020      Reactions   Lisinopril Cough      Medication List       Accurate as of July 02, 2020 11:44 AM. If you have any questions, ask your nurse or doctor.        albuterol 108 (90 Base) MCG/ACT inhaler Commonly known as: VENTOLIN HFA as needed.   atorvastatin 20 MG tablet Commonly known as: LIPITOR Take 1 tablet (20 mg total) by mouth daily at 6 PM.   Flovent HFA 110 MCG/ACT inhaler Generic drug: fluticasone Inhale 2 puffs into the lungs in the morning and at bedtime. Started by: Coralyn Helling, MD   fluticasone 50 MCG/ACT nasal spray Commonly known as: FLONASE Place into the nose daily.   metFORMIN 500 MG tablet Commonly known as: GLUCOPHAGE Take 1 tablet (500 mg total) by mouth 2 (two) times daily with a meal. What changed: when to take this   metoprolol succinate 50 MG 24 hr tablet Commonly known as: TOPROL-XL Take 0.5 tablets (25 mg total) by mouth daily. Take with or immediately following a meal.   pantoprazole 40 MG tablet Commonly known as: PROTONIX Take 1 tablet (40 mg total) by mouth every morning. 30 minutes before food   sildenafil 20 MG tablet Commonly known as: REVATIO Take 1-5 tablets (20-100 mg total) by mouth daily as needed.   valsartan-hydrochlorothiazide 160-25 MG tablet Commonly known as: DIOVAN-HCT Take 1 tablet by mouth daily.       Signature:  Coralyn Helling, MD [facility] Pulmonary/Critical Care Pager - [phone] 07/02/2020, 11:44 AM

## 2020-07-03 ENCOUNTER — Telehealth: Payer: Self-pay | Admitting: Pulmonary Disease

## 2020-07-03 ENCOUNTER — Telehealth: Payer: Self-pay

## 2020-07-03 NOTE — Telephone Encounter (Signed)
Patient is aware of date/time of covid test prior to PFT.  

## 2020-07-03 NOTE — Telephone Encounter (Signed)
I spoke with Curtis Harper and he is aware that I will get his PFT rescheduled. I have left a message with Britta Mccreedy with scheduling to CXL PFT on 07/07/20

## 2020-07-06 ENCOUNTER — Institutional Professional Consult (permissible substitution): Payer: Federal, State, Local not specified - PPO | Admitting: Pulmonary Disease

## 2020-07-06 ENCOUNTER — Other Ambulatory Visit: Admission: RE | Admit: 2020-07-06 | Payer: Federal, State, Local not specified - PPO | Source: Ambulatory Visit

## 2020-07-07 ENCOUNTER — Ambulatory Visit: Payer: Federal, State, Local not specified - PPO

## 2020-07-07 NOTE — Telephone Encounter (Signed)
I now have his PFT scheduled for 1/18 CXL and spoke with Britta Mccreedy that his next Monday and Tuesday days off will be 01/31 and 07/21/20. She has made a note to schedule him on 07/21/2020

## 2020-07-08 LAB — IGE: IgE (Immunoglobulin E), Serum: 298 IU/mL (ref 6–495)

## 2020-07-08 NOTE — Telephone Encounter (Signed)
Pt calling to see if PFT has been r/s'd. Please advise.

## 2020-07-09 NOTE — Telephone Encounter (Signed)
I spoke with Curtis Harper and he is aware that we don't have PFT sche for February yet

## 2020-07-14 NOTE — Telephone Encounter (Signed)
I have spoke with Curtis Harper and he is aware that his Covid Test is scheduled on 07/20/20 and his PFT has been scheduled on 07/21/2020 @ 2:30pm, Instructions have been emailed to him

## 2020-07-16 ENCOUNTER — Telehealth: Payer: Self-pay | Admitting: Pulmonary Disease

## 2020-07-16 NOTE — Telephone Encounter (Signed)
Pt returning missed call. Informed of covid test and pft. Nothing further needed.//TM

## 2020-07-16 NOTE — Telephone Encounter (Signed)
Lm to remind patient for date/time of covid test prior to PFT.    [facility]

## 2020-07-20 ENCOUNTER — Other Ambulatory Visit: Payer: Self-pay

## 2020-07-20 ENCOUNTER — Other Ambulatory Visit
Admission: RE | Admit: 2020-07-20 | Discharge: 2020-07-20 | Disposition: A | Discharge status: Home / Self Care | Payer: Federal, State, Local not specified - PPO | Source: Ambulatory Visit | Attending: Pulmonary Disease | Admitting: Pulmonary Disease

## 2020-07-20 DIAGNOSIS — Z01812 Encounter for preprocedural laboratory examination: Secondary | ICD-10-CM | POA: Diagnosis not present

## 2020-07-20 DIAGNOSIS — Z20822 Contact with and (suspected) exposure to covid-19: Secondary | ICD-10-CM | POA: Diagnosis not present

## 2020-07-20 LAB — SARS CORONAVIRUS 2 (TAT 6-24 HRS): SARS Coronavirus 2: NEGATIVE

## 2020-07-21 ENCOUNTER — Ambulatory Visit: Payer: Federal, State, Local not specified - PPO | Attending: Pulmonary Disease

## 2020-07-21 DIAGNOSIS — Z87891 Personal history of nicotine dependence: Secondary | ICD-10-CM | POA: Insufficient documentation

## 2020-07-21 DIAGNOSIS — G4730 Sleep apnea, unspecified: Secondary | ICD-10-CM | POA: Insufficient documentation

## 2020-07-21 DIAGNOSIS — R0600 Dyspnea, unspecified: Secondary | ICD-10-CM | POA: Insufficient documentation

## 2020-07-21 DIAGNOSIS — R053 Chronic cough: Secondary | ICD-10-CM

## 2020-07-21 LAB — PULMONARY FUNCTION TEST ARMC ONLY
DL/VA % pred: 124 %
DL/VA: 5.47 ml/min/mmHg/L
DLCO unc % pred: 84 %
DLCO unc: 25.44 ml/min/mmHg
FEF 25-75 Post: 2.77 L/sec
FEF 25-75 Pre: 3.46 L/sec
FEF2575-%Change-Post: -20 %
FEF2575-%Pred-Post: 81 %
FEF2575-%Pred-Pre: 102 %
FEV1-%Change-Post: -3 %
FEV1-%Pred-Post: 87 %
FEV1-%Pred-Pre: 90 %
FEV1-Post: 3.04 L
FEV1-Pre: 3.13 L
FEV1FVC-%Change-Post: -6 %
FEV1FVC-%Pred-Pre: 103 %
FEV6-%Change-Post: 3 %
FEV6-%Pred-Post: 93 %
FEV6-%Pred-Pre: 89 %
FEV6-Post: 3.94 L
FEV6-Pre: 3.79 L
FEV6FVC-%Change-Post: 0 %
FEV6FVC-%Pred-Post: 102 %
FEV6FVC-%Pred-Pre: 102 %
FVC-%Change-Post: 3 %
FVC-%Pred-Post: 90 %
FVC-%Pred-Pre: 87 %
FVC-Post: 3.94 L
FVC-Pre: 3.79 L
Post FEV1/FVC ratio: 77 %
Post FEV6/FVC ratio: 100 %
Pre FEV1/FVC ratio: 83 %
Pre FEV6/FVC Ratio: 100 %
RV % pred: 77 %
RV: 1.64 L
TLC % pred: 78 %
TLC: 5.6 L

## 2020-07-21 MED ORDER — ALBUTEROL SULFATE (2.5 MG/3ML) 0.083% IN NEBU
2.5000 mg | INHALATION_SOLUTION | Freq: Once | RESPIRATORY_TRACT | Status: AC
  Administered 2020-07-21: 2.5 mg via RESPIRATORY_TRACT
  Filled 2020-07-21: qty 3

## 2020-08-13 ENCOUNTER — Ambulatory Visit: Payer: Federal, State, Local not specified - PPO | Admitting: Pulmonary Disease

## 2020-08-13 ENCOUNTER — Ambulatory Visit: Payer: Federal, State, Local not specified - PPO | Admitting: Internal Medicine

## 2020-08-19 ENCOUNTER — Ambulatory Visit: Payer: Federal, State, Local not specified - PPO | Admitting: Family

## 2020-09-02 ENCOUNTER — Other Ambulatory Visit: Payer: Federal, State, Local not specified - PPO

## 2020-09-02 ENCOUNTER — Ambulatory Visit: Payer: Federal, State, Local not specified - PPO | Admitting: Internal Medicine

## 2020-10-14 DIAGNOSIS — I10 Essential (primary) hypertension: Secondary | ICD-10-CM | POA: Diagnosis not present

## 2020-10-14 DIAGNOSIS — G4733 Obstructive sleep apnea (adult) (pediatric): Secondary | ICD-10-CM | POA: Diagnosis not present

## 2020-11-22 ENCOUNTER — Other Ambulatory Visit: Payer: Self-pay | Admitting: Internal Medicine

## 2020-11-27 ENCOUNTER — Other Ambulatory Visit: Payer: Self-pay | Admitting: Internal Medicine

## 2020-11-30 NOTE — Telephone Encounter (Signed)
Attempted to schedule.  LMOV to call office.  ° °

## 2020-12-09 NOTE — Telephone Encounter (Signed)
Attempted to schedule.  

## 2020-12-16 NOTE — Telephone Encounter (Signed)
Attempted to schedule, lvm and sending letter to patient

## 2021-01-12 DIAGNOSIS — G4733 Obstructive sleep apnea (adult) (pediatric): Secondary | ICD-10-CM | POA: Diagnosis not present

## 2021-01-12 DIAGNOSIS — I10 Essential (primary) hypertension: Secondary | ICD-10-CM | POA: Diagnosis not present

## 2021-04-12 DIAGNOSIS — I10 Essential (primary) hypertension: Secondary | ICD-10-CM | POA: Diagnosis not present

## 2021-04-12 DIAGNOSIS — G4733 Obstructive sleep apnea (adult) (pediatric): Secondary | ICD-10-CM | POA: Diagnosis not present

## 2021-07-12 DIAGNOSIS — I10 Essential (primary) hypertension: Secondary | ICD-10-CM | POA: Diagnosis not present

## 2021-07-12 DIAGNOSIS — G4733 Obstructive sleep apnea (adult) (pediatric): Secondary | ICD-10-CM | POA: Diagnosis not present

## 2021-09-13 ENCOUNTER — Inpatient Hospital Stay
Admit: 2021-09-13 | Discharge: 2021-09-13 | Disposition: A | Discharge status: Home / Self Care | Payer: BLUE CROSS/BLUE SHIELD | Attending: Emergency Medicine

## 2021-09-13 ENCOUNTER — Emergency Department: Admit: 2021-09-13 | Payer: BLUE CROSS/BLUE SHIELD

## 2021-09-13 DIAGNOSIS — R109 Unspecified abdominal pain: Secondary | ICD-10-CM

## 2021-09-13 DIAGNOSIS — K4090 Unilateral inguinal hernia, without obstruction or gangrene, not specified as recurrent: Secondary | ICD-10-CM

## 2021-09-13 LAB — CBC WITH AUTOMATED DIFF
ABS. BASOPHILS: 0.1 10*3/uL (ref 0.0–0.1)
ABS. EOSINOPHILS: 0.5 10*3/uL — ABNORMAL HIGH (ref 0.0–0.4)
ABS. IMM. GRANS.: 0 10*3/uL (ref 0.00–0.04)
ABS. LYMPHOCYTES: 1.8 10*3/uL (ref 0.8–3.5)
ABS. MONOCYTES: 0.4 10*3/uL (ref 0.0–1.0)
ABS. NEUTROPHILS: 2.3 10*3/uL (ref 1.8–8.0)
ABSOLUTE NRBC: 0 10*3/uL (ref 0.00–0.01)
BASOPHILS: 1 % (ref 0–1)
EOSINOPHILS: 10 % — ABNORMAL HIGH (ref 0–7)
HCT: 43.4 % (ref 36.6–50.3)
HGB: 14.5 g/dL (ref 12.1–17.0)
IMMATURE GRANULOCYTES: 0 % (ref 0.0–0.5)
LYMPHOCYTES: 36 % (ref 12–49)
MCH: 29.6 PG (ref 26.0–34.0)
MCHC: 33.4 g/dL (ref 30.0–36.5)
MCV: 88.6 FL (ref 80.0–99.0)
MONOCYTES: 7 % (ref 5–13)
MPV: 9.7 FL (ref 8.9–12.9)
NEUTROPHILS: 46 % (ref 32–75)
NRBC: 0 PER 100 WBC
PLATELET: 325 10*3/uL (ref 150–400)
RBC: 4.9 M/uL (ref 4.10–5.70)
RDW: 12 % (ref 11.5–14.5)
WBC: 5.1 10*3/uL (ref 4.1–11.1)

## 2021-09-13 LAB — URINALYSIS W/ REFLEX CULTURE
BACTERIA, URINE: NEGATIVE /hpf
Bacteria: NEGATIVE /hpf
Bilirubin, Urine: NEGATIVE
Bilirubin: NEGATIVE
Glucose, Ur: NEGATIVE mg/dL
Glucose: NEGATIVE mg/dL
Ketone: NEGATIVE mg/dL
Ketones, Urine: NEGATIVE mg/dL
Leukocyte Esterase, Urine: NEGATIVE
Leukocyte Esterase: NEGATIVE
Nitrite, Urine: NEGATIVE
Nitrites: NEGATIVE
Protein, UA: NEGATIVE mg/dL
Protein: NEGATIVE mg/dL
Specific Gravity, UA: 1.012
Specific gravity: 1.012
Urobilinogen, UA, POCT: 0.2 EU/dL (ref 0.2–1.0)
Urobilinogen: 0.2 EU/dL (ref 0.2–1.0)
pH (UA): 5.5 (ref 5.0–8.0)
pH, UA: 5.5 (ref 5.0–8.0)

## 2021-09-13 LAB — METABOLIC PANEL, COMPREHENSIVE
A-G Ratio: 1.1 (ref 1.1–2.2)
ALT (SGPT): 25 U/L (ref 12–78)
AST (SGOT): 16 U/L (ref 15–37)
Albumin: 4.4 g/dL (ref 3.5–5.0)
Alk. phosphatase: 61 U/L (ref 45–117)
Anion gap: 2 mmol/L — ABNORMAL LOW (ref 5–15)
BUN/Creatinine ratio: 14 (ref 12–20)
BUN: 11 MG/DL (ref 6–20)
Bilirubin, total: 0.5 MG/DL (ref 0.2–1.0)
CO2: 33 mmol/L — ABNORMAL HIGH (ref 21–32)
Calcium: 9.6 MG/DL (ref 8.5–10.1)
Chloride: 102 mmol/L (ref 97–108)
Creatinine: 0.81 MG/DL (ref 0.70–1.30)
Globulin: 4 g/dL (ref 2.0–4.0)
Glucose: 108 mg/dL — ABNORMAL HIGH (ref 65–100)
Potassium: 3.6 mmol/L (ref 3.5–5.1)
Protein, total: 8.4 g/dL — ABNORMAL HIGH (ref 6.4–8.2)
Sodium: 137 mmol/L (ref 136–145)
eGFR: >60 mL/min/{1.73_m2} (ref >60)

## 2021-09-13 LAB — NT-PRO BNP: NT pro-BNP: 30 PG/ML (ref <125)

## 2021-09-13 LAB — SAMPLES BEING HELD

## 2021-09-13 LAB — TROPONIN-HIGH SENSITIVITY: Troponin-High Sensitivity: <4 ng/L (ref 0–76)

## 2021-09-13 LAB — OCCULT BLOOD, STOOL: Occult blood, stool: NEGATIVE

## 2021-09-13 LAB — COMPREHENSIVE METABOLIC PANEL
ALT: 25 U/L (ref 12–78)
AST: 16 U/L (ref 15–37)
Albumin/Globulin Ratio: 1.1 (ref 1.1–2.2)
Albumin: 4.4 g/dL (ref 3.5–5.0)
Alkaline Phosphatase: 61 U/L (ref 45–117)
Anion Gap: 2 mmol/L — ABNORMAL LOW (ref 5–15)
BUN/Creatinine Ratio: 14 (ref 12–20)
BUN: 11 MG/DL (ref 6–20)
CO2: 33 mmol/L — ABNORMAL HIGH (ref 21–32)
Calcium: 9.6 MG/DL (ref 8.5–10.1)
Chloride: 102 mmol/L (ref 97–108)
Creatinine: 0.81 MG/DL (ref 0.70–1.30)
Est, Glom Filt Rate: >60 mL/min/{1.73_m2} (ref >60)
Globulin: 4 g/dL (ref 2.0–4.0)
Glucose: 108 mg/dL — ABNORMAL HIGH (ref 65–100)
Potassium: 3.6 mmol/L (ref 3.5–5.1)
Sodium: 137 mmol/L (ref 136–145)
Total Bilirubin: 0.5 MG/DL (ref 0.2–1.0)
Total Protein: 8.4 g/dL — ABNORMAL HIGH (ref 6.4–8.2)

## 2021-09-13 LAB — CBC WITH AUTO DIFFERENTIAL
Basophils %: 1 % (ref 0–1)
Basophils Absolute: 0.1 10*3/uL (ref 0.0–0.1)
Eosinophils %: 10 % — ABNORMAL HIGH (ref 0–7)
Eosinophils Absolute: 0.5 10*3/uL — ABNORMAL HIGH (ref 0.0–0.4)
Granulocyte Absolute Count: 0 10*3/uL (ref 0.00–0.04)
Hematocrit: 43.4 % (ref 36.6–50.3)
Hemoglobin: 14.5 g/dL (ref 12.1–17.0)
Immature Granulocytes %: 0 % (ref 0.0–0.5)
Lymphocytes %: 36 % (ref 12–49)
Lymphocytes Absolute: 1.8 10*3/uL (ref 0.8–3.5)
MCH: 29.6 PG (ref 26.0–34.0)
MCHC: 33.4 g/dL (ref 30.0–36.5)
MCV: 88.6 FL (ref 80.0–99.0)
MPV: 9.7 FL (ref 8.9–12.9)
Monocytes %: 7 % (ref 5–13)
Monocytes Absolute: 0.4 10*3/uL (ref 0.0–1.0)
NRBC Absolute: 0 10*3/uL (ref 0.00–0.01)
Neutrophils %: 46 % (ref 32–75)
Neutrophils Absolute: 2.3 10*3/uL (ref 1.8–8.0)
Nucleated RBCs: 0 PER 100 WBC
Platelets: 325 10*3/uL (ref 150–400)
RBC: 4.9 M/uL (ref 4.10–5.70)
RDW: 12 % (ref 11.5–14.5)
WBC: 5.1 10*3/uL (ref 4.1–11.1)

## 2021-09-13 LAB — TROPONIN, HIGH SENSITIVITY: Troponin, High Sensitivity: <4 ng/L (ref 0–76)

## 2021-09-13 LAB — OCCULT BLOOD, FECAL: Occult Blood Fecal: NEGATIVE

## 2021-09-13 LAB — PROBNP, N-TERMINAL: BNP: 30 PG/ML (ref <125)

## 2021-09-13 MED ORDER — FAMOTIDINE 20 MG TAB
20 mg | ORAL_TABLET | Freq: Two times a day (BID) | ORAL | 0 refills | Status: AC
Start: 2021-09-13 — End: ?

## 2021-09-13 MED ORDER — FAMOTIDINE (PF) 20 MG/2 ML IV
20 mg/2 mL | INTRAVENOUS | Status: AC
Start: 2021-09-13 — End: 2021-09-13
  Administered 2021-09-13: 15:00:00 via INTRAVENOUS

## 2021-09-13 MED ORDER — DICYCLOMINE 20 MG TAB
20 mg | ORAL_TABLET | Freq: Four times a day (QID) | ORAL | 0 refills | Status: AC | PRN
Start: 2021-09-13 — End: ?

## 2021-09-13 MED ORDER — IOMEPROL 71.44 % IV SOLN
350 mg iodine /mL (71.44 %) | Freq: Once | INTRAVENOUS | Status: AC
Start: 2021-09-13 — End: 2021-09-13
  Administered 2021-09-13: 16:00:00 via INTRAVENOUS

## 2021-09-13 MED FILL — IOMEPROL 71.44 % IV SOLN: 350 mg iodine /mL (71.44 %) | INTRAVENOUS | Qty: 200

## 2021-09-13 MED FILL — FAMOTIDINE (PF) 20 MG/2 ML IV: 20 mg/2 mL | INTRAVENOUS | Qty: 2

## 2021-09-13 NOTE — ED Notes (Signed)
 Pt has been having abdomen pain urq radiating to back stabbing shooting pain, for a week pt has been noticing yellow light red color on his toilet paper

## 2021-09-14 LAB — EKG, 12 LEAD, INITIAL
Atrial Rate: 68 {beats}/min
Calculated P Axis: 53 degrees
Calculated R Axis: -36 degrees
Calculated T Axis: -2 degrees
Diagnosis: NORMAL
P-R Interval: 168 ms
Q-T Interval: 390 ms
QRS Duration: 96 ms
QTC Calculation (Bezet): 414 ms
Ventricular Rate: 68 {beats}/min

## 2021-09-14 LAB — EKG 12-LEAD
Atrial Rate: 68 {beats}/min
Diagnosis: NORMAL
P Axis: 53 degrees
P-R Interval: 168 ms
Q-T Interval: 390 ms
QRS Duration: 96 ms
QTc Calculation (Bazett): 414 ms
R Axis: -36 degrees
T Axis: -2 degrees
Ventricular Rate: 68 {beats}/min

## 2021-09-28 ENCOUNTER — Encounter: Attending: Family Medicine

## 2021-10-11 DIAGNOSIS — I10 Essential (primary) hypertension: Secondary | ICD-10-CM | POA: Diagnosis not present

## 2021-10-11 DIAGNOSIS — G4733 Obstructive sleep apnea (adult) (pediatric): Secondary | ICD-10-CM | POA: Diagnosis not present

## 2021-10-29 ENCOUNTER — Ambulatory Visit: Attending: Family Medicine

## 2021-10-29 ENCOUNTER — Ambulatory Visit
Admit: 2021-10-29 | Discharge: 2021-10-29 | Payer: BLUE CROSS/BLUE SHIELD | Attending: Family Medicine | Primary: Family Medicine

## 2021-10-29 DIAGNOSIS — E119 Type 2 diabetes mellitus without complications: Secondary | ICD-10-CM

## 2021-10-29 LAB — AMB POC HEMOGLOBIN A1C: Hemoglobin A1C, POC: 5.9 %

## 2021-10-29 MED ORDER — BENZONATATE 200 MG PO CAPS
200 MG | ORAL_CAPSULE | Freq: Three times a day (TID) | ORAL | 0 refills | Status: AC | PRN
Start: 2021-10-29 — End: 2021-11-08

## 2021-10-29 MED ORDER — ALBUTEROL SULFATE HFA 108 (90 BASE) MCG/ACT IN AERS
10890 (90 Base) MCG/ACT | Freq: Four times a day (QID) | RESPIRATORY_TRACT | 0 refills | Status: DC | PRN
Start: 2021-10-29 — End: 2022-06-02

## 2021-10-29 NOTE — Progress Notes (Signed)
[facility]  [phone]  [place], [place] [address]  [phone]    C/C: Diabetes and Abdominal Pain. Establish care    HPI:    Jason Greene is a 51 y.o.  Black / [place] American  male who presents to clinic today for evaluation of the issues listed above. Pt is new to the practice.    Previous PCP: Dr. Erenest Blank    Subjective;    Patient presenting for initial office visit with me today.      Diabetes:  Diagnosed with borderline diabetes (A1C 6.6%) in 11/2019.  On metformin 500mg  daily.    RUQ pain:  Ongoing for about 3 months.  Went to the ER and had CT abd/pelv which showed no acute findings per report.  Pt is s/p cholecystectomy.  No association with food.  No blood in stool.  Stools tend to be loose however.  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  Had colonoscopy years ago but not sure of results.    OSA on CPAP:  Requesting referral to sleep medicine as machine does not seem to work anymore per patient.    Cough:  Had covid about a month ago but still has ongoing cough.    Pt denies any  fever, chill, chest pain, SOB, abdominal pain, n/v/d, HA or dizziness.       Allergies- reviewed:   Allergies   Allergen Reactions    Lisinopril Cough       Past Medical History- reviewed:  Past Medical History:   Diagnosis Date    Diabetes (HCC)     High cholesterol     Hypertension        Family History - reviewed:  Family History   Problem Relation Age of Onset    Hypertension Mother     Cancer Father     No Known Problems Son     No Known Problems Son     No Known Problems Daughter     No Known Problems Daughter        Social History - reviewed:  Social History     Socioeconomic History    Marital status: Married     Spouse name: Not on file    Number of children: 4    Years of education: 12    Highest education level: 12th grade   Occupational History    Not on file   Tobacco Use    Smoking status: Former     Packs/day: 0.50     Years:  10.00     Pack years: 5.00     Types: Cigarettes     Quit date: 06/20/2002     Years since quitting: 19.3     Passive exposure: Past    Smokeless tobacco: Never   Substance and Sexual Activity    Alcohol use: Yes    Drug use: Never    Sexual activity: Yes     Partners: Female   Other Topics Concern    Not on file   Social History Narrative    Not on file     Social Determinants of Health     Financial Resource Strain: Low Risk     Difficulty of Paying Living Expenses: Not hard at all   Food Insecurity: No Food Insecurity    Worried About 08/19/2002 of Food in the Last Year: Never true    Ran Out of Food in the  Last Year: Never true   Transportation Needs: No Transportation Needs    Lack of Transportation (Medical): No    Lack of Transportation (Non-Medical): No   Physical Activity: Insufficiently Active    Days of Exercise per Week: 2 days    Minutes of Exercise per Session: 40 min   Stress: No Stress Concern Present    Feeling of Stress : Not at all   Social Connections: Socially Isolated    Frequency of Communication with Friends and Family: Three times a week    Frequency of Social Gatherings with Friends and Family: Three times a week    Attends Religious Services: Never    Active Member of Clubs or Organizations: No    Attends Banker Meetings: Never    Marital Status: Separated   Intimate Partner Violence: Not At Risk    Fear of Current or Ex-Partner: No    Emotionally Abused: No    Physically Abused: No    Sexually Abused: No   Housing Stability: Low Risk     Unable to Pay for Housing in the Last Year: No    Number of Places Lived in the Last Year: 0    Unstable Housing in the Last Year: No       Depression screening:  No flowsheet data found.      Review of systems:     A comprehensive review of systems was negative except for that written in the History of Present Illness.       BP 139/87 (Site: Left Upper Arm, Position: Sitting, Cuff Size: Large Adult)   Pulse 73   Temp 97.8 F (36.6 C)  (Oral)   Resp 17   Ht 5\' 9"  (1.753 m)   Wt 260 lb (117.9 kg)   SpO2 95%   BMI 38.40 kg/m     General: Alert and oriented, in no acute distress.   EYE: PERRL. Sclera and conjuctival clear. Extraocular movements intact.  EARS: External normal, canals clear, tympanic membranes normal.   NOSE: Mucosa healthy without drainage or ulceration.  OROPHARYNX: No suspicious lesions, normal dentition, pharynx, tongue and tonsils normal.  NECK: Supple; no masses; thyroid normal.  LUNGS: Respirations unlabored; clear to auscultation bilaterally.  CARDIOVASCULAR: Regular, rate, and rhythm without murmurs, gallops or rubs.  ABDOMEN: Right upper quadrant tenderness to deep palpation. normoactive bowel sounds; no masses or organomegaly.  MUSCULOSKELETAL: FROM in all extremities     EXT: No edema. Neurovascularlly intact  SKIN: No rash. No suspicious lesions or moles.  Neuro: Mental Status: Pt is alert and oriented to person, place, and time.      Assessment/Plan      Diagnosis Orders   1. Type II diabetes mellitus, well controlled (HCC)  AMB POC HEMOGLOBIN A1C      2. RUQ abdominal pain  [facility] - , MD, Gastroenterology, [place]      3. OSA (obstructive sleep apnea)  [facility] - Lorine Bears, MD, Sleep Medicine, [place]      4. COVID-19 virus infection  benzonatate (TESSALON) 200 MG capsule    albuterol sulfate HFA (VENTOLIN HFA) 108 (90 Base) MCG/ACT inhaler          1. Type II diabetes mellitus, well controlled (HCC)  Lab Results   Component Value Date/Time    HBA1CPOC 5.9 10/29/2021 11:00 AM       - Encouraged to work on lifestyle modifications including diet and exercise.  - Continue home monitoring. Glucose goals; Fasting (80-130), preprandial (<140),  2-hr post-prandial (<180)    - continue Metformin 500mg  daily    2. RUQ abdominal pain  - [facility] - Lorine BearsSeamon, Alex, MD, Gastroenterology, [place]    3. OSA (obstructive sleep apnea)  - [facility] - Johnsie CancelBashir, Naim, MD, Sleep Medicine, [place]    4. COVID-19  virus infection  - benzonatate (TESSALON) 200 MG capsule; Take 1 capsule by mouth 3 times daily as needed for Cough  Dispense: 30 capsule; Refill: 0  - albuterol sulfate HFA (VENTOLIN HFA) 108 (90 Base) MCG/ACT inhaler; Inhale 2 puffs into the lungs 4 times daily as needed for Wheezing        Follow up: 3 months or sooner if needed  On this date 10/29/21 I have spent 35 minutes reviewing previous notes, test results and face to face with the patient discussing the diagnosis and importance of compliance with the treatment plan as well as documenting on the day of the visit.     I have discussed the diagnosis with the patient and the intended plan as seen in the above orders.  The patient has received an after-visit summary and questions were answered concerning future plans.  I have discussed medication side effects and warnings with the patient as well. Informed patient to return to the office if new symptoms arise.    Signed By: Rene PaciAndre Georges Ishia Tenorio, MD     Oct 29, 2021

## 2021-10-29 NOTE — Patient Instructions (Signed)
[facility]    Dr. Cecilia Santos and Dr. Bashir Naim (also does pediatric sleep medicine)    [address], [place] [address]  [phone]   [address]   [place] [address]   Tel.  [phone]   Fax. [phone]  [address]   [place] [address]   Tel.  [phone]   Fax. [phone]  [address] [place] [address]   Tel.  [phone]   Fax. [phone]          [facility]  (also does home sleep test)    Dr. BERGH AND DR. PRICE  KASEY MCFARLING, PA  or ELIZABETH HENSLEY, PA (SLEEP MEDICINE)  Dr. Daniel Smith    [phone]

## 2021-10-29 NOTE — Progress Notes (Signed)
Name and Date of Birth Verified    Previous PCP: Dr. Erenest Blank    Pharmacy Verified    Chief Complaint   Patient presents with    New Patient    Established New Doctor     Patient fasting for labs, [facility].    Would like to consult on CT scan results form 09/13/2021. Still having abdominal pain.    1. "Have you been to the ER, urgent care clinic since your last visit?  Hospitalized since your last visit?"     Yes  [facility]  09/13/2021  Abdominal Pain    2. "Have you seen or consulted any other health care providers outside of the [facility] since your last visit?" No     3. For patients aged 53-75: Has the patient had a colonoscopy / FIT/ Cologuard? Yes Fit Test 09/13/2021      If the patient is male:    4. For patients aged 40-74: Has the patient had a mammogram within the past 2 years? N/A      5. For patients aged 21-65: Has the patient had a pap smear? N/a     Health Maintenance Due   Topic Date Due    Depression Screen  Never done    HIV screen  Never done    Hepatitis C screen  Never done    Diabetes screen  Never done    Lipids  Never done    DTaP/Tdap/Td vaccine (1 - Tdap) 01/30/2018    Shingles vaccine (1 of 2) Never done    COVID-19 Vaccine (4 - Booster for Pfizer series) 06/26/2020

## 2021-12-29 ENCOUNTER — Ambulatory Visit
Admit: 2021-12-29 | Discharge: 2022-01-10 | Payer: BLUE CROSS/BLUE SHIELD | Attending: Pediatric Pulmonology | Primary: Family Medicine

## 2021-12-29 DIAGNOSIS — G4733 Obstructive sleep apnea (adult) (pediatric): Secondary | ICD-10-CM

## 2021-12-29 NOTE — Progress Notes (Signed)
[address] [place], [place] [address]  Tel.  [phone]    Fax. [phone]     [address]   [place], [place] [address]  Tel.  [phone]    Fax. [phone]     [address]   [place], [place] [address]  Tel.  [phone]    Fax. [phone]       Jason Greene is a 51 y.o. year old male referred by Dr. Otila Kluver for evaluation of a sleep disorder.       ASSESSMENT/PLAN:     Diagnosis Orders   1. OSA (obstructive sleep apnea)  POLYSOMNOGRAPHY, 4 OR MORE      2. Hypoventilation associated with obesity syndrome (HCC)        3. Primary hypertension        4. BMI 38.0-38.9,adult            Patient has a history and examination consistent with the diagnosis of sleep apnea.    Return for for follow-up after testing is completed.    * The patient currently has a High Risk for having sleep apnea.  STOP-BANG score 8.    * Sleep testing was ordered for initial evaluation.      Orders Placed This Encounter   Procedures    POLYSOMNOGRAPHY, 4 OR MORE     Standing Status:   Future     Standing Expiration Date:   12/30/2022     Scheduling Instructions:      Perform ETCO2 monitoring during Polysomnography       * He was provided information on sleep apnea including corresponding risk factors and the importance of proper treatment.     * Treatment options were reviewed in detail. he would like to proceed with PAP therapy. Patient will be seen in follow-up in 6-8 weeks after PAP setup to gauge treatment response and adherence to therapy.     * The patient was counseled regarding proper sleep hygiene (paradoxical intention and stimulus control reviewed), with emphasis on ensuring sufficient total sleep time; safe driving and the benefits of exercise and weight loss.      * All of his questions were addressed.    2. Hypoventilation associated with obesity syndrome (HCC) - Elevated bicarbonate levels on metabolic screen  in the absence of any significant cardio-pulmonary problem is suggestive of hypoventilation associated with obesity. EtCO2 levels will be monitored during attended polysomnography, this cannot be done on home sleep apnea testing.     3. Hypertension -  continue on current regimen, he will continue to monitor his BP and follow up with his primary care provider for reevaluation/adjustment of medications if warranted.  I have reviewed the relationship between hypertension as it relates to sleep-disordered breathing.    4. Recommended a dedicated weight loss program through appropriate diet and exercise regimen as significant weight reduction has been shown to reduce severity of obstructive sleep apnea.     SUBJECTIVE/OBJECTIVE:    Jason Greene is an 51 y.o. male referred for evaluation for a sleep disorder. He complains of snoring associated with periods of not breathing, excessive daytime sleepiness.  Symptoms began several years ago, he was diagnosed with OSA and prescribed a CPAP device but was not able to tolerate this therapy and has not been on therapy for about 2 year. He usually cannot fall asleep for 2-3 hours after getting in bed at 8:00 pm..  Family or house members note snoring, periods of not  breathing.     Epworth Sleepiness Score: 9  Modified F.O.S.Q. Score Total / 2: (P) 18      He denies of symptoms indicative of cataplexy, sleep paralysis or sleep related hallucinations.    He denies of a history of unusual movements occurring during sleep.    Jason Greene (P) does wake up frequently at night. He (P) is bothered by waking up too early and left unable to get back to sleep. He actually sleeps about (P) 4 hours at night and wakes up about (P) 1 times during the night. 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   Jason Greene indicates he (P) does get too little sleep at night. His bedtime is (P) 2230. He awakens at (P) [phone]. He (P) does not take naps. He takes   naps a week lasting  . He has the following  observed behaviors: (P) Loud snoring, Twitching of legs or feet, Pauses in breathing, Grinding teeth;  .    CO2 21 - 32 mmol/L 33 High       Other remarks:      Allergies   Allergen Reactions    Lisinopril Cough         Current Outpatient Medications:     albuterol sulfate HFA (PROVENTIL;VENTOLIN;PROAIR) 108 (90 Base) MCG/ACT inhaler, as needed, Disp: , Rfl:     aspirin 81 MG EC tablet, Take by mouth, Disp: , Rfl:     atorvastatin (LIPITOR) 20 MG tablet, Take 1 tablet by mouth every evening, Disp: , Rfl:     fluticasone (FLONASE) 50 MCG/ACT nasal spray, by Nasal route, Disp: , Rfl:     fluticasone (FLOVENT HFA) 110 MCG/ACT inhaler, every 12 hours, Disp: , Rfl:     meloxicam (MOBIC) 15 MG tablet, TAKE 1 TABLET BY MOUTH EVERY DAY FOR 2 WEEKS, THEN 1 TAB BY MOUTH EVERY DAY AS NEEDED FOR PAIN (WITH FOOD), Disp: , Rfl:     metFORMIN (GLUCOPHAGE) 500 MG tablet, , Disp: , Rfl:     metoprolol succinate (TOPROL XL) 25 MG extended release tablet, Take 1 tablet by mouth daily, Disp: , Rfl:     pantoprazole (PROTONIX) 40 MG tablet, , Disp: , Rfl:     sildenafil (REVATIO) 20 MG tablet, Take 1-5 tablets by mouth daily as needed, Disp: , Rfl:     valsartan-hydroCHLOROthiazide (DIOVAN-HCT) 160-25 MG per tablet, TAKE 1 TABLET BY MOUTH EVERY DAY **REPLACES LOSARTAN-HCTZ 90**, Disp: , Rfl:     albuterol sulfate HFA (VENTOLIN HFA) 108 (90 Base) MCG/ACT inhaler, Inhale 2 puffs into the lungs 4 times daily as needed for Wheezing, Disp: 18 g, Rfl: 0     He  has a past medical history of Diabetes (HCC), High cholesterol, Hypertension, and OSA (obstructive sleep apnea).    He  has a past surgical history that includes Cholecystectomy.    He family history includes Cancer in his father; Hypertension in his mother; No Known Problems in his daughter, daughter, son, and son.    He  reports that he quit smoking about 19 years ago. His smoking use included cigarettes. He has a 5.00 pack-year smoking history. He has been exposed to tobacco smoke.  He has never used smokeless tobacco. He reports current alcohol use of about 1.0 standard drink per week. He reports that he does not use drugs.     The patient has undergone diagnostic testing for the current problems. He does not have access to his old records.    Review  of Systems:  Constitutional:  No significant weight loss or weight gain  Eyes:  No blurred vision  CVS:  No significant chest pain  Pulm:  No significant shortness of breath  GI:  No significant nausea or vomiting  GU:  + significant nocturia  Musculoskeletal:  No significant joint pain at night  Skin:  No significant rashes  Neuro:  No significant dizziness   Psych:  No active mood issues    Sleep Review of Systems: notable for Positive difficulty falling asleep; Positive awakenings at night; Negative perceived regular dreaming; Negative nightmares; Positive  early morning headaches; Negative  memory problems; Negative  concentration issues; Positive caffeine;  Negative alcohol;   Negative history of any automobile or occupational accidents due to daytime drowsiness.      BP 132/81   Pulse 75   Ht 5\' 9"  (1.753 m)   Wt 263 lb (119.3 kg)   SpO2 95%   BMI 38.84 kg/m         General:   Alert, oriented, not in acute distress   Eyes:  Anicteric Sclerae; intact EOM's   Nose:  No obvious nasal septum deviation    Oropharynx:   Mallampati score 4, thick tongue base, uvula not seen due to low-lying soft palate, narrow tonsilo-pharyngeal pilars, tongue scalloped   Neck:   midline trachea,  no JVD   Chest/Lungs:  symmetrical lung expansion ,clear lung fields on auscultation    CVS:  Normal rate, regular rhythm    Extremities:  No obvious rashes, absent edema    Neuro:  No focal deficits; No obvious tremor    Psych:  Normal eye contact; normal  affect, normal countenance          , MD, FAASM  Diplomate American Board of Sleep Medicine  Diplomate in Sleep Medicine - ABP    Electronically signed. 12/29/21

## 2021-12-29 NOTE — Patient Instructions (Signed)
[address]  [place] [address]  Tel.  [phone]  Fax. [phone] [address]  [place] [address]  Tel.  [phone]  Fax. [phone] [address] [place] [address]  Tel.  [phone]  Fax. [phone]     Sleep Apnea: After Your Visit  Your Care Instructions  Sleep apnea occurs when you frequently stop breathing for 10 seconds or longer during sleep. It can be mild to severe, based on the number of times per hour that you stop breathing or have slowed breathing. Blocked or narrowed airways in your nose, mouth, or throat can cause sleep apnea. Your airway can become blocked when your throat muscles and tongue relax during sleep.  Sleep apnea is common, occurring in 1 out of 20 individuals.  Individuals having any of the following characteristics should be evaluated and treated right away due to high risk and detrimental consequences from untreated sleep apnea:  Obesity  Congestive Heart failure  Atrial Fibrillation  Uncontrolled Hypertension  Type II Diabetes  Night-time Arrhythmias  Stroke  Pulmonary Hypertension  High-risk Driving Populations (pilots, truck drivers, etc.)  Patients Considering Weight-loss Surgery    How do you know you have sleep apnea?  You probably have sleep apnea if you answer 'yes' to 3 or more of the following questions:  S - Have you been told that you Snore?   T - Are you often Tired during the day?  O - Has anyone Observed you stop breathing while sleeping?  P- Do you have (or are being treated for) high blood Pressure?    B - Are you obese (Body Mass Index > 35)?  A - Is your Age 50 years old or older?  N - Is your Neck size greater than 16 inches?  G - Are you male Gender?  A sleep physician can prescribe a breathing device that prevents tissues in the throat from blocking your airway. Or your doctor may recommend using a dental device (oral breathing device) to help keep your airway open. In some cases, surgery may  be needed to remove enlarged tissues in the throat.  Follow-up care is a key part of your treatment and safety. Be sure to make and go to all appointments, and call your doctor if you are having problems. It's also a good idea to know your test results and keep a list of the medicines you take.  How can you care for yourself at home?  Lose weight, if needed. It may reduce the number of times you stop breathing or have slowed breathing.  Go to bed at the same time every night.  Sleep on your side. It may stop mild apnea. If you tend to roll onto your back, sew a pocket in the back of your pajama top. Put a tennis ball into the pocket, and stitch the pocket shut. This will help keep you from sleeping on your back.  Avoid alcohol and medicines such as sleeping pills and sedatives before bed.  Do not smoke. Smoking can make sleep apnea worse. If you need help quitting, talk to your doctor about stop-smoking programs and medicines. These can increase your chances of quitting for good.  Prop up the head of your bed 4 to 6 inches by putting bricks under the legs of the bed.  Treat breathing problems, such as a stuffy nose, caused by a cold or allergies.  Use a continuous positive airway pressure (CPAP) breathing machine if lifestyle   changes do not help your apnea and your doctor recommends it. The machine keeps your airway from closing when you sleep.  If CPAP does not help you, ask your doctor whether you should try other breathing machines. A bilevel positive airway pressure machine has two types of air pressure?"one for breathing in and one for breathing out. Another device raises or lowers air pressure as needed while you breathe.  If your nose feels dry or bleeds when using one of these machines, talk with your doctor about increasing moisture in the air. A humidifier may help.  If your nose is runny or stuffy from using a breathing machine, talk with your doctor about using decongestants or a corticosteroid nasal  spray.  When should you call for help?  Watch closely for changes in your health, and be sure to contact your doctor if:  You still have sleep apnea even though you have made lifestyle changes.  You are thinking of trying a device such as CPAP.  You are having problems using a CPAP or similar machine.                Where can you learn more?   Go to http://www.healthwise.net/[facility].  Enter J936 in the search box to learn more about "Sleep Apnea: After Your Visit."    2006-2010 Healthwise, Inc[employer]. Care instructions adapted under license by [employer] (which disclaims liability or warranty for this information). This care instruction is for use with your licensed healthcare professional. If you have questions about a medical condition or this instruction, always ask your healthcare professional. Healthwise disclaims any warranty or liability for your use of this information.      PROPER SLEEP HYGIENE    What to avoid  Do not have drinks with caffeine, such as coffee or black tea, for 8 hours before bed.  Do not smoke or use other types of tobacco near bedtime. Nicotine is a stimulant and can keep you awake.  Avoid drinking alcohol late in the evening, because it can cause you to wake in the middle of the night.  Do not eat a big meal close to bedtime. If you are hungry, eat a light snack.  Do not drink a lot of water close to bedtime, because the need to urinate may wake you up during the night.  Do not read or watch TV in bed. Use the bed only for sleeping and sexual activity.  What to try  Go to bed at the same time every night, and wake up at the same time every morning. Do not take naps during the day.  Keep your bedroom quiet, dark, and cool.  Get regular exercise, but not within 3 to 4 hours of your bedtime..  Sleep on a comfortable pillow and mattress.  If watching the clock makes you anxious, turn it facing away from you so you cannot see the time.  If you worry when you lie down, start a worry  book. Well before bedtime, write down your worries, and then set the book and your concerns aside.  Try meditation or other relaxation techniques before you go to bed.  If you cannot fall asleep, get up and go to another room until you feel sleepy. Do something relaxing. Repeat your bedtime routine before you go to bed again.  Make your house quiet and calm about an hour before bedtime. Turn down the lights, turn off the TV, log off the computer, and turn down the volume on music. This   can help you relax after a busy day.    Drowsy Driving  The [place] National Highway Traffic Safety Administration cites drowsiness as a causing factor in more than 100,000 police reported crashes annually, resulting in 76,000 injuries and 1,500 deaths. Other surveys suggest 55% of people polled have driven while drowsy in the past year, 23% had fallen asleep but not crashed, 3% crashed, and 2% had and accident due to drowsy driving.  Who is at risk?   Young Drivers: One study of drowsy driving accidents states that 55% of the drivers were under 25 years. Of those, 75% were male.   Shift Workers and Travelers: People who work overnight or travel across time zones frequently are at higher risk of experiencing Circadian Rhythm Disorders. They are trying to work and function when their body is programed to sleep.   Sleep Deprived: Lack of sleep has a serious impact on your ability to pay attention or focus on a task. Consistently getting less than the average of 8 hours your body needs creates partial or cumulative sleep deprivation.   Untreated Sleep Disorders: Sleep Apnea, Narcolepsy, R.L.S., and other sleep disorders (untreated) prevent a person from getting enough restful sleep. This leads to excessive daytime sleepiness and increases the risk for drowsy driving accidents by up to 7 times.  Medications / Alcohol: Even over the counter medications can cause drowsiness. Medications that impair a drivers attention should have a warning  label. Alcohol naturally makes you sleepy and on its own can cause accidents. Combined with excessive drowsiness its effects are amplified.   Signs of Drowsy Driving:   * You don't remember driving the last few miles   * You may drift out of your lane   * You are unable to focus and your thoughts wander   * You may yawn more often than normal   * You have difficulty keeping your eyes open / nodding off   * Missing traffic signs, speeding, or tailgating  Prevention-   Good sleep hygiene, lifestyle and behavioral choices have the most impact on drowsy driving. There is no substitute for sleep and the average person requires 8 hours nightly. If you find yourself driving drowsy, stop and sleep. Consider the sleep hygiene tips provided during your visit as well.     Medication Refill Policy: Refills for all medications require 1 week advance notice. Please have your pharmacy fax a refill request. We are unable to fax, or call in "controled substance" medications and you will need to pick these prescriptions up from our office.

## 2022-01-10 DIAGNOSIS — G4733 Obstructive sleep apnea (adult) (pediatric): Secondary | ICD-10-CM | POA: Diagnosis not present

## 2022-01-10 DIAGNOSIS — I10 Essential (primary) hypertension: Secondary | ICD-10-CM | POA: Diagnosis not present

## 2022-01-17 ENCOUNTER — Inpatient Hospital Stay: Admit: 2022-01-17 | Payer: BLUE CROSS/BLUE SHIELD | Primary: Family Medicine

## 2022-01-17 DIAGNOSIS — G4733 Obstructive sleep apnea (adult) (pediatric): Secondary | ICD-10-CM

## 2022-01-17 NOTE — Progress Notes (Signed)
Acquisition Notes:  Lights off: 2156    Respiratory events:   A---Y    H---Y  C---N  M---N  ECG:    Arrhythmias: N  Snoring: severe  Sleep Stages: 1, 2, 3  REM: Y  PAP titration: No  Eliminated events: N/A  Reduced events: N/A  Events at final pressure: N/A  Leak: N/A

## 2022-01-25 ENCOUNTER — Telehealth

## 2022-01-25 MED ORDER — VALSARTAN-HYDROCHLOROTHIAZIDE 160-25 MG PO TABS
160-25 MG | ORAL_TABLET | Freq: Every day | ORAL | 3 refills | Status: AC
Start: 2022-01-25 — End: 2023-03-27

## 2022-01-25 MED ORDER — METFORMIN HCL 500 MG PO TABS
500 MG | ORAL_TABLET | Freq: Every day | ORAL | 3 refills | Status: AC
Start: 2022-01-25 — End: ?

## 2022-01-25 MED ORDER — METOPROLOL SUCCINATE ER 50 MG PO TB24
50 MG | ORAL_TABLET | Freq: Every day | ORAL | 3 refills | Status: AC
Start: 2022-01-25 — End: ?

## 2022-01-25 MED ORDER — ATORVASTATIN CALCIUM 20 MG PO TABS
20 MG | ORAL_TABLET | Freq: Every evening | ORAL | 3 refills | Status: AC
Start: 2022-01-25 — End: ?

## 2022-01-25 MED ORDER — PANTOPRAZOLE SODIUM 40 MG PO TBEC
40 MG | ORAL_TABLET | Freq: Every morning | ORAL | 3 refills | Status: AC
Start: 2022-01-25 — End: 2023-04-03

## 2022-01-25 NOTE — Telephone Encounter (Signed)
These meds are listed as historical. Please sign if appropriate.    Last appointment: 10/29/21  Next appointment: 02/04/22    Requested Prescriptions     Pending Prescriptions Disp Refills    metFORMIN (GLUCOPHAGE) 500 MG tablet [Pharmacy Med Name: METFORMIN HCL 500 MG TABLET] 90 tablet 3     Sig: Take 1 tablet by mouth daily    pantoprazole (PROTONIX) 40 MG tablet [Pharmacy Med Name: PANTOPRAZOLE SOD DR 40 MG TAB] 90 tablet 3     Sig: Take 1 tablet by mouth every morning    metoprolol succinate (TOPROL XL) 50 MG extended release tablet [Pharmacy Med Name: METOPROLOL SUCC ER 50 MG TAB] 90 tablet 3     Sig: Take 1 tablet by mouth daily After a meal    valsartan-hydroCHLOROthiazide (DIOVAN-HCT) 160-25 MG per tablet [Pharmacy Med Name: VALSARTAN-HCTZ 160-25 MG TAB] 90 tablet 3     Sig: Take 1 tablet by mouth daily    atorvastatin (LIPITOR) 20 MG tablet [Pharmacy Med Name: ATORVASTATIN 20 MG TABLET] 90 tablet 3     Sig: Take 1 tablet by mouth every evening       For Pharmacy Admin Tracking Only    Program: Medication Refill  CPA in place:    Recommendation Provided To:   Intervention Detail: New Rx: 5, reason: Patient Preference  Intervention Accepted By:   Eugenia Pancoast Closed?:    Time Spent (min): 5

## 2022-01-28 NOTE — Progress Notes (Signed)
Sleep study report routed to Dr. Otila Kluver on 01/28/2022

## 2022-01-28 NOTE — Telephone Encounter (Signed)
Jason Greene is to be contacted by sleep technologists regarding results of Sleep Testing which was indicative of an average AHI of 26.7 per hour with an SpO2 nadir of 79% and SpO2 of < 88% being 13.20 minutes.        An APAP prescription has been written and patient will be contacted by office staff regarding follow-up  in 2-3 months after initiation of therapy.    Encounter Diagnosis   Name Primary?    OSA (obstructive sleep apnea) Yes       Orders Placed This Encounter   Procedures    DME Order for (Specify) as OP     - DME device ordered - ResMed Device with Heated Humidifer [phone] / [phone].   - Diagnosis: Obstructive Sleep Apnea (G47.33)  - Length of Need: Lifetime    Pressure Setting: 06 - 15 cmH2O    A7032 Nasal Cushion (Replace) 2 per month.    I9678 Nasal Interface Mask 1 every 3 months.   L3810 Headgear 1 every 6 months.    F7510 Filter(s) Disposable 2 per month.  C5852 Filter(s) Non-Disposable 1 every 6 months.     D7824 Water Chamber for [employer] (Replace) 1 every 6 months.  M3536 Tubing with heating element 1 every 3 months.    Perform Mask Fitting per patient preference and comfort - replace as above.      [name], MD, FAASM; NPI: [id number]  Electronically signed. 01/28/2022

## 2022-02-03 NOTE — Telephone Encounter (Signed)
Pap order faxed to [facility] on 02/03/2022

## 2022-02-03 NOTE — Progress Notes (Signed)
Pap order faxed to [facility] on 02/03/2022

## 2022-02-04 ENCOUNTER — Ambulatory Visit: Admit: 2022-02-04 | Payer: BLUE CROSS/BLUE SHIELD | Attending: Family Medicine | Primary: Family Medicine

## 2022-02-04 DIAGNOSIS — E119 Type 2 diabetes mellitus without complications: Secondary | ICD-10-CM

## 2022-02-04 LAB — AMB POC HEMOGLOBIN A1C: Hemoglobin A1C, POC: 5.9 %

## 2022-02-04 MED ORDER — TADALAFIL 5 MG PO TABS
5 | ORAL_TABLET | ORAL | 1 refills | 30.00000 days | Status: DC
Start: 2022-02-04 — End: 2023-11-23

## 2022-02-04 NOTE — Progress Notes (Signed)
Name and Date of Birth Verified    Pharmacy Verified    Chief Complaint   Patient presents with    Follow-up     3 Months 10/29/2021 Diabetes     Fasting [facility], [facility].    1. "Have you been to the ER, urgent care clinic since your last visit?  Hospitalized since your last visit?" No    2. "Have you seen or consulted any other health care providers outside of the [facility] since your last visit?" No     3. For patients aged 53-75: Has the patient had a colonoscopy / FIT/ Cologuard?     Yes 09/13/2021 Due: 09/14/2022 Occult      If the patient is male:    4. For patients aged 60-74: Has the patient had a mammogram within the past 2 years? N/A      5. For patients aged 21-65: Has the patient had a pap smear? N/A     Health Maintenance Due   Topic Date Due    Pneumococcal 0-64 years Vaccine (1 - PCV) Never done    Lipids  Never done    HIV screen  Never done    Hepatitis C screen  Never done    DTaP/Tdap/Td vaccine (1 - Tdap) 01/30/2018    Flu vaccine (1) 01/18/2022

## 2022-02-04 NOTE — Progress Notes (Signed)
[facility]  [facility]  [phone]. [address]  [place], [place] [address]  [phone]    Chief Complaint: Diabetes mellitus and Erectile Dysfunction    Subjective  Jason Greene is a 51 y.o. Black / [place] American male , established patient, here for evaluation of the concern(s) above;    Diabetes:  Diagnosed with borderline diabetes (A1C 6.6%) in 11/2019.  On metformin 500mg  daily.    ED:  On sildenafil but states that he hate taking it only when needed. Would like something he can take daily.     Pertinent negatives include  no chest pain, no abdominal pain and no shortness of breath.        Allergies - reviewed:   Allergies   Allergen Reactions    Lisinopril Cough       Past Medical History - reviewed:  Past Medical History:   Diagnosis Date    Diabetes (HCC)     High cholesterol     Hypertension     OSA (obstructive sleep apnea)        Depression screening:  No data recorded    Review of systems:   A comprehensive review of systems was negative except for that written in the History of Present Illness.     Physical Exam  BP 120/81 (Site: Left Upper Arm, Position: Sitting, Cuff Size: Medium Adult)   Pulse 74   Temp 97 F (36.1 C) (Oral)   Resp 17   Ht 5\' 9"  (1.753 m)   Wt 262 lb (118.8 kg)   SpO2 97%   BMI 38.69 kg/m     General: Alert and oriented, in no acute distress.  LUNGS: Respirations unlabored; clear to auscultation bilaterally.  CARDIOVASCULAR: Regular, rate, and rhythm without murmurs, gallops or rubs.  ABDOMEN: Soft; nontender; nondistended; normoactive bowel sounds; no masses or organomegaly.  MUSCULOSKELETAL: FROM in all extremities     EXT: No edema. Neurovascularlly intact. Normal gait.  Neurological: Alert and oriented X 3, normal strength and tone. Normal symmetric reflexes. Normal coordination and gait     Assessment/Plan   Diagnosis Orders   1. Type II diabetes mellitus, well controlled (HCC)  AMB POC HEMOGLOBIN A1C    HM DIABETES FOOT EXAM      2.  Erectile dysfunction, unspecified erectile dysfunction type  tadalafil (CIALIS) 5 MG tablet          1. Type II diabetes mellitus, well controlled (HCC)  Lab Results   Component Value Date/Time    HBA1CPOC 5.9 02/04/2022 10:10 AM       - Encouraged to work on lifestyle modifications including diet and exercise.  - Continue home monitoring. Glucose goals; Fasting (80-130), preprandial (<140), 2-hr post-prandial (<180)  - Continue Metformin 500mg  daily    2. Erectile dysfunction, unspecified erectile dysfunction type  - tadalafil (CIALIS) 5 MG tablet; START WITH 1/2 tablet daily. Increase to 1 tab daily after 2 weeks if tolerated  Dispense: 90 tablet; Refill: 1      Follow up: 6 months. RTC to clinic sooner should symptoms persist, worsen or fail to improve as anticipated.    We discussed the expected course, resolution and complications of the diagnosis(es) in detail.  Medication risks, benefits, costs, interactions, and alternatives were discussed as indicated.  I advised to contact the office if his condition worsens, changes or fails to improve as anticipated. Pt expressed understanding with the diagnosis(es) and plan. Patient understands that this encounter was a temporary measure,  and the importance of further follow up and examination was emphasized.  Patient verbalized understanding.      Signed By: Rene Paci, MD     February 04, 2022

## 2022-02-25 NOTE — Telephone Encounter (Signed)
Last appointment: 02/04/22  Next appointment: 08/05/22  Previous refill encounter(s): 10/29/21 #1    Requested Prescriptions     Pending Prescriptions Disp Refills    albuterol sulfate HFA (PROVENTIL;VENTOLIN;PROAIR) 108 (90 Base) MCG/ACT inhaler [Pharmacy Med Name: ALBUTEROL HFA (PROVENTIL) INH] 1 each 5     Sig: INHALE 2 PUFFS AS NEEDED FOUR TIMES A DAY         For Pharmacy Admin Tracking Only    Program: Medication Refill  CPA in place:    Recommendation Provided To:   Intervention Detail: New Rx: 1, reason: Patient Preference  Intervention Accepted By:   Eugenia Pancoast Closed?:    Time Spent (min): 5

## 2022-02-27 MED ORDER — ALBUTEROL SULFATE HFA 108 (90 BASE) MCG/ACT IN AERS
108 (90 Base) MCG/ACT | RESPIRATORY_TRACT | 5 refills | Status: AC
Start: 2022-02-27 — End: 2022-06-02

## 2022-06-01 NOTE — Telephone Encounter (Signed)
Appt scheduled with Dr Jordan Likes on 06/02/22@9a 

## 2022-06-01 NOTE — Telephone Encounter (Signed)
Patient would like for Dr.Enow to send him something into the pharmacy for severe cough he does not want  benzonatate (TESSALON) 200 MG capsule give him something else he can be reached @ [phone]

## 2022-06-02 ENCOUNTER — Telehealth
Admit: 2022-06-02 | Discharge: 2022-06-02 | Payer: BLUE CROSS/BLUE SHIELD | Attending: Student in an Organized Health Care Education/Training Program | Primary: Family Medicine

## 2022-06-02 DIAGNOSIS — J40 Bronchitis, not specified as acute or chronic: Secondary | ICD-10-CM

## 2022-06-02 MED ORDER — BENZONATATE 100 MG PO CAPS
100 MG | ORAL_CAPSULE | Freq: Three times a day (TID) | ORAL | 0 refills | Status: AC | PRN
Start: 2022-06-02 — End: 2022-06-12

## 2022-06-02 MED ORDER — FLUTICASONE-SALMETEROL 100-50 MCG/ACT IN AEPB
100-50 MCG/ACT | Freq: Two times a day (BID) | RESPIRATORY_TRACT | 0 refills | Status: DC
Start: 2022-06-02 — End: 2022-07-19

## 2022-06-02 MED ORDER — ALBUTEROL SULFATE HFA 108 (90 BASE) MCG/ACT IN AERS
10890 (90 Base) MCG/ACT | Freq: Four times a day (QID) | RESPIRATORY_TRACT | 0 refills | Status: DC | PRN
Start: 2022-06-02 — End: 2022-09-13

## 2022-06-02 MED ORDER — AZITHROMYCIN 250 MG PO TABS
250 MG | ORAL_TABLET | ORAL | 0 refills | Status: AC
Start: 2022-06-02 — End: 2022-06-07

## 2022-06-02 NOTE — Progress Notes (Signed)
[facility]    [facility]  [phone]. [address]  [place], [place] [address]  [phone]    Virtual Visit    Jason Greene, was evaluated through a synchronous (real-time) audio-video encounter. The patient (or guardian if applicable) is aware that this is a billable service, which includes applicable co-pays. This Virtual Visit was conducted with patient's (and/or legal guardian's) consent. Patient identification was verified, and a caregiver was present when appropriate.   The patient was located at Other: Work in [place]  Provider was located at [employer] (Appt Dept): [address] [place],  [place] [address]      CC:   Chief Complaint   Patient presents with    Cough     For the past two month, took a covid test "negative"     Jason Greene (DOB:  02/26/1971) is a Established patient, presenting virtually for evaluation of the following:      Subjective  Cough  Has been coughing for about 2 months. Taking coricidin at night. Covid has been negative on  each test.  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  Has been using his albuterol 2-3 times per day.  He was prescribed Flovent but states that he does not have this and therefore has not been taking it  Cough is productive of some clear mucus.  States that the cough is keeping him up at night and is pretty continuous.  His coworkers have also grown concerned  He states that he initially was prescribed benzonatate which helped then when he ran out the cough returned and was worse  Denies any fever, chills, chest pain            ROS negative except for as stated in the HPI    History reviewed and updated in chart as appropriate.       Objective  Patient-Reported Vitals  No data recorded       [INSTRUCTIONS:  "[x] " Indicates a positive item  "[] " Indicates a negative item  -- DELETE ALL ITEMS NOT EXAMINED]    Constitutional: [x]  Appears  well-developed and well-nourished [x]  No apparent distress      []  Abnormal -     Mental status: [x]  Alert and awake  [x]  Oriented to person/place/time [x]  Able to follow commands    []  Abnormal -     Eyes:   EOM    [x]   Normal    []  Abnormal -   Sclera  [x]   Normal    []  Abnormal -          Discharge [x]   None visible   []  Abnormal -     HENT: [x]  Normocephalic, atraumatic  []  Abnormal -   [x]  Mouth/Throat: Mucous membranes are moist    External Ears [x]  Normal  []  Abnormal -    Neck: [x]  No visualized mass []  Abnormal -     Pulmonary/Chest: [x]  Respiratory effort normal   [x]  No visualized signs of difficulty breathing or respiratory distress        [x]  Abnormal -patient has a frequent deep cough with some congestion     Musculoskeletal:   []  Normal gait with no signs of ataxia         [x]  Normal range of motion of neck        []  Abnormal -  Neurological:        [x]  No Facial Asymmetry (Cranial nerve 7 motor function) (limited exam due to video visit)          [x]  No gaze palsy        []  Abnormal -          Skin:        [x]  No significant exanthematous lesions or discoloration noted on facial skin         []  Abnormal -            Psychiatric:       [x]  Normal Affect []  Abnormal -        [x]  No Hallucinations    Other pertinent observable physical exam findings:-         Assessment and Plan  Below is the assessment and plan developed based on review of pertinent history, physical exam, labs, studies, and medications.  Current Outpatient Medications on File Prior to Visit   Medication Sig Dispense Refill    tamsulosin (FLOMAX) 0.4 MG capsule Take by mouth daily      tadalafil (CIALIS) 5 MG tablet START WITH 1/2 tablet daily. Increase to 1 tab daily after 2 weeks if tolerated 90 tablet 1    metFORMIN (GLUCOPHAGE) 500 MG tablet Take 1 tablet by mouth daily 90 tablet 3    pantoprazole (PROTONIX) 40 MG tablet Take 1 tablet by mouth every morning 90 tablet 3    metoprolol succinate (TOPROL XL) 50 MG extended release  tablet Take 1 tablet by mouth daily After a meal 90 tablet 3    valsartan-hydroCHLOROthiazide (DIOVAN-HCT) 160-25 MG per tablet Take 1 tablet by mouth daily 90 tablet 3    atorvastatin (LIPITOR) 20 MG tablet Take 1 tablet by mouth every evening 90 tablet 3    aspirin 81 MG EC tablet Take by mouth      fluticasone (FLONASE) 50 MCG/ACT nasal spray by Nasal route      meloxicam (MOBIC) 15 MG tablet TAKE 1 TABLET BY MOUTH EVERY DAY FOR 2 WEEKS, THEN 1 TAB BY MOUTH EVERY DAY AS NEEDED FOR PAIN (WITH FOOD)       No current facility-administered medications on file prior to visit.       1. Bronchitis  Chronic, uncontrolled  - benzonatate (TESSALON) 100 MG capsule; Take 1 capsule by mouth 3 times daily as needed for Cough  Dispense: 30 capsule; Refill: 0  - azithromycin (ZITHROMAX) 250 MG tablet; Take 1 tablet by mouth See Admin Instructions for 5 days 500mg  on day 1 followed by 250mg  on days 2 - 5  Dispense: 6 tablet; Refill: 0  - XR CHEST (2 VIEWS); Future    2. Mild intermittent asthma with acute exacerbation  Chronic, uncontrolled  - fluticasone-salmeterol (ADVAIR DISKUS) 100-50 MCG/ACT AEPB diskus inhaler; Inhale 1 puff into the lungs in the morning and 1 puff in the evening.  Dispense: 1 each; Refill: 0  - albuterol sulfate HFA (VENTOLIN HFA) 108 (90 Base) MCG/ACT inhaler; Inhale 2 puffs into the lungs 4 times daily as needed for Wheezing  Dispense: 18 g; Refill: 0      Return if symptoms worsen or fail to improve.  Patient Instructions   I have sent in 2 inhalers, one is called Advair.  I called in the Advair instead of the Symbicort due to insurance coverage.  You will take 1 puff twice a day regardless of your symptoms.  I have also called in an albuterol  inhaler which you continue 2 puffs every 4 hours as needed for shortness of breath or cough    There is also some Tessalon Perles that will be available for you at the pharmacy.  These can be used as needed for cough.  Have also prescribed a Z-Pak which she will  take according to the package instructions.    If your symptoms do not improve after a week of treatment, please come to our clinic between the hours of 8 AM to 4 PM Monday through Friday to get your chest x-ray completed      --Bobbye Morton, DO  06/02/22  9:50 AM

## 2022-06-02 NOTE — Progress Notes (Signed)
Chief Complaint   Patient presents with    Cough     For the past two month, took a covid test "negative"        "Have you been to the ER, urgent care clinic since your last visit?  Hospitalized since your last visit?"    NO    "Have you seen or consulted any other health care providers outside of [facility] since your last visit?"    NO            There were no vitals filed for this visit.     Health Maintenance Due   Topic Date Due    Hepatitis B vaccine (1 of 3 - 3-dose series) Never done    Pneumococcal 0-64 years Vaccine (1 - PCV) Never done    Lipids  Never done    HIV screen  Never done    Diabetic Alb to Cr ratio (uACR) test  Never done    Diabetic retinal exam  Never done    Hepatitis C screen  Never done    DTaP/Tdap/Td vaccine (1 - Tdap) 01/30/2018    Flu vaccine (1) 01/18/2022    COVID-19 Vaccine (5 - 2023-24 season) 02/18/2022        The patient, Jason Greene, identity was verified by name and DOB.

## 2022-06-02 NOTE — Patient Instructions (Addendum)
I have sent in 2 inhalers, one is called Advair.  I called in the Advair instead of the Symbicort due to insurance coverage.  You will take 1 puff twice a day regardless of your symptoms.  I have also called in an albuterol inhaler which you continue 2 puffs every 4 hours as needed for shortness of breath or cough    There is also some Tessalon Perles that will be available for you at the pharmacy.  These can be used as needed for cough.  Have also prescribed a Z-Pak which she will take according to the package instructions.    If your symptoms do not improve after a week of treatment, please come to our clinic between the hours of 8 AM to 4 PM Monday through Friday to get your chest x-ray completed

## 2022-06-10 ENCOUNTER — Encounter

## 2022-06-10 ENCOUNTER — Ambulatory Visit: Admit: 2022-06-10 | Discharge: 2022-06-15 | Payer: BLUE CROSS/BLUE SHIELD | Primary: Family Medicine

## 2022-06-10 DIAGNOSIS — J40 Bronchitis, not specified as acute or chronic: Secondary | ICD-10-CM

## 2022-07-01 NOTE — Telephone Encounter (Signed)
Location of patient: [place] call from [place] at [facility] with [employer].    Subjective: Caller states ""     Current Symptoms: cough-clear, HA-frontal, SOB-exertion, started 2 weeks ago. Nasal congestion-clear. Wheezing at times- present now    Onset: ongoing 2 months    Associated Symptoms: reduced activity    Pain Severity: 4/10; dull; worsens with cough    Temperature: denies fevers     What has been tried: inhaler, disk    LMP: NA Pregnant: NA    Recommended disposition: Go to [facility]    Care advice provided, patient verbalizes understanding; denies any other questions or concerns; instructed to call back for any new or worsening symptoms.    Patient/Caller agrees with recommended disposition; Probation officer provided warm transfer to [employer] at [employer] for appointment scheduling    Attention Provider:  Thank you for allowing me to participate in the care of your patient.  The patient was connected to triage in response to information provided to the ECC/PSC.  Please do not respond through this encounter as the response is not directed to a shared pool.      Reason for Disposition   MILD difficulty breathing (e.g., minimal/no SOB at rest, SOB with walking, pulse < 100) of new-onset or worse than normal    Protocols used: Breathing Difficulty-ADULT-[place]

## 2022-07-01 NOTE — Telephone Encounter (Signed)
Subjective: Caller states ""      Current Symptoms: cough-clear, HA-frontal, SOB-exertion, started 2 weeks ago. Nasal congestion-clear. Wheezing at times- present now.  Please give him a call @ [phone]

## 2022-07-01 NOTE — Telephone Encounter (Signed)
Appt scheduled for 07/04/22, declined appt for 07/01/22

## 2022-07-04 ENCOUNTER — Ambulatory Visit
Admit: 2022-07-04 | Discharge: 2022-07-04 | Payer: BLUE CROSS/BLUE SHIELD | Attending: Family Medicine | Primary: Family Medicine

## 2022-07-04 ENCOUNTER — Encounter

## 2022-07-04 DIAGNOSIS — J209 Acute bronchitis, unspecified: Secondary | ICD-10-CM

## 2022-07-04 MED ORDER — PREDNISONE 20 MG PO TABS
20 MG | ORAL_TABLET | Freq: Two times a day (BID) | ORAL | 0 refills | Status: AC
Start: 2022-07-04 — End: 2022-07-09

## 2022-07-04 MED ORDER — HYDROCOD POLI-CHLORPHE POLI ER 10-8 MG/5ML PO SUER
10-8 MG/5ML | Freq: Two times a day (BID) | ORAL | 0 refills | Status: DC | PRN
Start: 2022-07-04 — End: 2022-07-04

## 2022-07-04 MED ORDER — DOXYCYCLINE HYCLATE 100 MG PO TABS
100 MG | ORAL_TABLET | Freq: Two times a day (BID) | ORAL | 0 refills | Status: AC
Start: 2022-07-04 — End: 2022-07-11

## 2022-07-04 NOTE — Progress Notes (Signed)
Chief Complaint   Patient presents with    Congestion     Cough x 2 months     Home COVID test 06/30/2022 Negative.    Has been taking Coricidin and inhaler given by Dr. Ronnald Ramp 06/02/2022    "Have you been to the ER, urgent care clinic since your last visit?  Hospitalized since your last visit?"    NO    "Have you seen or consulted any other health care providers outside of [facility] since your last visit?"    NO           There were no vitals filed for this visit.  Health Maintenance Due   Topic Date Due    Hepatitis B vaccine (1 of 3 - 3-dose series) Never done    Pneumococcal 0-64 years Vaccine (1 - PCV) Never done    Lipids  Never done    HIV screen  Never done    Diabetic Alb to Cr ratio (uACR) test  Never done    Diabetic retinal exam  Never done    Hepatitis C screen  Never done    DTaP/Tdap/Td vaccine (1 - Tdap) 01/30/2018    Flu vaccine (1) 01/18/2022    COVID-19 Vaccine (5 - 2023-24 season) 02/18/2022      The patient, Jason Greene, identity was verified by name and DOB, pharmacy verified  Labs:N/A  Fasting:N/A

## 2022-07-04 NOTE — Telephone Encounter (Signed)
Patient would like to know if Dr.Enow can send RX   HYDROcodone-chlorpheniramine ([place]) 10-8 MG/5ML SUER to [facility] [place] he can be reached @ [phone]

## 2022-07-04 NOTE — Progress Notes (Signed)
[facility]  [phone]. [address] [place] [address]  [phone]    Chief Complaint: Acute Bronchitis    Subjective  Jason Greene is a 52 y.o. Black / [place] American male , established patient, here for evaluation of the concern(s) above;    SUBJECTIVE:     Pt would like to be evaluated for the problem(s) listed above:     Over the past 2 months, the patient has developed a deep cough with associated chest discomfort.  Had a visit and treated with abx/inhaler/tessalon as well as had negative cxr.  States that symptoms have persisted.    Of note, per outside notes, pt have had several episodes of bronchitis with h/o asthma for which he saw pulm Chesley Mires, MD). Takes his inhalers.     Patient has been achy and tired. Patient denies any fever, chills, chest pain at rest, SOB, nausea or vomiting.          Review of systems:       A comprehensive review of systems was negative except for that written in the History of Present Illness.         PHYSICAL EXAM:     General: alert, cooperative, no distress   Mental  status: mental status: alert, oriented to person, place, and time, normal mood, behavior, speech, dress, motor activity, and thought processes   Resp: resp: normal effort and no respiratory distress   Neuro: neuro: no gross deficits   Skin: skin: no discoloration or lesions of concern on visible areas   Due to this being a TeleHealth evaluation, many elements of the physical examination are unable to be assessed.        ASSESSMENT/PLAN:      Diagnosis Orders   1. Acute bronchitis, unspecified organism  HYDROcodone-chlorpheniramine (TUSSIONEX) 10-8 MG/5ML SUER    predniSONE (DELTASONE) 20 MG tablet    doxycycline hyclate (VIBRA-TABS) 100 MG tablet        1. Acute bronchitis, unspecified organism    - HYDROcodone-chlorpheniramine ([employer]) 10-8 MG/5ML SUER; Take 5 mLs by mouth every 12 hours as needed (cough) for up to 10 days. Max Daily Amount: 10 mLs   Dispense: 115 mL; Refill: 0  - predniSONE (DELTASONE) 20 MG tablet; Take 1 tablet by mouth 2 times daily for 5 days  Dispense: 10 tablet; Refill: 0  - doxycycline hyclate (VIBRA-TABS) 100 MG tablet; Take 1 tablet by mouth 2 times daily for 7 days  -continue using inhaler  -will consider further pulm evaluation if still persistent       FOLLOW UP: 2 weeks        We discussed the expected course, resolution and complications of the diagnosis(es) in detail. Patient was in [place] at the time of consultation. Medication risks, benefits, costs, interactions, and alternatives were discussed as indicated.  I advised her to contact the office if her condition worsens, changes or fails to improve as anticipated. She expressed understanding with the diagnosis(es) and plan. Patient understands that this encounter was a temporary measure, and the importance of further follow up and examination was emphasized.  Patient verbalized understanding.           Jason Greene is being evaluated by a Virtual Visit (video visit) encounter to address concerns as mentioned above.  A caregiver was present when appropriate. Due to this being a Scientist, physiological (During BJSEG-31 public health emergency), evaluation of the following organ systems was limited: Vitals/Constitutional/EENT/Resp/CV/GI/GU/MS/Neuro/Skin/Heme-Lymph-Imm.  Pursuant to the emergency declaration under the [place], [place] waiver authority and the [employer] Act, this Virtual Visit was conducted with patient's (and/or legal guardian's) consent, to reduce the patient's risk of exposure to COVID-19 and provide necessary medical care.  The patient (and/or legal guardian) has also been advised to contact this office for worsening conditions or problems, and seek emergency medical treatment and/or call 911 if deemed necessary.     Patient identification was verified at the start of the  visit: YES     Services were provided through a video synchronous discussion virtually to substitute for in-person clinic visit. Patient was located at home and provider was located in office or at home.      An electronic signature was used to authenticate this note.     -- Clearence Ped, MD      CPT Codes [phone] for Established Patients may apply to this Telehealth Visit.  POS code: 62.  Modifier GT

## 2022-07-04 NOTE — Telephone Encounter (Signed)
Call placed to pt.   Informed tussinex sent to pharmacy

## 2022-07-05 MED ORDER — HYDROCOD POLI-CHLORPHE POLI ER 10-8 MG/5ML PO SUER
10-85 MG/5ML | Freq: Two times a day (BID) | ORAL | 0 refills | Status: AC | PRN
Start: 2022-07-05 — End: 2022-07-14

## 2022-07-19 ENCOUNTER — Ambulatory Visit: Admit: 2022-07-19 | Payer: BLUE CROSS/BLUE SHIELD | Primary: Family Medicine

## 2022-07-19 ENCOUNTER — Ambulatory Visit
Admit: 2022-07-19 | Discharge: 2022-07-19 | Payer: BLUE CROSS/BLUE SHIELD | Attending: Family Medicine | Primary: Family Medicine

## 2022-07-19 ENCOUNTER — Encounter

## 2022-07-19 DIAGNOSIS — E119 Type 2 diabetes mellitus without complications: Secondary | ICD-10-CM

## 2022-07-19 DIAGNOSIS — R0609 Other forms of dyspnea: Secondary | ICD-10-CM

## 2022-07-19 LAB — AMB POC HEMOGLOBIN A1C: Hemoglobin A1C, POC: 6.2 %

## 2022-07-19 MED ORDER — FLUTICASONE-SALMETEROL 100-50 MCG/ACT IN AEPB
100-50 MCG/ACT | Freq: Two times a day (BID) | RESPIRATORY_TRACT | 0 refills | Status: DC
Start: 2022-07-19 — End: 2022-08-05

## 2022-07-19 MED ORDER — POTASSIUM CHLORIDE CRYS ER 20 MEQ PO TBCR
20 | ORAL_TABLET | Freq: Every day | ORAL | 0 refills | 33.00000 days | Status: DC
Start: 2022-07-19 — End: 2023-11-23

## 2022-07-19 MED ORDER — BUMETANIDE 1 MG PO TABS
1 | ORAL_TABLET | Freq: Every day | ORAL | 0 refills | 30.00000 days | Status: DC
Start: 2022-07-19 — End: 2023-11-23

## 2022-07-19 NOTE — Progress Notes (Signed)
Chief Complaint   Patient presents with    Follow-up     07/04/2022 Acute bronchitis  Diabetes       Patient still has cough, but stated cough medication HYDROcodone-chlorpheniramine  is helping.    "Have you been to the ER, urgent care clinic since your last visit?  Hospitalized since your last visit?"    NO    "Have you seen or consulted any other health care providers outside of [facility] since your last visit?"    NO           Vitals:    07/19/22 0819   BP: 124/76   Pulse: 96   Resp: 17   Temp: 98.6 F (37 C)   SpO2: 99%     Health Maintenance Due   Topic Date Due    Hepatitis B vaccine (1 of 3 - 3-dose series) Never done    Pneumococcal 0-64 years Vaccine (1 - PCV) Never done    Lipids  Never done    HIV screen  Never done    Diabetic Alb to Cr ratio (uACR) test  Never done    Diabetic retinal exam  Never done    Hepatitis C screen  Never done    DTaP/Tdap/Td vaccine (1 - Tdap) 01/30/2018    Flu vaccine (1) 01/18/2022    COVID-19 Vaccine (5 - 2023-24 season) 02/18/2022      The patient, Jason Greene, identity was verified by name and DOB, pharmacy verified  Labs:Yes  Fasting:Yes- [facility]

## 2022-07-19 NOTE — Progress Notes (Signed)
[facility]  [phone] [address]  [phone]    Chief Complaint: chronic medical problems    Subjective  Jason Greene is a 52 y.o. Black / [place] American male , established patient, here for evaluation of the concern(s) above;    PMHx: DM II, ED, HFpEF (last Echo 05/2020- not following with cards), OSA on CPAP  and chronic cough here for routine follow up;    Chronic cough/Asthma and CHF:  Pt has had several episodes of bronchitis toward the end of last year with recent episode being about 2 weeks ago. Has been treated with several courses of abx, prednisone, cough suppressants and albuterol. Symptoms have gotten better but still gets winded especially with exertion. Unable to sleep flat at night. Denies any leg swelling.    Has h/o HFpEF (last echo 05/2020) which he said he was not aware of. Per chart review, he dr. Harrell Gave End (with [facility]  in [place]) and ha also seen pulm at that time.     Has OSA on CPAP which helps.    Has h/o asthma for which he uses albuterol.     Diabetes:  Diagnosed with borderline diabetes (A1C 6.6%) in 11/2019.  On metformin 500mg  daily.  Tolerating well without any side effects.    ED:  On sildenafil but states that he hate taking it only when needed. Would like something he can take daily.     Pertinent negatives include  no chest pain, no abdominal pain, fever or chills.       Allergies - reviewed:   Allergies   Allergen Reactions    Lisinopril Cough       Past Medical History - reviewed:  Past Medical History:   Diagnosis Date    Asthma     Diabetes ([place])     Erectile dysfunction     GERD (gastroesophageal reflux disease)     High cholesterol     Hypertension     Obesity     OSA (obstructive sleep apnea)        Depression screening:  PHQ-9 Total Score: 0 (07/19/2022  8:17 AM)      Review of systems:   A comprehensive review of systems was negative except for that written in the History of Present  Illness.     Physical Exam  BP 124/76 (Site: Left Upper Arm, Position: Sitting, Cuff Size: Medium Adult)   Pulse 96   Temp 98.6 F (37 C) (Tympanic)   Resp 17   Ht 1.803 m (5\' 11" )   Wt 122 kg (268 lb 15.4 oz)   SpO2 99%   BMI 37.51 kg/m     General: Alert and oriented, in no acute distress.  LUNGS: Respirations unlabored; clear to auscultation bilaterally.  CARDIOVASCULAR: Regular, rate, and rhythm without murmurs, gallops or rubs.  ABDOMEN: Soft; nontender; nondistended; normoactive bowel sounds; no masses or organomegaly.  MUSCULOSKELETAL: FROM in all extremities     EXT: No edema. Neurovascularlly intact. Normal gait.  Neurological: Alert and oriented X 3, normal strength and tone. Normal symmetric reflexes. Normal coordination and gait     Assessment/Plan   Diagnosis Orders   1. Type II diabetes mellitus, well controlled ([place])  AMB POC HEMOGLOBIN A1C    Microalbumin / Creatinine Urine Ratio      2. DOE (dyspnea on exertion)  XR CHEST (2 VIEWS)    Brain Natriuretic Peptide    TSH  CBC with Auto Differential    Comprehensive Metabolic Panel    bumetanide (BUMEX) 1 MG tablet    potassium chloride (KLOR-CON M) 20 MEQ extended release tablet      3. Mixed hyperlipidemia  Lipid Panel      4. Chronic heart failure with preserved ejection fraction (HCC)  bumetanide (BUMEX) 1 MG tablet      5. Mild intermittent asthma with acute exacerbation  fluticasone-salmeterol (ADVAIR DISKUS) 100-50 MCG/ACT AEPB diskus inhaler        1. Type II diabetes mellitus, well controlled ([place])  Lab Results   Component Value Date/Time    HBA1CPOC 6.2 07/19/2022 08:13 AM       - Encouraged to work on lifestyle modifications including diet and exercise.  - Continue home monitoring. Glucose goals; Fasting (80-130), preprandial (<140), 2-hr post-prandial (<180)    - continue Metformin 500mg  daily  - Microalbumin / Creatinine Urine Ratio; Future    2. DOE (dyspnea on exertion)  Will get basic workup.  Discussed getting pt back to see  cards as he may need a repeat Echo. Pt would like to wait and discuss further at follow up.  We also discussed possibly getting him on Jardiance.  Will diurese with Bumex.  - XR CHEST (2 VIEWS); Future  - Brain Natriuretic Peptide; Future  - TSH; Future  - CBC with Auto Differential; Future  - Comprehensive Metabolic Panel; Future  - bumetanide (BUMEX) 1 MG tablet; Take 1 tablet by mouth daily   - potassium chloride (KLOR-CON M) 20 MEQ extended release tablet; Take 1 tablet by mouth daily     3. Mixed hyperlipidemia  - Lipid Panel; Future    4. Chronic heart failure with preserved ejection fraction (HCC)  - bumetanide (BUMEX) 1 MG tablet; Take 1 tablet by mouth daily  Dispense: 10 tablet; Refill: 0    5. Mild intermittent asthma with acute exacerbation  - fluticasone-salmeterol (ADVAIR DISKUS) 100-50 MCG/ACT AEPB diskus inhaler; Inhale 1 puff into the lungs in the morning and 1 puff in the evening.  Dispense: 1 each; Refill: 0      Follow up: 2 weeks. RTC to clinic sooner should symptoms persist, worsen or fail to improve as anticipated.    We discussed the expected course, resolution and complications of the diagnosis(es) in detail.  Medication risks, benefits, costs, interactions, and alternatives were discussed as indicated.  I advised to contact the office if his condition worsens, changes or fails to improve as anticipated. Pt expressed understanding with the diagnosis(es) and plan. Patient understands that this encounter was a temporary measure, and the importance of further follow up and examination was emphasized.  Patient verbalized understanding.      Signed By: Clearence Ped, MD     July 19, 2022

## 2022-07-20 LAB — CBC WITH AUTO DIFFERENTIAL
Basophils %: 2 % — ABNORMAL HIGH (ref 0–1)
Basophils Absolute: 0.1 10*3/uL (ref 0.0–0.1)
Eosinophils %: 9 % — ABNORMAL HIGH (ref 0–7)
Eosinophils Absolute: 0.5 10*3/uL — ABNORMAL HIGH (ref 0.0–0.4)
Hematocrit: 42.5 % (ref 36.6–50.3)
Hemoglobin: 13.6 g/dL (ref 12.1–17.0)
Immature Granulocytes %: 0 % (ref 0.0–0.5)
Immature Granulocytes Absolute: 0 10*3/uL (ref 0.00–0.04)
Lymphocytes %: 37 % (ref 12–49)
Lymphocytes Absolute: 1.9 10*3/uL (ref 0.8–3.5)
MCH: 29.3 PG (ref 26.0–34.0)
MCHC: 32 g/dL (ref 30.0–36.5)
MCV: 91.6 FL (ref 80.0–99.0)
MPV: 10.3 FL (ref 8.9–12.9)
Monocytes %: 8 % (ref 5–13)
Monocytes Absolute: 0.4 10*3/uL (ref 0.0–1.0)
Neutrophils %: 44 % (ref 32–75)
Neutrophils Absolute: 2.3 10*3/uL (ref 1.8–8.0)
Nucleated RBCs: 0 PER 100 WBC
Platelets: 278 10*3/uL (ref 150–400)
RBC: 4.64 M/uL (ref 4.10–5.70)
RDW: 12.3 % (ref 11.5–14.5)
WBC: 5.2 10*3/uL (ref 4.1–11.1)
nRBC: 0 10*3/uL (ref 0.00–0.01)

## 2022-07-20 LAB — COMPREHENSIVE METABOLIC PANEL
ALT: 38 U/L (ref 12–78)
AST: 14 U/L — ABNORMAL LOW (ref 15–37)
Albumin/Globulin Ratio: 1.3 (ref 1.1–2.2)
Albumin: 4.2 g/dL (ref 3.5–5.0)
Alk Phosphatase: 52 U/L (ref 45–117)
Anion Gap: 7 mmol/L (ref 5–15)
BUN/Creatinine Ratio: 14 (ref 12–20)
BUN: 12 MG/DL (ref 6–20)
CO2: 28 mmol/L (ref 21–32)
Calcium: 9.4 MG/DL (ref 8.5–10.1)
Chloride: 104 mmol/L (ref 97–108)
Creatinine: 0.85 MG/DL (ref 0.70–1.30)
Est, Glom Filt Rate: >60 mL/min/{1.73_m2} (ref >60)
Globulin: 3.2 g/dL (ref 2.0–4.0)
Glucose: 123 mg/dL — ABNORMAL HIGH (ref 65–100)
Potassium: 3.9 mmol/L (ref 3.5–5.1)
Sodium: 139 mmol/L (ref 136–145)
Total Bilirubin: 0.7 MG/DL (ref 0.2–1.0)
Total Protein: 7.4 g/dL (ref 6.4–8.2)

## 2022-07-20 LAB — LIPID PANEL
Chol/HDL Ratio: 2.8 (ref 0.0–5.0)
Cholesterol, Total: 165 MG/DL (ref <200)
HDL: 59 MG/DL
LDL Calculated: 78.4 MG/DL (ref 0–100)
Triglycerides: 138 MG/DL (ref <150)
VLDL Cholesterol Calculated: 27.6 MG/DL

## 2022-07-20 LAB — TSH: TSH, 3rd Generation: 0.48 u[IU]/mL (ref 0.36–3.74)

## 2022-07-20 LAB — BRAIN NATRIURETIC PEPTIDE: NT Pro-BNP: 34 PG/ML (ref <125)

## 2022-07-20 LAB — MICROALBUMIN / CREATININE URINE RATIO
Creatinine, Ur: 37.5 mg/dL
Microalb, Ur: 0.53 MG/DL
Microalb/Creat Ratio: 14 mg/g (ref 0–30)

## 2022-08-05 ENCOUNTER — Ambulatory Visit
Admit: 2022-08-05 | Discharge: 2022-08-05 | Payer: BLUE CROSS/BLUE SHIELD | Attending: Family Medicine | Primary: Family Medicine

## 2022-08-05 DIAGNOSIS — J4521 Mild intermittent asthma with (acute) exacerbation: Secondary | ICD-10-CM

## 2022-08-05 MED ORDER — FLUTICASONE-SALMETEROL 100-50 MCG/ACT IN AEPB
100-50 MCG/ACT | Freq: Two times a day (BID) | RESPIRATORY_TRACT | 5 refills | Status: DC
Start: 2022-08-05 — End: 2022-11-21

## 2022-08-05 NOTE — Progress Notes (Signed)
[facility]  [phone] [address]  [phone]    Chief Complaint: chronic medical problems    Subjective  Jason Greene is a 52 y.o. Black / [place] American male , established patient, here for evaluation of the concern(s) above;    PMHx: DM II, ED, HFpEF (last Echo 05/2020- not following with cards), OSA on CPAP  and chronic cough here for routine follow up;    Chronic cough/Asthma and CHF:    States that Advair have been very helpful.  Still gets a little winded.    Of note, pt has had several episodes of bronchitis toward the end of last year. Has been treated with several courses of abx, prednisone, cough suppressants and albuterol. Symptoms have gotten better but still gets winded especially with exertion. Unable to sleep flat at night. Denies any leg swelling.    Has h/o HFpEF (last echo 05/2020) which he said he was not aware of - LVEF 60-65%. Per chart review, he dr. Harrell Gave End (with [facility]  in [place]) and ha also seen pulm at that time.     Has OSA on CPAP which helps.    Has h/o asthma for which he uses albuterol.     Diabetes:  Diagnosed with borderline diabetes (A1C 6.6%) in 11/2019.  On metformin 561m daily.  Tolerating well without any side effects.    ED:  On sildenafil but states that he hate taking it only when needed. Would like something he can take daily.     Pertinent negatives include  no chest pain, no abdominal pain, fever or chills.    Social History:  -Non-smoker       Allergies - reviewed:   Allergies   Allergen Reactions    Lisinopril Cough       Past Medical History - reviewed:  Past Medical History:   Diagnosis Date    Asthma     Diabetes ([place]     Erectile dysfunction     GERD (gastroesophageal reflux disease)     High cholesterol     Hypertension     Obesity     OSA (obstructive sleep apnea)        Depression screening:  No data recorded      Review of systems:   A comprehensive review of systems was  negative except for that written in the History of Present Illness.     Physical Exam  BP 125/73 (Site: Left Upper Arm, Position: Sitting, Cuff Size: Medium Adult)   Pulse 78   Temp 97.7 F (36.5 C) (Tympanic)   Resp 17   Ht 1.803 m (5' 11"$ )   Wt 121.2 kg (267 lb 3.2 oz)   SpO2 96%   BMI 37.27 kg/m     General: Alert and oriented, in no acute distress.  LUNGS: Respirations unlabored; clear to auscultation bilaterally.  CARDIOVASCULAR: Regular, rate, and rhythm without murmurs, gallops or rubs.  ABDOMEN: Soft; nontender; nondistended; normoactive bowel sounds; no masses or organomegaly.  MUSCULOSKELETAL: FROM in all extremities     EXT: No edema. Neurovascularlly intact. Normal gait.  Neurological: Alert and oriented X 3, normal strength and tone. Normal symmetric reflexes. Normal coordination and gait     Assessment/Plan   Diagnosis Orders   1. DOE (dyspnea on exertion)  AFL - CBrion Aliment MD, Structural Heart, [place]      2. Mild intermittent asthma with acute exacerbation  fluticasone-salmeterol (ADVAIR  DISKUS) 100-50 MCG/ACT AEPB diskus inhaler        1. Mild intermittent asthma with acute exacerbation  -continue albuterol prn  - fluticasone-salmeterol (ADVAIR DISKUS) 100-50 MCG/ACT AEPB diskus inhaler; Inhale 1 puff into the lungs in the morning and 1 puff in the evening.  Dispense: 1 each; Refill: 5    2. DOE (dyspnea on exertion)  Will recommend seeing cards, may need another stress test.  - AFL - Chaudhry, Inda Merlin, MD, Structural Heart, [place]      Follow up: 3 months for yearly physical. RTC to clinic sooner should symptoms persist, worsen or fail to improve as anticipated.    We discussed the expected course, resolution and complications of the diagnosis(es) in detail.  Medication risks, benefits, costs, interactions, and alternatives were discussed as indicated.  I advised to contact the office if his condition worsens, changes or fails to improve as anticipated. Pt  expressed understanding with the diagnosis(es) and plan. Patient understands that this encounter was a temporary measure, and the importance of further follow up and examination was emphasized.  Patient verbalized understanding.      Signed By: Clearence Ped, MD     August 05, 2022

## 2022-08-05 NOTE — Telephone Encounter (Addendum)
Brook with [facility] is requesting the referral and office note she can be reached @ [phone] and ( f [phone]

## 2022-08-05 NOTE — Telephone Encounter (Signed)
Faxed as requested

## 2022-08-05 NOTE — Progress Notes (Signed)
Chief Complaint   Patient presents with    Follow-up     07/19/2022 Diabetes  Last A1C 6.2%        Patient was here less than a month ago for Diabetic F/U.    "Have you been to the ER, urgent care clinic since your last visit?  Hospitalized since your last visit?"    NO    "Have you seen or consulted any other health care providers outside of [facility] since your last visit?"    NO           Vitals:    08/05/22 0821   BP: 125/73   Pulse: 78   Resp: 17   Temp: 97.7 F (36.5 C)   SpO2: 96%     Health Maintenance Due   Topic Date Due    Hepatitis B vaccine (1 of 3 - 3-dose series) Never done    Pneumococcal 0-64 years Vaccine (1 - PCV) Never done    HIV screen  Never done    Diabetic retinal exam  Never done    Hepatitis C screen  Never done    DTaP/Tdap/Td vaccine (1 - Tdap) 01/30/2018    Flu vaccine (1) 01/18/2022    COVID-19 Vaccine (5 - 2023-24 season) 02/18/2022      The patient, Jason Greene, identity was verified by name and DOB, pharmacy verified  Labs:Yes  Fasting:No

## 2022-08-12 ENCOUNTER — Telehealth

## 2022-08-12 NOTE — Telephone Encounter (Signed)
Patient states that he would like to get a referral to see a pulmonary doctor for his asthma he can be reached @ [phone]

## 2022-08-16 NOTE — Telephone Encounter (Signed)
Verbal Order  give with [facility] for pulmonology referral  per Dr Hoover Brunette for asthma management

## 2022-09-13 ENCOUNTER — Encounter

## 2022-09-14 MED ORDER — LEVALBUTEROL TARTRATE 45 MCG/ACT IN AERO
45 | Freq: Four times a day (QID) | RESPIRATORY_TRACT | 3 refills | Status: DC | PRN
Start: 2022-09-14 — End: 2023-12-27

## 2022-11-11 ENCOUNTER — Ambulatory Visit
Admit: 2022-11-11 | Discharge: 2022-11-11 | Payer: BLUE CROSS/BLUE SHIELD | Attending: Family Medicine | Primary: Family Medicine

## 2022-11-11 DIAGNOSIS — R053 Chronic cough: Secondary | ICD-10-CM

## 2022-11-11 MED ORDER — TRELEGY ELLIPTA 100-62.5-25 MCG/ACT IN AEPB
Freq: Every day | RESPIRATORY_TRACT | 0 refills | Status: DC
Start: 2022-11-11 — End: 2023-01-17

## 2022-11-11 MED ORDER — TRELEGY ELLIPTA 100-62.5-25 MCG/ACT IN AEPB
Freq: Every day | RESPIRATORY_TRACT | 0 refills | Status: DC
Start: 2022-11-11 — End: 2022-11-11

## 2022-11-11 MED ORDER — PREDNISONE 5 MG PO TABS
5 | ORAL_TABLET | Freq: Every day | ORAL | 0 refills | Status: AC
Start: 2022-11-11 — End: 2022-12-11

## 2022-11-11 NOTE — Progress Notes (Signed)
Chief Complaint   Patient presents with    Follow-up     3 Months 08/05/2022 Mild intermittent asthma with acute exacerbation     "Have you been to the ER, urgent care clinic since your last visit?  Hospitalized since your last visit?"    NO    "Have you seen or consulted any other health care providers outside of [facility] since your last visit?"    NO           Vitals:    11/11/22 0817   BP: 115/78   Pulse: 87   Resp: 17   Temp: 97.8 F (36.6 C)   SpO2: 94%     Health Maintenance Due   Topic Date Due    Hepatitis B vaccine (1 of 3 - 3-dose series) Never done    Pneumococcal 0-64 years Vaccine (1 of 2 - PCV) Never done    HIV screen  Never done    Diabetic retinal exam  Never done    Hepatitis C screen  Never done    DTaP/Tdap/Td vaccine (1 - Tdap) 01/30/2018    COVID-19 Vaccine (5 - 2023-24 season) 02/18/2022      The patient, Jason Greene, identity was verified by name and DOB, pharmacy verified  Labs:Yes  Fasting:Yes

## 2022-11-11 NOTE — Telephone Encounter (Signed)
Patient is calling back to schedule when they get home and check scheule    Referral in wq    NP [facility], andre enowtaku, bcbs, notes in cc

## 2022-11-11 NOTE — Progress Notes (Signed)
[facility]  [facility]  [phone]. [address]  [place], [place] [address]  [phone]    Chief Complaint: chronic medical problems    Subjective  Jason Greene is a 52 y.o. Black / [place] American male , established patient, here for evaluation of the concern(s) above;    PMHx: DM II, ED, HFpEF (last Echo 05/2020- not following with cards), inguinal hernia, OSA on CPAP  and chronic cough here for routine follow up;    Chronic cough/Asthma and CHF:  This is ongoing since 2022 per chart review. Notes also show pt has seen pulm in [place] in the past for similar problem (Dr. Coralyn Helling) and underwent extensive workup.  Workup here including cardiac workup has been unremarkable thus far. He has not yet seen pulm.  I started on Advair which helped initially but cough came back.    Of note, pt has had several episodes of bronchitis toward the end of last year (2023). Has been treated with several courses of abx, prednisone, cough suppressants and albuterol. Symptoms have gotten better but still gets winded especially with exertion. Unable to sleep flat at night. Denies any leg swelling.    Has h/o HFpEF (last echo 05/2020) which he said he was not aware of - LVEF 60-65%. Per chart review, he dr. Cristal Deer End (with [facility]  in [place]) and ha also seen pulm at that time.     Has OSA on CPAP which helps.    Inguinal hernia, left s/p repair:  States that he is starting to feel symptoms again especially worst with his constant cough.     Diabetes:  Diagnosed with borderline diabetes (A1C 6.6%) in 11/2019.  On metformin 500mg  daily.  Tolerating well without any side effects.    ED:  On sildenafil but states that he hate taking it only when needed. Would like something he can take daily.     Pertinent negatives include  no chest pain, no abdominal pain, fever or chills.    Social History:  -Non-smoker       Allergies - reviewed:   Allergies   Allergen Reactions    Lisinopril Cough       Past  Medical History - reviewed:  Past Medical History:   Diagnosis Date    Asthma     Diabetes (HCC)     Erectile dysfunction     GERD (gastroesophageal reflux disease)     High cholesterol     Hypertension     Obesity     OSA (obstructive sleep apnea)        Depression screening:  No data recorded      Review of systems:   A comprehensive review of systems was negative except for that written in the History of Present Illness.     Physical Exam  BP 115/78   Pulse 87   Temp 97.8 F (36.6 C) (Tympanic)   Resp 17   Ht 1.753 m (5\' 9" )   Wt 120 kg (264 lb 8.8 oz)   SpO2 94%   BMI 39.07 kg/m     General: Alert and oriented, in no acute distress.  LUNGS: Respirations unlabored; clear to auscultation bilaterally.  CARDIOVASCULAR: Regular, rate, and rhythm without murmurs, gallops or rubs.  ABDOMEN: Soft; nontender; nondistended; normoactive bowel sounds; no masses or organomegaly.  MUSCULOSKELETAL: FROM in all extremities     EXT: No edema. Neurovascularlly intact. Normal gait.  Neurological: Alert and oriented X 3, normal  strength and tone. Normal symmetric reflexes. Normal coordination and gait.  GU - deferred per pt     Assessment/Plan   Diagnosis Orders   1. Chronic cough  AFL - Schenkein, Riki Rusk, MD, Pulmonology, [place]    predniSONE (DELTASONE) 5 MG tablet    fluticasone-umeclidin-vilant (TRELEGY ELLIPTA) 100-62.5-25 MCG/ACT AEPB inhaler    DISCONTINUED: fluticasone-umeclidin-vilant (TRELEGY ELLIPTA) 100-62.5-25 MCG/ACT AEPB inhaler      2. Moderate persistent asthma, unspecified whether complicated  predniSONE (DELTASONE) 5 MG tablet    fluticasone-umeclidin-vilant (TRELEGY ELLIPTA) 100-62.5-25 MCG/ACT AEPB inhaler    DISCONTINUED: fluticasone-umeclidin-vilant (TRELEGY ELLIPTA) 100-62.5-25 MCG/ACT AEPB inhaler      3. Left inguinal hernia  [facility] - Doralee Albino, MD, General Surgery, [place]        1. Chronic cough  Will try Trelegy and small dose of daily prednisone with referral to pulm.  - AFL -  Charlynn Court, MD, Pulmonology, [place]  - predniSONE (DELTASONE) 5 MG tablet; Take 1 tablet by mouth daily  Dispense: 30 tablet; Refill: 0  - fluticasone-umeclidin-vilant (TRELEGY ELLIPTA) 100-62.5-25 MCG/ACT AEPB inhaler; Inhale 1 puff into the lungs daily  Dispense: 60 each; Refill: 0    2. Moderate persistent asthma, unspecified whether complicated  - predniSONE (DELTASONE) 5 MG tablet; Take 1 tablet by mouth daily  Dispense: 30 tablet; Refill: 0  - fluticasone-umeclidin-vilant (TRELEGY ELLIPTA) 100-62.5-25 MCG/ACT AEPB inhaler; Inhale 1 puff into the lungs daily  Dispense: 60 each; Refill: 0    3. Left inguinal hernia    - [facility] - Doralee Albino, MD, General Surgery, [place]    - Warning signs for incarceration and strangulation discussed with patient. Advised to go to ER immediately should he experience any    Follow up: 3 months. RTC to clinic sooner should symptoms persist, worsen or fail to improve as anticipated.    We discussed the expected course, resolution and complications of the diagnosis(es) in detail.  Medication risks, benefits, costs, interactions, and alternatives were discussed as indicated.  I advised to contact the office if his condition worsens, changes or fails to improve as anticipated. Pt expressed understanding with the diagnosis(es) and plan. Patient understands that this encounter was a temporary measure, and the importance of further follow up and examination was emphasized.  Patient verbalized understanding.      Signed By: Rene Paci, MD     Nov 11, 2022

## 2022-11-15 NOTE — Telephone Encounter (Signed)
Patient is calling back to schedule when they get home and check scheule    Referral in wq    NP [facility], andre enowtaku, bcbs, notes in cc

## 2022-11-21 ENCOUNTER — Ambulatory Visit
Admit: 2022-11-21 | Discharge: 2022-11-21 | Payer: BLUE CROSS/BLUE SHIELD | Attending: Surgery | Primary: Family Medicine

## 2022-11-21 DIAGNOSIS — K4091 Unilateral inguinal hernia, without obstruction or gangrene, recurrent: Secondary | ICD-10-CM

## 2022-11-21 NOTE — Progress Notes (Signed)
Identified pt with two pt identifiers (name and DOB). Reviewed chart in preparation for visit and have obtained necessary documentation.    Orlandus Robledo is a 52 y.o. male  Chief Complaint   Patient presents with    New Patient     Seen at the request of Otila Kluver, MD for the evaluation of a possible left inguinal hernia.      BP 129/85 (Site: Right Upper Arm, Position: Sitting)   Pulse 77   Temp 97.9 F (36.6 C) (Oral)   Resp 16   Ht 1.753 m (5\' 9" )   Wt 120.9 kg (266 lb 9.6 oz)   SpO2 93%   BMI 39.37 kg/m     1. Have you been to the ER, urgent care clinic since your last visit?  Hospitalized since your last visit?no    2. Have you seen or consulted any other health care providers outside of the [facility] since your last visit?  Include any pap smears or colon screening. Yes, [facility] in [facility] for previous hernia repair about 2017 or 2019.

## 2022-11-21 NOTE — Progress Notes (Signed)
Jason Greene (DOB:  1971-03-20) is a 52 y.o. male, here for evaluation of the following chief complaint(s):  New Patient (Seen at the request of Otila Kluver, MD for the evaluation of a possible left inguinal hernia. )      Assessment & Plan   ASSESSMENT/PLAN:  1. Recurrent inguinal hernia without obstruction or gangrene, unspecified laterality  -     CT ABDOMEN PELVIS W IV CONTRAST Additional Contrast? Oral; Future      He had a lap chole in the past without difficulty. But apparently had trouble maintaining his sats the last time he had attempted laparoscopic inguinal hernia repair.  With the apparent scar tissue in his left groin try to do a repeat open inguinal hernia repair on the left side would be difficult.  We ideally want to do this laparoscopically/robotically.      Markjoseph Chee is having symptoms.  I have recommended to him that we proceed with surgery.    I had an extensive discussion with him regarding the risks, benefits, and alternatives of proceeding with a Laparoscopic recurrent left, Possible Bilateral Inguinal Hernia Repair with Mesh, Robot Assisted.  Risks,benefits, and alternatives were discussed including the risk of anesthesia, bleeding, infection, including mesh infection, chronic orchialgia, neuralgia, other pain syndromes, testicular ischemia, conversion to open, injury to bowel, and recurrence were discussed.    Will get a CT scan to evaluate his anatomy.  We will await a risk assessment with pulmonology.  Provide he is an acceptable risk, proceed with surgery.    He expressed understanding of her discussion and is agreeable with the plan.    Thank you for this consult.       Subjective   HPI:  HPI      In 2017 he underwent a laparoscopic recurrent left inguinal hernia repair.  The procedure had to be aborted due to him desatting.  He subsequently underwent an open left inguinal hernia repair.  Although apparently he had some intense scarring in the groin and at least 1 additional  fascial defect was closed with Ethibond sutures.  Bard mesh was used with Prolene suture in the groin.    A few months after surgery, pain started coming back  Now protruding more  No nausea  BMs irregular    On steroids for a chronic cough    Scheduled to see pulmonology next week          Review of Systems   Constitutional:  Negative for chills, fever and unexpected weight change.   HENT:  Negative for ear pain.    Eyes:  Negative for pain.   Respiratory:  Negative for shortness of breath.    Cardiovascular:  Negative for chest pain.   Gastrointestinal:  Negative for blood in stool.   Genitourinary:  Negative for hematuria.   Musculoskeletal:  Negative for arthralgias.   Skin:  Negative for rash.   Neurological:  Negative for dizziness, seizures, weakness and headaches.   Hematological:  Does not bruise/bleed easily.   Psychiatric/Behavioral:  Negative for confusion and sleep disturbance.             Objective   Physical Exam  Constitutional:       General: He is not in acute distress.     Appearance: He is well-developed. He is obese. He is not diaphoretic.   HENT:      Head: Normocephalic and atraumatic.      Mouth/Throat:      Pharynx: No oropharyngeal exudate.  Eyes:      General: No scleral icterus.     Pupils: Pupils are equal, round, and reactive to light.   Neck:      Trachea: No tracheal deviation.   Cardiovascular:      Rate and Rhythm: Normal rate and regular rhythm.      Heart sounds: Normal heart sounds. No murmur heard.  Pulmonary:      Effort: Pulmonary effort is normal. No respiratory distress.      Breath sounds: No wheezing.   Abdominal:      General: Bowel sounds are normal. There is no distension.      Palpations: Abdomen is soft. There is no mass.      Tenderness: There is abdominal tenderness. There is no guarding or rebound.      Hernia: A hernia is present. Hernia is present in the left inguinal area.      Comments: Mild tenderness left groin.  No acute skin changes   Musculoskeletal:          General: No tenderness. Normal range of motion.      Cervical back: Normal range of motion and neck supple.   Lymphadenopathy:      Cervical: No cervical adenopathy.   Skin:     General: Skin is warm.      Findings: No erythema or rash.   Neurological:      Mental Status: He is alert and oriented to person, place, and time.   Psychiatric:         Behavior: Behavior normal.                  I had an extensive and thorough discussion with Jason Greene regarding current diagnosis and treatment recommendations. Total time spend with him was 60 minutes.  This included the following:  preparing to see the patient (reviewing prior records and tests),  performing a medically appropriate examination and/or evaluation,  counseling and educating the patient/family/caregiver,  documenting clinical information in the electronic or other health record,  independently interpreting results and communicating results to the patient/family/caregiver,  ordering medications, tests, or procedures,  care coordination       --Doralee Albino, MD

## 2022-12-01 ENCOUNTER — Ambulatory Visit: Payer: BLUE CROSS/BLUE SHIELD | Primary: Family Medicine

## 2022-12-02 NOTE — Telephone Encounter (Signed)
Spoke with patient and he said he cancelled his CT scan because he was out of town. He said he went to see the Pulmonologist and he said that he would like for the patient to hold off on any procedure or surgery until he is finished treating him. Ms Salz said he would call me back when he has scheduled his CT.

## 2023-01-17 ENCOUNTER — Encounter

## 2023-01-17 MED ORDER — TRELEGY ELLIPTA 100-62.5-25 MCG/ACT IN AEPB
RESPIRATORY_TRACT | 5 refills | Status: AC
Start: 2023-01-17 — End: ?

## 2023-01-17 NOTE — Telephone Encounter (Signed)
Last appointment: 11/11/22  Next appointment: Advised to follow-up 02/11/23  Previous refill encounter(s): 11/11/22 #60    Requested Prescriptions     Pending Prescriptions Disp Refills    TRELEGY ELLIPTA 100-62.5-25 MCG/ACT AEPB inhaler [Pharmacy Med Name: TRELEGY ELLIPTA 100-62.5-25] 60 each 0     Sig: TAKE 1 PUFF BY MOUTH EVERY DAY         For Pharmacy Admin Tracking Only    Program: Medication Refill  CPA in place:    Recommendation Provided To:   Intervention Detail: New Rx: 1, reason: Patient Preference  Intervention Accepted By:   Eugenia Pancoast Closed?:    Time Spent (min): 5

## 2023-01-24 ENCOUNTER — Encounter: Payer: BLUE CROSS/BLUE SHIELD | Primary: Family Medicine

## 2023-02-14 NOTE — Telephone Encounter (Signed)
 Last appointment: 11/11/22  Next appointment: 04/10/23  Previous refill encounter(s): 01/25/22    Requested Prescriptions     Pending Prescriptions Disp Refills    atorvastatin  (LIPITOR) 20 MG tablet 90 tablet 3     Sig: Take 1 tablet by mouth every evening         For Pharmacy Admin Tracking Only    Program: Medication Refill  CPA in place:    Recommendation Provided To:   Intervention Detail: New Rx: 1, reason: Patient Preference  Intervention Accepted By:   Joesph Closed?:    Time Spent (min): 5

## 2023-02-15 MED ORDER — ATORVASTATIN CALCIUM 20 MG PO TABS
20 | ORAL_TABLET | Freq: Every evening | ORAL | 3 refills | Status: DC
Start: 2023-02-15 — End: 2023-07-07

## 2023-03-08 MED ORDER — METOPROLOL SUCCINATE ER 50 MG PO TB24
50 | ORAL_TABLET | Freq: Every day | ORAL | 3 refills | Status: DC
Start: 2023-03-08 — End: 2023-07-07

## 2023-03-08 NOTE — Telephone Encounter (Signed)
 Last appointment: 11/11/22  Next appointment: 04/10/23  Previous refill encounter(s): 01/25/22    Requested Prescriptions     Pending Prescriptions Disp Refills    metoprolol  succinate (TOPROL  XL) 50 MG extended release tablet 90 tablet 3     Sig: Take 1 tablet by mouth daily After a meal         For Pharmacy Admin Tracking Only    Program: Medication Refill  CPA in place:    Recommendation Provided To:   Intervention Detail: New Rx: 1, reason: Patient Preference  Intervention Accepted By:   Joesph Closed?:    Time Spent (min): 5

## 2023-03-08 NOTE — Telephone Encounter (Signed)
 Last appointment: 11/11/22  Next appointment: 04/10/23  Previous refill encounter(s): 01/25/22    Requested Prescriptions     Pending Prescriptions Disp Refills    metFORMIN  (GLUCOPHAGE ) 500 MG tablet [Pharmacy Med Name: METFORMIN  HCL 500 MG TABLET] 90 tablet 3     Sig: TAKE 1 TABLET BY MOUTH EVERY DAY           For Pharmacy Admin Tracking Only    Program: Medication Refill  CPA in place:    Recommendation Provided To:   Intervention Detail: New Rx: 1, reason: Patient Preference  Intervention Accepted By:   Joesph Closed?:    Time Spent (min): 5

## 2023-03-08 NOTE — Telephone Encounter (Signed)
 Duplicate    For Pharmacy Admin Tracking Only    Program: Medication Refill  CPA in place:    Recommendation Provided To:   Intervention Detail: Discontinued Rx: 1, reason: Duplicate Therapy  Intervention Accepted By:   Eugenia Pancoast Closed?:    Time Spent (min): 5

## 2023-03-09 MED ORDER — METFORMIN HCL 500 MG PO TABS
500 | ORAL_TABLET | Freq: Every day | ORAL | 3 refills | Status: DC
Start: 2023-03-09 — End: 2023-07-07

## 2023-03-14 ENCOUNTER — Telehealth
Admit: 2023-03-14 | Discharge: 2023-03-14 | Payer: BLUE CROSS/BLUE SHIELD | Attending: Family Medicine | Primary: Family Medicine

## 2023-03-14 DIAGNOSIS — J069 Acute upper respiratory infection, unspecified: Secondary | ICD-10-CM

## 2023-03-14 MED ORDER — AZITHROMYCIN 250 MG PO TABS
250 | ORAL_TABLET | ORAL | 0 refills | Status: AC
Start: 2023-03-14 — End: 2023-03-24

## 2023-03-14 MED ORDER — HYDROCOD POLI-CHLORPHE POLI ER 10-8 MG/5ML PO SUER
10-8 | Freq: Two times a day (BID) | ORAL | 0 refills | Status: DC | PRN
Start: 2023-03-14 — End: 2023-04-28

## 2023-03-14 MED ORDER — ONDANSETRON 8 MG PO TBDP
8 MG | ORAL_TABLET | Freq: Three times a day (TID) | ORAL | 0 refills | Status: DC | PRN
Start: 2023-03-14 — End: 2023-11-23

## 2023-03-14 NOTE — Assessment & Plan Note (Signed)
 Recommend getting home covid test and call back if positive for paxlovid.  - Continue conservative measures; Tylenol/NSAIDs for pain,  Mucinex DM  twice daily or Tessalon for cough, Flonase/saline rinse for congestion  - Discussed precautions with patient.

## 2023-03-14 NOTE — Assessment & Plan Note (Signed)
 Will treat empirically given concerns for otitis media.  Recommend follow up if symptoms fail to improve

## 2023-03-14 NOTE — Telephone Encounter (Signed)
 Patient needs a call back at [phone] coughing thru entire phone call, complaining of sore throat, nausea, vomiting that started Monday. Patient would like a virtual visit if possible or prescriptions for cough, nausea and vomiting sent to [facility] [phone].

## 2023-03-14 NOTE — Telephone Encounter (Signed)
 Scheduled Virtual for 03/14/2023 @ 2pm

## 2023-03-14 NOTE — Progress Notes (Signed)
 Chief Complaint   Patient presents with    Cough    Nausea & Vomiting     X 2 days     Patient stated that he has had this Chronic cough for a long time. Symptoms started Monday 03/13/2023 complaining of sore throat, nausea, vomiting. Requesting prescriptions for cough, nausea and vomiting. Has not completed a Home COVID Test.    Have you been to the ER, urgent care clinic since your last visit?  Hospitalized since your last visit?    NO    "Have you seen or consulted any other health care providers outside of [facility] since your last visit?"    NO      There were no vitals filed for this visit.   Health Maintenance Due   Topic Date Due    Pneumococcal 0-64 years Vaccine (1 of 2 - PCV) Never done    HIV screen  Never done    Diabetic retinal exam  Never done    Hepatitis C screen  Never done    Hepatitis B vaccine (1 of 3 - 19+ 3-dose series) Never done    DTaP/Tdap/Td vaccine (1 - Tdap) 01/30/2018    Flu vaccine (1) 01/19/2023    Diabetic foot exam  02/05/2023    COVID-19 Vaccine (5 - 2023-24 season) 02/19/2023        The patient, Jason Greene, identity was verified by name and DOB.

## 2023-03-14 NOTE — Progress Notes (Signed)
 [place] [facility]  [facility]  [phone]  [place], [place] [address]  [phone]    Chief Complaint: Acute URI    Subjective  Jason Greene is a 52 y.o. Black / African American male , established patient, here for evaluation of the concern(s) above;    SUBJECTIVE:     Pt would like to be evaluated for the problem(s) listed above:    Pt with h/o chronic cough currently being worked up by pulm has been experience worsening cough, with associated nausea over the past 2 days. States that he has also experienced left ear pain over the past 3 weeks and has not been able to get an appointment.  Denies any fever, chest pain or sob.   Has not checked for covid.    Has also seen allergist for the chronic cough.     Review of systems:       A comprehensive review of systems was negative except for that written in the History of Present Illness.         PHYSICAL EXAM:     General: alert, cooperative, no distress   Mental  status: mental status: alert, oriented to person, place, and time, normal mood, behavior, speech, dress, motor activity, and thought processes   Resp: resp: normal effort and no respiratory distress   Neuro: neuro: no gross deficits   Skin: skin: no discoloration or lesions of concern on visible areas   Due to this being a TeleHealth evaluation, many elements of the physical examination are unable to be assessed.        ASSESSMENT/PLAN:     1. Acute upper respiratory infection  Assessment & Plan:  Recommend getting home covid test and call back if positive for paxlovid.  - Continue conservative measures; Tylenol /NSAIDs for pain,  Mucinex  DM  twice daily or Tessalon  for cough, Flonase/saline rinse for congestion  - Discussed precautions with patient.  Orders:  -     HYDROcodone-chlorpheniramine (TUSSIONEX) 10-8 MG/5ML SUER; Take 5 mLs by mouth every 12 hours as needed (cough) for up to 10 days. Max Daily Amount: 10 mLs, Disp-115 mL, R-0Normal  -     ondansetron  (ZOFRAN -ODT) 8  MG TBDP disintegrating tablet; Take 1 tablet by mouth every 8 hours as needed for Nausea or Vomiting, Disp-20 tablet, R-0Normal  2. Left ear pain  Assessment & Plan:  Will treat empirically given concerns for otitis media.  Recommend follow up if symptoms fail to improve  Orders:  -     azithromycin  (ZITHROMAX ) 250 MG tablet; 500mg  on day 1 followed by 250mg  on days 2 - 5, Disp-6 tablet, R-0Normal      FOLLOW UP: as needed    On this date 03/14/23 I have spent 20 minutes reviewing previous notes, test results and virtual with the patient discussing the diagnosis and importance of compliance with the treatment plan as well as documenting on the day of the visit.       We discussed the expected course, resolution and complications of the diagnosis(es) in detail. Patient was in [place]  at the time of consultation. Medication risks, benefits, costs, interactions, and alternatives were discussed as indicated.  I advised her to contact the office if her condition worsens, changes or fails to improve as anticipated. She expressed understanding with the diagnosis(es) and plan. Patient understands that this encounter was a temporary measure, and the importance of further follow up and examination was emphasized.  Patient verbalized understanding.  Jason Greene is being evaluated by a Virtual Visit (video visit) encounter to address concerns as mentioned above.  A caregiver was present when appropriate. Due to this being a Scientist, Research (medical) (During COVID-19 public health emergency), evaluation of the following organ systems was limited: Vitals/Constitutional/EENT/Resp/CV/GI/GU/MS/Neuro/Skin/Heme-Lymph-Imm.  Pursuant to the emergency declaration under the [facility] Act and the [employer], 1135 waiver authority and the [employer] Act, this Virtual Visit was conducted with patient's (and/or legal guardian's) consent, to reduce the patient's risk  of exposure to COVID-19 and provide necessary medical care.  The patient (and/or legal guardian) has also been advised to contact this office for worsening conditions or problems, and seek emergency medical treatment and/or call 911 if deemed necessary.     Patient identification was verified at the start of the visit: YES     Services were provided through a video synchronous discussion virtually to substitute for in-person clinic visit. Patient was located at home and provider was located in office or at home.      An electronic signature was used to authenticate this note.     -- Darin Inetta Bumps, MD      CPT Codes [phone] for Established Patients may apply to this Telehealth Visit.  POS code: 12.  Modifier GT

## 2023-03-28 MED ORDER — VALSARTAN-HYDROCHLOROTHIAZIDE 160-25 MG PO TABS
160-25 MG | ORAL_TABLET | Freq: Every day | ORAL | 3 refills | Status: AC
Start: 2023-03-28 — End: 2023-07-07

## 2023-03-28 NOTE — Telephone Encounter (Signed)
Last appointment: 03/14/23  Next appointment: 04/10/23  Previous refill encounter(s): 01/25/22    Requested Prescriptions     Pending Prescriptions Disp Refills    valsartan-hydroCHLOROthiazide (DIOVAN-HCT) 160-25 MG per tablet 90 tablet 3     Sig: Take 1 tablet by mouth daily         For Pharmacy Admin Tracking Only    Program: Medication Refill  CPA in place:    Recommendation Provided To:   Intervention Detail: New Rx: 1, reason: Patient Preference  Intervention Accepted By:   Eugenia Pancoast Closed?:    Time Spent (min): 5

## 2023-04-05 MED ORDER — PANTOPRAZOLE SODIUM 40 MG PO TBEC
40 MG | ORAL_TABLET | Freq: Every morning | ORAL | 3 refills | Status: AC
Start: 2023-04-05 — End: 2023-07-18

## 2023-04-05 NOTE — Telephone Encounter (Signed)
Last appointment: 03/14/23, 11/11/22  Next appointment: 04/10/23  Previous refill encounter(s): 01/25/22    Requested Prescriptions     Pending Prescriptions Disp Refills    pantoprazole (PROTONIX) 40 MG tablet 90 tablet 3     Sig: Take 1 tablet by mouth every morning         For Pharmacy Admin Tracking Only    Program: Medication Refill  CPA in place:    Recommendation Provided To:   Intervention Detail: New Rx: 1, reason: Patient Preference  Intervention Accepted By:   Eugenia Pancoast Closed?:    Time Spent (min): 5

## 2023-04-10 ENCOUNTER — Ambulatory Visit
Admit: 2023-04-10 | Discharge: 2023-04-10 | Payer: BLUE CROSS/BLUE SHIELD | Attending: Family Medicine | Primary: Family Medicine

## 2023-04-10 ENCOUNTER — Encounter

## 2023-04-10 DIAGNOSIS — Z0000 Encounter for general adult medical examination without abnormal findings: Secondary | ICD-10-CM

## 2023-04-10 MED ORDER — OFLOXACIN 0.3 % OT SOLN
0.3 | Freq: Two times a day (BID) | OTIC | 0 refills | Status: AC
Start: 2023-04-10 — End: 2023-04-20

## 2023-04-10 MED ORDER — METHOCARBAMOL 750 MG PO TABS
750 | ORAL_TABLET | Freq: Three times a day (TID) | ORAL | 0 refills | Status: AC | PRN
Start: 2023-04-10 — End: 2023-05-10

## 2023-04-10 MED ORDER — MELOXICAM 15 MG PO TABS
15 MG | ORAL_TABLET | Freq: Every day | ORAL | 0 refills | Status: DC | PRN
Start: 2023-04-10 — End: 2023-06-13

## 2023-04-10 NOTE — Assessment & Plan Note (Signed)
Was able to remove the cotton bulb in ear with flushing. Sending short course of ofloxacin to pharmacy

## 2023-04-10 NOTE — Progress Notes (Signed)
[place] [employer] [facility]  [facility]  [phone]  [place], [place] [address]  [phone]    C/C: Complete physical    Subjective:    Jason Greene is a 52 y.o. male who presents to clinic today for annual physical exam.      PMHx: DM II, ED, HFpEF (last Echo 05/2020- not following with cards), inguinal hernia, OSA on CPAP  and chronic cough here for routine follow up;    Left ear discomfort:  States that he used qtip and left ear has been painful for about a week.     Chronic cough/Asthma and CHF:  This is ongoing since 2022 per chart review. Notes also show pt has seen pulm in [place] in the past for similar problem (Dr. Coralyn Helling) and underwent extensive workup.  Workup here including cardiac workup has been unremarkable thus far.     I started pt on Trelegy which have not helped much.    Now sees pulm and scheduled for further workup.  States that they have him on Pepcid and Singulair.      Of note, pt has had several episodes of bronchitis toward the end of last year (2023). Has been treated with several courses of abx, prednisone, cough suppressants and albuterol.      Has h/o HFpEF (last echo 05/2020) which he said he was not aware of - LVEF 60-65%. Per chart review, he dr. Cristal Deer End (with [facility]  in [place]) and ha also seen pulm at that time.      Has OSA on CPAP which helps.     Inguinal hernia, left s/p repair:  States that he is starting to feel symptoms again especially worst with his constant cough.     Diabetes:  Diagnosed with borderline diabetes (A1C 6.6%) in 11/2019.  On metformin 500mg  daily.  Tolerating well without any side effects.     ED:  On sildenafil but states that he hate taking it only when needed. Would like something he can take daily.     Pertinent negatives include  no chest pain, no abdominal pain, fever or chills.     Social History:  -Non-smoker  -Truck driver      Other doctors:  -Cardiology   -Pulm - For chronic cough    Health Maintenance  reviewed -     Health Maintenance Due   Topic Date Due    Pneumococcal 0-64 years Vaccine (1 of 2 - PCV) Never done    HIV screen  Never done    Diabetic retinal exam  Never done    Hepatitis C screen  Never done    Hepatitis B vaccine (1 of 3 - 19+ 3-dose series) Never done    DTaP/Tdap/Td vaccine (1 - Tdap) 01/30/2018    Flu vaccine (1) 01/19/2023    Diabetic foot exam  02/05/2023    COVID-19 Vaccine (5 - 2023-24 season) 02/19/2023       Review of Systems   A comprehensive review of systems was negative except for that written in the History of Present Illness.     Allergies- reviewed:   Allergies   Allergen Reactions    Lisinopril Cough       Medications- reviewed:   Current Outpatient Medications   Medication Sig    prednisoLONE acetate (PRED FORTE) 1 % ophthalmic suspension Place 1 drop into the left eye 3 times daily    meloxicam (MOBIC) 15 MG tablet Take 1 tablet  by mouth daily as needed for Pain    methocarbamol (ROBAXIN) 750 MG tablet Take 1 tablet by mouth 3 times daily as needed (muscle spasms)    ofloxacin (FLOXIN) 0.3 % otic solution Place 5 drops into the left ear 2 times daily for 10 days    pantoprazole (PROTONIX) 40 MG tablet Take 1 tablet by mouth every morning    valsartan-hydroCHLOROthiazide (DIOVAN-HCT) 160-25 MG per tablet Take 1 tablet by mouth daily    ondansetron (ZOFRAN-ODT) 8 MG TBDP disintegrating tablet Take 1 tablet by mouth every 8 hours as needed for Nausea or Vomiting    metoprolol succinate (TOPROL XL) 50 MG extended release tablet Take 1 tablet by mouth daily After a meal    metFORMIN (GLUCOPHAGE) 500 MG tablet TAKE 1 TABLET BY MOUTH EVERY DAY    atorvastatin (LIPITOR) 20 MG tablet Take 1 tablet by mouth every evening    fluticasone-umeclidin-vilant (TRELEGY ELLIPTA) 100-62.5-25 MCG/ACT AEPB inhaler TAKE 1 PUFF BY MOUTH EVERY DAY    Multiple Vitamins-Minerals (MENS 50+ ADVANCED PO) Take by mouth    levalbuterol (XOPENEX HFA) 45 MCG/ACT inhaler Inhale 1 puff into the lungs every 6  hours as needed for Wheezing    bumetanide (BUMEX) 1 MG tablet Take 1 tablet by mouth daily    potassium chloride (KLOR-CON M) 20 MEQ extended release tablet Take 1 tablet by mouth daily    tamsulosin (FLOMAX) 0.4 MG capsule Take by mouth daily    tadalafil (CIALIS) 5 MG tablet START WITH 1/2 tablet daily. Increase to 1 tab daily after 2 weeks if tolerated    aspirin 81 MG EC tablet Take by mouth    fluticasone (FLONASE) 50 MCG/ACT nasal spray by Nasal route     No current facility-administered medications for this visit.       Past Medical History- reviewed:  Past Medical History:   Diagnosis Date    Asthma     Diabetes (HCC)     Erectile dysfunction     GERD (gastroesophageal reflux disease)     High cholesterol     Hypertension     Obesity     OSA (obstructive sleep apnea)        Past Surgical History- reviewed:   Past Surgical History:   Procedure Laterality Date    CHOLECYSTECTOMY      HERNIA REPAIR      UPPER GASTROINTESTINAL ENDOSCOPY         Family History - reviewed:  Family History   Problem Relation Age of Onset    Hypertension Mother     Cancer Father     No Known Problems Son     No Known Problems Son     No Known Problems Daughter     No Known Problems Daughter        Social History - reviewed:  Social History     Socioeconomic History    Marital status: Legally Separated     Spouse name: Not on file    Number of children: 4    Years of education: 12    Highest education level: 12th grade   Occupational History    Not on file   Tobacco Use    Smoking status: Former     Current packs/day: 0.00     Average packs/day: 0.5 packs/day for 15.0 years (7.5 ttl pk-yrs)     Types: Cigarettes     Start date: 10/03/1986     Quit date: 10/02/2001  Years since quitting: 21.5     Passive exposure: Past    Smokeless tobacco: Never   Vaping Use    Vaping status: Never Used   Substance and Sexual Activity    Alcohol use: Yes     Comment: every day    Drug use: Never    Sexual activity: Not Currently     Partners:  Female   Other Topics Concern    Not on file   Social History Narrative    Not on file     Social Determinants of Health     Financial Resource Strain: Low Risk  (11/08/2022)    Overall Financial Resource Strain (CARDIA)     Difficulty of Paying Living Expenses: Not hard at all   Food Insecurity: No Food Insecurity (11/08/2022)    Hunger Vital Sign     Worried About Running Out of Food in the Last Year: Never true     Ran Out of Food in the Last Year: Never true   Transportation Needs: Unknown (11/08/2022)    PRAPARE - Therapist, art (Medical): Not on file     Lack of Transportation (Non-Medical): No   Physical Activity: Insufficiently Active (10/29/2021)    Exercise Vital Sign     Days of Exercise per Week: 2 days     Minutes of Exercise per Session: 40 min   Stress: No Stress Concern Present (10/29/2021)    [employer] of Occupational Health - Occupational Stress Questionnaire     Feeling of Stress : Not at all   Social Connections: Socially Isolated (10/29/2021)    Social Connection and Isolation Panel [NHANES]     Frequency of Communication with Friends and Family: Three times a week     Frequency of Social Gatherings with Friends and Family: Three times a week     Attends Religious Services: Never     Active Member of Clubs or Organizations: No     Attends Banker Meetings: Never     Marital Status: Separated   Intimate Partner Violence: Not At Risk (10/29/2021)    Humiliation, Afraid, Rape, and Kick questionnaire     Fear of Current or Ex-Partner: No     Emotionally Abused: No     Physically Abused: No     Sexually Abused: No   Housing Stability: Unknown (11/08/2022)    Housing Stability Vital Sign     Unable to Pay for Housing in the Last Year: Not on file     Number of Places Lived in the Last Year: Not on file     Unstable Housing in the Last Year: No         Objective:   BP 122/79   Pulse 75   Temp 98 F (36.7 C) (Temporal)   Resp 17   Ht 1.803 m (5\' 11" )   Wt  116 kg (255 lb 11.7 oz)   SpO2 95%   BMI 35.67 kg/m     General appearance: alert, cooperative, no distress, appears stated age  Head: Normocephalic, without obvious abnormality, atraumatic, sinuses nontender to percussion  Eyes: conjunctivae/corneas clear. PERRL, EOM's intact.   Ears: Right - normal TM's and external ear canals AU. Left - Normal TM's, canal irritated with foreign body (cotton bulb inside)   Nose: Nares normal. Septum midline. Mucosa normal. No drainage or sinus tenderness.  Throat: Lips, mucosa, and tongue normal. Teeth and gums normal  Neck: supple, symmetrical, trachea midline, no  adenopathy, thyroid: not enlarged, symmetric, no tenderness/mass/nodules, no carotid bruit and no JVD  Back: symmetric, no curvature. ROM normal. No CVA tenderness.  Lungs: clear to auscultation bilaterally, no wheezes, no increased work of breathing  Heart: regular rate and rhythm, S1, S2 normal, no murmur, click, rub or gallop  Abdomen: soft, non-tender. Bowel sounds normal. No masses,  no organomegaly  Extremities: extremities normal, atraumatic, no cyanosis or edema  Pulses: 2+ and symmetric  Skin: Skin color, texture, turgor normal. No rashes or lesions  Lymph nodes: Cervical, supraclavicular, and axillary nodes normal.  MSK: FROM in all extremities without any pain  Neurologic: Alert and oriented X 3, normal strength and tone. Normal symmetric reflexes. Normal coordination and gait    Depression screening:  No data recorded    Assessment/Plan     1. Annual physical exam  Assessment & Plan:  I have discussed with pt the following topics:   - Discussed with patient the cancer risk factors and recommendations  - Reviewed diet, exercise and weight control  -Immunizations appropriate for age were discussed with patient and updated   - Recommended sodium restriction  - Reviewed medications and side effects in detail  Orders:  -     Total PSA with Free PSA Reflex; Future  2. Weak urinary stream  -     Total PSA with  Free PSA Reflex; Future  3. Chronic right shoulder pain  -     meloxicam (MOBIC) 15 MG tablet; Take 1 tablet by mouth daily as needed for Pain, Disp-30 tablet, R-0Normal  4. Back spasm  -     methocarbamol (ROBAXIN) 750 MG tablet; Take 1 tablet by mouth 3 times daily as needed (muscle spasms), Disp-30 tablet, R-0Normal  5. Other infective acute otitis externa of left ear  Assessment & Plan:  Was able to remove the cotton bulb in ear with flushing. Sending short course of ofloxacin to pharmacy  Orders:  -     ofloxacin (FLOXIN) 0.3 % otic solution; Place 5 drops into the left ear 2 times daily for 10 days, Disp-10 mL, R-0Normal      Follow up: 1 year. RTC to clinic sooner should symptoms persist, worsen or fail to improve as anticipated.    We discussed the expected course, resolution and complications of the diagnosis(es) in detail.  Medication risks, benefits, costs, interactions, and alternatives were discussed as indicated.  I advised to contact the office if his condition worsens, changes or fails to improve as anticipated. Pt expressed understanding with the diagnosis(es) and plan. Patient understands that this encounter was a temporary measure, and the importance of further follow up and examination was emphasized.  Patient verbalized understanding.      Signed By: Rene Paci, MD     April 10, 2023

## 2023-04-10 NOTE — Progress Notes (Signed)
Chief Complaint   Patient presents with    Annual Exam     Patient fasting for labs, [facility].    "Have you been to the ER, urgent care clinic since your last visit?  Hospitalized since your last visit?"    NO    "Have you seen or consulted any other health care providers outside of [facility] since your last visit?"      Yes Ophthalmologist 04/03/2023 Left Eye Pain    There were no vitals filed for this visit.   Health Maintenance Due   Topic Date Due    Pneumococcal 0-64 years Vaccine (1 of 2 - PCV) Never done    HIV screen  Never done    Diabetic retinal exam  Never done    Hepatitis C screen  Never done    Hepatitis B vaccine (1 of 3 - 19+ 3-dose series) Never done    DTaP/Tdap/Td vaccine (1 - Tdap) 01/30/2018    Flu vaccine (1) 01/19/2023    Diabetic foot exam  02/05/2023    COVID-19 Vaccine (5 - 2023-24 season) 02/19/2023        The patient, Jason Greene, identity was verified by name and DOB.

## 2023-04-10 NOTE — Assessment & Plan Note (Signed)
I have discussed with pt the following topics:   - Discussed with patient the cancer risk factors and recommendations  - Reviewed diet, exercise and weight control  -Immunizations appropriate for age were discussed with patient and updated   - Recommended sodium restriction  - Reviewed medications and side effects in detail

## 2023-04-12 LAB — TOTAL PSA WITH FREE PSA REFLEX: PSA: 1.3 ng/mL (ref 0.0–4.0)

## 2023-04-28 ENCOUNTER — Encounter

## 2023-04-28 MED ORDER — HYDROCOD POLI-CHLORPHE POLI ER 10-8 MG/5ML PO SUER
10-8 | Freq: Two times a day (BID) | ORAL | 0 refills | Status: AC | PRN
Start: 2023-04-28 — End: 2023-05-08

## 2023-05-09 ENCOUNTER — Encounter: Admit: 2023-05-09 | Admitting: Family Medicine

## 2023-05-09 DIAGNOSIS — G8929 Other chronic pain: Secondary | ICD-10-CM

## 2023-05-09 DIAGNOSIS — M25511 Pain in right shoulder: Secondary | ICD-10-CM

## 2023-05-10 NOTE — Telephone Encounter (Signed)
 Last appointment: 04/10/23  Next appointment: Advised to follow-up 03/2024  Previous refill encounter(s): 04/10/23 #30    Requested Prescriptions     Pending Prescriptions Disp Refills    meloxicam (MOBIC) 15 MG tablet [Pharmacy Med Name: MELOXICAM 15 MG T

## 2023-06-12 ENCOUNTER — Encounter: Admit: 2023-06-12 | Admitting: Family Medicine

## 2023-06-12 DIAGNOSIS — M25511 Pain in right shoulder: Secondary | ICD-10-CM

## 2023-06-12 DIAGNOSIS — G8929 Other chronic pain: Secondary | ICD-10-CM

## 2023-06-13 MED ORDER — MELOXICAM 15 MG PO TABS
15 MG | ORAL_TABLET | Freq: Every day | ORAL | 0 refills | Status: DC | PRN
Start: 2023-06-13 — End: 2023-11-23

## 2023-06-13 NOTE — Telephone Encounter (Signed)
 Last appointment: 04/10/23  Next appointment: Advised to follow-up 03/2024  Previous refill encounter(s): 04/10/23 #30    Requested Prescriptions     Pending Prescriptions Disp Refills    meloxicam  (MOBIC ) 15 MG tablet [Pharmacy Med Name: MELOXICAM  15 MG TABLET] 30 tablet 5     Sig: TAKE 1 TABLET BY MOUTH EVERY DAY AS NEEDED FOR PAIN         For Pharmacy Admin Tracking Only    Program: Medication Refill  CPA in place:    Recommendation Provided To:   Intervention Detail: New Rx: 1, reason: Patient Preference  Intervention Accepted By:   Alethia Huxley Closed?:    Time Spent (min): 5

## 2023-07-07 MED ORDER — METOPROLOL SUCCINATE ER 50 MG PO TB24
50 | ORAL_TABLET | Freq: Every day | ORAL | 3 refills | 30.00000 days | Status: DC
Start: 2023-07-07 — End: 2024-01-11

## 2023-07-07 MED ORDER — VALSARTAN-HYDROCHLOROTHIAZIDE 160-25 MG PO TABS
160-25 | ORAL_TABLET | Freq: Every day | ORAL | 3 refills | Status: DC
Start: 2023-07-07 — End: 2024-01-11

## 2023-07-07 MED ORDER — ATORVASTATIN CALCIUM 20 MG PO TABS
20 | ORAL_TABLET | Freq: Every evening | ORAL | 3 refills | 30.00000 days | Status: DC
Start: 2023-07-07 — End: 2024-01-11

## 2023-07-07 MED ORDER — METFORMIN HCL 500 MG PO TABS
500 | ORAL_TABLET | Freq: Every day | ORAL | 3 refills | Status: DC
Start: 2023-07-07 — End: 2024-01-11

## 2023-07-07 NOTE — Telephone Encounter (Signed)
[facility] is requesting a 90 day supply  Previous Rx's were sent to [facility]    Last appointment: 04/10/23  Next appointment: Advised to follow-up 03/2024    Requested Prescriptions     Pending Prescriptions Disp Refills    atorvastatin (LIPITOR) 20 MG tablet 90 tablet 3     Sig: Take 1 tablet by mouth every evening    metFORMIN (GLUCOPHAGE) 500 MG tablet 90 tablet 3     Sig: Take 1 tablet by mouth daily    metoprolol succinate (TOPROL XL) 50 MG extended release tablet 90 tablet 3     Sig: Take 1 tablet by mouth daily After a meal    valsartan-hydroCHLOROthiazide (DIOVAN-HCT) 160-25 MG per tablet 90 tablet 3     Sig: Take 1 tablet by mouth daily         For Pharmacy Admin Tracking Only    Program: Medication Refill  CPA in place:    Recommendation Provided To:   Intervention Detail: New Rx: 4, reason: Improve Adherence  Intervention Accepted By:   Eugenia Pancoast Closed?:    Time Spent (min): 5

## 2023-07-18 ENCOUNTER — Ambulatory Visit
Admit: 2023-07-18 | Discharge: 2023-07-18 | Payer: BLUE CROSS/BLUE SHIELD | Attending: Family Medicine | Primary: Family Medicine

## 2023-07-18 VITALS — Temp 98.60000°F | Resp 17 | Ht 69.0 in | Wt 260.1 lb

## 2023-07-18 DIAGNOSIS — R300 Dysuria: Secondary | ICD-10-CM

## 2023-07-18 LAB — AMB POC URINALYSIS DIP STICK AUTO W/O MICRO
Bilirubin, Urine, POC: NEGATIVE
Glucose, Urine, POC: NEGATIVE
Ketones, Urine, POC: NEGATIVE
Nitrite, Urine, POC: NEGATIVE
Protein, Urine, POC: NEGATIVE
Specific Gravity, Urine, POC: 1.03 (ref 1.001–1.035)
Urobilinogen, POC: 0.2
pH, Urine, POC: 5.5 (ref 4.6–8.0)

## 2023-07-18 MED ORDER — SULFAMETHOXAZOLE-TRIMETHOPRIM 800-160 MG PO TABS
800-160 | ORAL_TABLET | Freq: Two times a day (BID) | ORAL | 0 refills | Status: AC
Start: 2023-07-18 — End: 2023-07-25

## 2023-07-18 MED ORDER — TAMSULOSIN HCL 0.4 MG PO CAPS
0.4 | ORAL_CAPSULE | Freq: Every day | ORAL | 0 refills | Status: DC
Start: 2023-07-18 — End: 2024-01-11

## 2023-07-18 NOTE — Progress Notes (Signed)
[place] [employer] [place] [facility]  [facility]  [phone]. [address]  [place], [place] [address]  [phone]    C/C: Acute/chronic medical problems    Subjective  Jason Greene is a 53 y.o. [place] / [place] American male , established patient, here for evaluation of the concern(s) below;      PMHx: DM II, ED, HFpEF (last Echo 05/2020- not following with cards), inguinal hernia, OSA on CPAP  and chronic cough.    Acute concern:    Dysuria:  Pt complains of  urgency and dysuria that started about 2 weeks. Urine also has some odor per pt. No blood in the urine. Pt denies any flank pain, fever, chills, nausea, or vomiting. Has not passed any stones      Other chronic problems (not addressed today)  Chronic cough/Asthma and CHF:  Now follows with ENT/GI, scheduled for endoscopy.  Has h/o HFpEF (last echo 05/2020) which he said he was not aware of - LVEF 60-65%. Per chart review, he dr. Cristal Deer End (with [facility]  in [place]) and ha also seen pulm at that time.      Inguinal hernia, left s/p repair:  States that he is starting to feel symptoms again especially worst with his constant cough.     Diabetes:  Diagnosed with borderline diabetes (A1C 6.6%) in 11/2019.  On metformin 500mg  daily.  Tolerating well without any side effects.     ED:  On sildenafil but states that he hate taking it only when needed. Would like something he can take daily.     Pertinent negatives include  no chest pain, no abdominal pain, fever or chills.     Social History:  -Non-smoker  -Truck driver      Other doctors:  -Cardiology   -Pulm - For chronic cough  Allergies - reviewed:   Allergies   Allergen Reactions    Lisinopril Cough       Past Medical History - reviewed:  Past Medical History:   Diagnosis Date    Asthma     Diabetes (HCC)     Erectile dysfunction     GERD (gastroesophageal reflux disease)     High cholesterol     Hypertension     Obesity     OSA (obstructive sleep apnea)        Depression screening:  PHQ-9 Total Score:  0 (07/18/2023  3:07 PM)      Review of systems:   A comprehensive review of systems was negative except for that written in the History of Present Illness.     Physical Exam  Temp 98.6 F (37 C) (Temporal)   Resp 17   Ht 1.753 m (5\' 9" )   Wt 118 kg (260 lb 2.3 oz)   BMI 38.42 kg/m     General: Alert and oriented, in no acute distress.  SKIN: No rash. No suspicious lesions or moles.  EYE: PERRL. Sclera and conjuctival clear. Extraocular movements intact.  EARS: External normal, canals clear, tympanic membranes normal.   NOSE: Mucosa healthy without drainage or ulceration.  OROPHARYNX: No suspicious lesions, normal dentition, pharynx, tongue and tonsils normal.  NECK: Supple; no masses; thyroid normal.  LYMPH NODES: No significant cervial lymphadenopathy.  LUNGS: Respirations unlabored; clear to auscultation bilaterally.  CARDIOVASCULAR: Regular, rate, and rhythm without murmurs, gallops or rubs.  ABDOMEN: Soft; nontender; nondistended; normoactive bowel sounds; no masses or organomegaly.  MUSCULOSKELETAL: FROM in all extremities     EXT: No edema. Neurovascularlly  intact. Normal gait.  Neurological: Alert and oriented X 3, normal strength and tone. Normal symmetric reflexes. Normal coordination and gait     Assessment/Plan  1. Dysuria  Assessment & Plan:  U/A - 1+ Blood and trace LE.  Discussed getting Xray to rule out stone but pt would like to defer for now.  Will be getting urine culture.  Starting empirically on bactrim and flomax.  RTC if persistent or go to ER for worsening symptoms.  Orders:  -     sulfamethoxazole-trimethoprim (BACTRIM DS;SEPTRA DS) 800-160 MG per tablet; Take 1 tablet by mouth 2 times daily for 7 days, Disp-14 tablet, R-0Normal  -     tamsulosin (FLOMAX) 0.4 MG capsule; Take 1 capsule by mouth daily, Disp-30 capsule, R-0Normal  -     Culture, Urine; Future  2. Urinary frequency  -     AMB POC URINALYSIS DIP STICK AUTO W/O MICRO  -     Culture, Urine; Future       Follow up: RTC to  clinic sooner should symptoms persist, worsen or fail to improve as anticipated.    On this date 07/18/23 I have spent 30 minutes reviewing previous notes, test results and face to face with the patient discussing the diagnosis and importance of compliance with the treatment plan as well as documenting on the day of the visit.       We discussed the expected course, resolution and complications of the diagnosis(es) in detail.  Medication risks, benefits, costs, interactions, and alternatives were discussed as indicated.  I advised to contact the office if his condition worsens, changes or fails to improve as anticipated. Pt expressed understanding with the diagnosis(es) and plan. Patient understands that this encounter was a temporary measure, and the importance of further follow up and examination was emphasized.  Patient verbalized understanding.      Signed By: Rene Paci, MD     July 18, 2023

## 2023-07-18 NOTE — Assessment & Plan Note (Signed)
U/A - 1+ Blood and trace LE.  Discussed getting Xray to rule out stone but pt would like to defer for now.  Will be getting urine culture.  Starting empirically on bactrim and flomax.  RTC if persistent or go to ER for worsening symptoms.

## 2023-07-18 NOTE — Progress Notes (Signed)
Chief Complaint   Patient presents with    Urinary Frequency     X 2 weeks    Urine Odor     "Have you been to the ER, urgent care clinic since your last visit?  Hospitalized since your last visit?"    NO    "Have you seen or consulted any other health care providers outside of [facility] since your last visit?"      Yes   Dr. Sandie Ano  Consult Endoscopy  06/2023       Vitals:    07/18/23 1501   Resp: 17   Temp: 98.6 F (37 C)   TempSrc: Temporal   Weight: 118 kg (260 lb 2.3 oz)   Height: 1.753 m (5\' 9" )      Health Maintenance Due   Topic Date Due    Pneumococcal 0-64 years Vaccine (1 of 2 - PCV) Never done    HIV screen  Never done    Diabetic retinal exam  Never done    Hepatitis C screen  Never done    Hepatitis B vaccine (1 of 3 - 19+ 3-dose series) Never done    DTaP/Tdap/Td vaccine (1 - Tdap) 01/30/2018    Diabetic foot exam  02/05/2023    A1C test (Diabetic or Prediabetic)  07/20/2023    Diabetic Alb to Cr ratio (uACR) test  07/20/2023    Lipids  07/20/2023    GFR test (Diabetes, CKD 3-4, OR last GFR 15-59)  07/20/2023      The patient, Jason Greene, identity was verified by name and DOB, pharmacy verified  Labs:Yes  Fasting:No

## 2023-07-19 ENCOUNTER — Encounter

## 2023-07-20 LAB — CULTURE, URINE: Culture: NO GROWTH

## 2023-07-25 ENCOUNTER — Encounter

## 2023-07-25 MED ORDER — HYDROCOD POLI-CHLORPHE POLI ER 10-8 MG/5ML PO SUER
10-85 | Freq: Two times a day (BID) | ORAL | 0 refills | Status: AC | PRN
Start: 2023-07-25 — End: 2023-08-04

## 2023-07-26 ENCOUNTER — Inpatient Hospital Stay: Admit: 2023-07-26 | Payer: BLUE CROSS/BLUE SHIELD | Attending: Family Medicine | Primary: Family Medicine

## 2023-07-26 DIAGNOSIS — R319 Hematuria, unspecified: Secondary | ICD-10-CM

## 2023-08-31 ENCOUNTER — Ambulatory Visit: Admit: 2023-08-31 | Discharge: 2023-08-31 | Payer: BLUE CROSS/BLUE SHIELD | Primary: Family Medicine

## 2023-08-31 VITALS — BP 105/61 | HR 71 | Temp 98.30000°F | Resp 18 | Wt 278.0 lb

## 2023-08-31 DIAGNOSIS — J45901 Unspecified asthma with (acute) exacerbation: Secondary | ICD-10-CM

## 2023-08-31 MED ORDER — IPRATROPIUM-ALBUTEROL 0.5-2.5 (3) MG/3ML IN SOLN
0.5-2.5 | Freq: Once | RESPIRATORY_TRACT | Status: AC
Start: 2023-08-31 — End: 2023-08-31
  Administered 2023-08-31: 21:00:00 1 via RESPIRATORY_TRACT

## 2023-08-31 MED ORDER — PREDNISONE 20 MG PO TABS
20 | ORAL_TABLET | Freq: Every day | ORAL | 0 refills | Status: AC
Start: 2023-08-31 — End: 2023-09-06

## 2023-08-31 MED ORDER — ALBUTEROL SULFATE HFA 108 (90 BASE) MCG/ACT IN AERS
108 | Freq: Four times a day (QID) | RESPIRATORY_TRACT | 0 refills | Status: DC | PRN
Start: 2023-08-31 — End: 2023-08-31

## 2023-08-31 MED ORDER — DEXAMETHASONE SODIUM PHOSPHATE 10 MG/ML IJ SOLN
10 | Freq: Once | INTRAMUSCULAR | Status: AC
Start: 2023-08-31 — End: 2023-08-31
  Administered 2023-08-31: 21:00:00 10 mg via INTRAMUSCULAR

## 2023-08-31 NOTE — Patient Instructions (Signed)
 Thank you for visiting [facility] today.    We evaluated you for reactive airway disease exacerbation (worsening).  Asthma is caused by inflammation and tightening of muscles in the airway. You were given breathing treatments and steroids, and your breathing improved. These medications will help with your current attack. A visit to the UC is a warning sign that your asthma may not be managed adequately. Asthma is a chronic condition that cannot be cured, but medications can help control your symptoms.  Steps to take at home:  Use your albuterol (inhaler or nebulizer) as directed for your symptoms. If you have a spacer, we recommend using it to help deliver the medication to your lungs more effectively.  Take the steroids (dexamethasone or prednisone) as directed to help reduce lung inflammation and decrease the risk of another attack in the next few days.  Take your other home medications as directed.  Follow up with your primary care doctor or pulmonologist (lung doctor) within one week to discuss your symptoms. They can talk with you about daily medications to help prevent asthma attacks. Ask them about writing an "asthma action plan"  to prepare for future asthma exacerbations.    Please speak to your doctor or go to the ER for new symptoms, such as difficulty breathing that doesn't improve with your medications, chest pain, voice changes, high fevers, confusion, or other new or worsening symptoms.

## 2023-08-31 NOTE — Progress Notes (Signed)
 08/31/2023   Jason Greene (DOB: 1971-04-13) is a 53 y.o. male, New patient, here for evaluation of the following chief complaint(s):  Cold Symptoms (Cough, headache, x 1+ year. Has seen PCP for issue, cough and getting worse, throat pain and hoarse voice. )     ASSESSMENT/PLAN:  Below is the assessment and plan developed based on review of pertinent history, physical exam, labs, studies, and medications.  Assessment & Plan  Reactive airway disease with acute exacerbation, unspecified asthma severity, unspecified whether persistent       Orders:    ipratropium 0.5 mg-albuterol 2.5 mg (DUONEB) nebulizer solution 1 Dose    dexAMETHasone (DECADRON) injection 10 mg    predniSONE (DELTASONE) 20 MG tablet; Take 2 tablets by mouth daily for 5 days  We evaluated you for reactive airway disease exacerbation (worsening).  Asthma is caused by inflammation and tightening of muscles in the airway. You were given breathing treatments and steroids, and your breathing improved. These medications will help with your current attack. A visit to the UC is a warning sign that your asthma may not be managed adequately. Asthma is a chronic condition that cannot be cured, but medications can help control your symptoms.  Steps to take at home:  Use your albuterol (inhaler or nebulizer) as directed for your symptoms. If you have a spacer, we recommend using it to help deliver the medication to your lungs more effectively.  Take the steroids (dexamethasone or prednisone) as directed to help reduce lung inflammation and decrease the risk of another attack in the next few days.  Take your other home medications as directed.  Follow up with your primary care doctor or pulmonologist (lung doctor) within one week to discuss your symptoms. They can talk with you about daily medications to help prevent asthma attacks. Ask them about writing an "asthma action plan"  to prepare for future asthma exacerbations.    Please speak to your doctor or go to  the ER for new symptoms, such as difficulty breathing that doesn't improve with your medications, chest pain, voice changes, high fevers, confusion, or other new or worsening symptoms.       Handout given with care instructions  2. OTC for symptom management. Increase fluid intake, ensure adequate nutritional intake.  3. Follow up with PCP as needed.  4. Go to ED with development of any acute symptoms.     Follow up:  No follow-ups on file.  Follow up immediately for any new, worsening or changes or if symptoms are not improving over the next 5-7 days.     SUBJECTIVE/OBJECTIVE:  HPI   Jason Greene presents with concern for worsening cough that is waking him up out of his sleep at night. He states he takes trelegy and has an albuterol inhaler but he is unsure of what is causing his cough. He states that he is a [employer] and sometimes it interferes with his job. Otherwise, he denies any fever, chills, chest pain, nausea, vomiting, or other systemic complaints or concerns at this time.     Cold Symptoms (Cough, headache, x 1+ year. Has seen PCP for issue, cough and getting worse, throat pain and hoarse voice. )      No results found for any visits on 08/31/23.    No results found for this visit on 08/31/23.    Review of Systems    ROS reviewed and negative except as stated in the HPI   Physical Exam  Vitals reviewed.  Constitutional:       General: He is not in acute distress.     Appearance: Normal appearance. He is not ill-appearing or toxic-appearing.   HENT:      Head: Normocephalic and atraumatic.   Eyes:      Extraocular Movements: Extraocular movements intact.      Pupils: Pupils are equal, round, and reactive to light.   Cardiovascular:      Rate and Rhythm: Normal rate and regular rhythm.   Pulmonary:      Effort: Pulmonary effort is normal. No respiratory distress.      Breath sounds: No stridor. Wheezing and rhonchi present. No rales.      Comments: There is diffuse expiratory wheezing in all lung  fields with rhonchi prior to breathing treatment.  Wheezing and rhonchi in overall aeration majorly improved after steroid and breathing treatment.  Chest:      Chest wall: No tenderness.   Abdominal:      General: There is no distension.      Palpations: There is no mass.      Tenderness: There is no abdominal tenderness.   Skin:     General: Skin is warm.      Comments: No rash appreciated on exposed skin   Neurological:      General: No focal deficit present.      Mental Status: He is alert and oriented to person, place, and time.        Vitals:    08/31/23 1450   BP: 105/61   BP Site: Left Upper Arm   Patient Position: Sitting   BP Cuff Size: Medium Adult   Pulse: 71   Resp: 18   Temp: 98.3 F (36.8 C)   TempSrc: Temporal   SpO2: 96%   Weight: 126.1 kg (278 lb)        Allergies   Allergen Reactions    Lisinopril Cough       Prior to Admission medications    Medication Sig Start Date End Date Taking? Authorizing Provider   Vonoprazan Fumarate (VOQUEZNA) 20 MG TABS Take 20 mg by mouth daily    [provider]   tamsulosin (FLOMAX) 0.4 MG capsule Take 1 capsule by mouth daily 07/18/23   Jonnie Kind, MD   atorvastatin (LIPITOR) 20 MG tablet Take 1 tablet by mouth every evening 07/07/23   Jonnie Kind, MD   metFORMIN (GLUCOPHAGE) 500 MG tablet Take 1 tablet by mouth daily 07/07/23   Jonnie Kind, MD   metoprolol succinate (TOPROL XL) 50 MG extended release tablet Take 1 tablet by mouth daily After a meal 07/07/23   Jonnie Kind, MD   valsartan-hydroCHLOROthiazide (DIOVAN-HCT) 160-25 MG per tablet Take 1 tablet by mouth daily 07/07/23   Jonnie Kind, MD   meloxicam (MOBIC) 15 MG tablet TAKE 1 TABLET BY MOUTH EVERY DAY AS NEEDED FOR PAIN 06/13/23   Jonnie Kind, MD   prednisoLONE acetate (PRED FORTE) 1 % ophthalmic suspension Place 1 drop into the left eye 3 times daily  Patient not taking: Reported on 07/18/2023 04/03/23   [provider]   ondansetron (ZOFRAN-ODT) 8 MG TBDP  disintegrating tablet Take 1 tablet by mouth every 8 hours as needed for Nausea or Vomiting 03/14/23   Jonnie Kind, MD   fluticasone-umeclidin-vilant (TRELEGY ELLIPTA) 100-62.5-25 MCG/ACT AEPB inhaler TAKE 1 PUFF BY MOUTH EVERY DAY 01/17/23   Jonnie Kind, MD   Multiple Vitamins-Minerals (MENS 50+ ADVANCED  PO) Take by mouth    [provider]   levalbuterol Pauline Aus HFA) 45 MCG/ACT inhaler Inhale 1 puff into the lungs every 6 hours as needed for Wheezing 09/13/22 09/13/23  Jonnie Kind, MD   bumetanide (BUMEX) 1 MG tablet Take 1 tablet by mouth daily 07/19/22   Jonnie Kind, MD   potassium chloride (KLOR-CON M) 20 MEQ extended release tablet Take 1 tablet by mouth daily 07/19/22   Jonnie Kind, MD   tadalafil (CIALIS) 5 MG tablet START WITH 1/2 tablet daily. Increase to 1 tab daily after 2 weeks if tolerated 02/04/22   Jonnie Kind, MD   aspirin 81 MG EC tablet Take by mouth 10/24/18   [provider]   fluticasone (FLONASE) 50 MCG/ACT nasal spray by Nasal route    [provider]        Past Medical History:   Diagnosis Date    Asthma     Diabetes (HCC)     Erectile dysfunction     GERD (gastroesophageal reflux disease)     High cholesterol     Hypertension     Obesity     OSA (obstructive sleep apnea)         Past Surgical History:   Procedure Laterality Date    CHOLECYSTECTOMY      HERNIA REPAIR      UPPER GASTROINTESTINAL ENDOSCOPY          Social History:   Social Connections: Socially Isolated (10/29/2021)    Social Connection and Isolation Panel [NHANES]     Frequency of Communication with Friends and Family: Three times a week     Frequency of Social Gatherings with Friends and Family: Three times a week     Attends Religious Services: Never     Active Member of Clubs or Organizations: No     Attends Banker Meetings: Never     Marital Status: Separated        Patient Care Team:  Jonnie Kind, MD as PCP - General (Family Medicine)  Jonnie Kind, MD as PCP - Empaneled Provider    Patient Active Problem List   Diagnosis    Allergic rhinitis    Asthma    Chronic heart failure with preserved ejection fraction (HFpEF) (HCC)    Diabetes mellitus (HCC)    Erectile dysfunction    Gastroesophageal reflux disease without esophagitis    Essential hypertension    Hyperlipidemia    Obstructive sleep apnea syndrome    Morbid obesity    Palpitations    Vaccine counseling    DOE (dyspnea on exertion)    Nonrheumatic mitral valve regurgitation    Acute upper respiratory infection    Left ear pain    Otitis externa    Dysuria            I ADVISED PATIENT TO GO TO ER IF SYMPTOMS WORSEN , CHANGE OR FAILS TO IMPROVE.    I have discussed the diagnosis with the patient and the intended plan as seen in the above orders.  The patient has received an after-visit summary and questions were answered concerning future plans.  I have discussed medication side effects and warnings with the patient as well. The patient agrees and understands above plan.       An electronic signature was used to authenticate this note.  -- Margo Aye, MD

## 2023-09-09 ENCOUNTER — Encounter

## 2023-09-11 MED ORDER — TRELEGY ELLIPTA 100-62.5-25 MCG/ACT IN AEPB
100-62.5-25 | Freq: Every day | RESPIRATORY_TRACT | 0 refills | Status: DC
Start: 2023-09-11 — End: 2023-11-10

## 2023-09-11 NOTE — Telephone Encounter (Signed)
 Last appointment: 07/18/23  Next appointment: none  Previous refill encounter(s): 01/17/23 #1 with 5 refills    Requested Prescriptions     Pending Prescriptions Disp Refills    fluticasone-umeclidin-vilant (TRELEGY ELLIPTA) 100-62.5-25 MCG/ACT AEPB inhaler [Pharmacy Med Name: TRELEGY ELLIPTA 100-62.5-25] 60 each 0     Sig: INHALE 1 PUFF BY MOUTH EVERY DAY         For Pharmacy Admin Tracking Only    Program: Medication Refill  CPA in place:    Recommendation Provided To:   Intervention Detail: New Rx: 1, reason: Patient Preference  Intervention Accepted By:   Eugenia Pancoast Closed?:    Time Spent (min): 5

## 2023-09-30 ENCOUNTER — Ambulatory Visit: Admit: 2023-09-30 | Discharge: 2023-09-30 | Payer: BLUE CROSS/BLUE SHIELD | Primary: Family Medicine

## 2023-09-30 ENCOUNTER — Ambulatory Visit: Admit: 2023-09-30 | Payer: BLUE CROSS/BLUE SHIELD | Primary: Family Medicine

## 2023-09-30 VITALS — BP 125/78 | HR 84 | Temp 98.30000°F | Resp 16 | Wt 266.6 lb

## 2023-09-30 DIAGNOSIS — J4541 Moderate persistent asthma with (acute) exacerbation: Secondary | ICD-10-CM

## 2023-09-30 DIAGNOSIS — R051 Acute cough: Secondary | ICD-10-CM

## 2023-09-30 MED ORDER — IPRATROPIUM-ALBUTEROL 0.5-2.5 (3) MG/3ML IN SOLN
0.5-2.5 | Freq: Once | RESPIRATORY_TRACT | Status: AC
Start: 2023-09-30 — End: 2023-09-30
  Administered 2023-09-30: 20:00:00 1 via RESPIRATORY_TRACT

## 2023-09-30 MED ORDER — DEXAMETHASONE SODIUM PHOSPHATE 10 MG/ML IJ SOLN
10 | Freq: Once | INTRAMUSCULAR | Status: AC
Start: 2023-09-30 — End: 2023-09-30
  Administered 2023-09-30: 20:00:00 10 mg via INTRAMUSCULAR

## 2023-09-30 MED ORDER — PREDNISONE 20 MG PO TABS
20 | ORAL_TABLET | Freq: Every day | ORAL | 0 refills | Status: AC
Start: 2023-09-30 — End: 2023-10-05

## 2023-09-30 NOTE — Progress Notes (Signed)
 Jason Greene (DOB:  [birth year]) is a 53 y.o. male,Established patient, here for evaluation of the following chief complaint(s):  Cough (Coughing for a month; seen here before; still congested)      Assessment & Plan :  Visit Diagnoses and Associated Orders         Moderate persistent asthma with acute exacerbation    -  Primary    dexAMETHasone (DECADRON) injection 10 mg [2331]      predniSONE (DELTASONE) 20 MG tablet [6496]             Acute cough        XR CHEST PA/LAT [16109 CPT(R)]   - Future Order           Wheezing        ipratropium 0.5 mg-albuterol 2.5 mg (DUONEB) nebulizer solution 1 Dose [93931]                   Patient was seen today for evaluation of cough, chest tightness and wheezing which is ongoing for the past 4 days  Patient responded well to DuoNeb treatment in clinic  Will treat with Decadron injection in clinic, prednisone, 40 mg daily for the next 5 days  Continue home albuterol inhaler and Trelegy inhaler as previously prescribed  Chest x-ray shows no acute abnormality  Please monitor your symptoms carefully, go to the ER for any severe shortness of breath, chest pain or acute worsening of symptoms  Please follow-up with your PCP later this month for further evaluation       Subjective :    Cough         53 y.o. male presents with symptoms of cough, wheezing and chest tightness which is ongoing over the past 4 days.  He does have a history of asthma and states that this feels very much like prior asthma exacerbations.  He was seen here about 1 month ago for the same thing, was treated with an in office DuoNeb and Decadron injection.  He reported good improvement with this.  He did feel fully improved before worsening again about 4 days ago.  He denies fevers, chills, body aches or fatigue.  He does report some mild nasal congestion and postnasal drip but denies any significant ear or throat pain.  Reports coughing is occasionally wet sounding, but rarely productive.  He does use a daily  maintenance inhaler as well as albuterol inhaler as needed.  He had some blood work done with his primary care provider but has not been able to follow-up until the end of this month         Vitals:    09/30/23 1452   BP: 125/78   BP Site: Left Upper Arm   Patient Position: Sitting   BP Cuff Size: Large Adult   Pulse: 84   Resp: 16   Temp: 98.3 F (36.8 C)   TempSrc: Oral   SpO2: 92%   Weight: 120.9 kg (266 lb 9.6 oz)       Xray Result (most recent):  XR CHEST (2 VW) 09/30/2023    Narrative  Examination  Description: Chest xray, frontal and lateral views    Comparisons:  None provided.    Findings  The cardiomediastinal silhouette is unremarkable.    The lungs are clear. No consolidation is present.    No pleural effusion is noted.    There is no pneumothorax present.    The soft tissues and bones are unremarkable for age.  Impression  Negative examination for age. No consolidation.    Electronically signed by: P.A. Jerral Bonito, M.D. on 09/30/2023 14:78:29 PM  US/Eastern         Objective   Physical Exam  Vitals and nursing note reviewed.   Constitutional:       General: He is not in acute distress.     Appearance: Normal appearance. He is ill-appearing.   HENT:      Head: Normocephalic and atraumatic.      Right Ear: Tympanic membrane, ear canal and external ear normal. There is no impacted cerumen.      Left Ear: Tympanic membrane, ear canal and external ear normal. There is no impacted cerumen.      Nose: Nose normal. No congestion or rhinorrhea.      Mouth/Throat:      Mouth: Mucous membranes are moist.      Pharynx: Oropharynx is clear. No oropharyngeal exudate or posterior oropharyngeal erythema.   Cardiovascular:      Rate and Rhythm: Normal rate and regular rhythm.      Pulses: Normal pulses.      Heart sounds: Normal heart sounds. No murmur heard.     No friction rub. No gallop.   Pulmonary:      Effort: Pulmonary effort is normal. No respiratory distress.      Breath sounds: Wheezing present. No rhonchi  or rales.   Skin:     General: Skin is warm and dry.   Neurological:      General: No focal deficit present.      Mental Status: He is alert and oriented to person, place, and time.               An electronic signature was used to authenticate this note.    Judie Bonus, APRN - NP

## 2023-09-30 NOTE — Patient Instructions (Addendum)
 Patient was seen today for evaluation of cough, chest tightness and wheezing which is ongoing for the past 4 days  Patient responded well to DuoNeb treatment in clinic  Will treat with Decadron injection in clinic, prednisone, 40 mg daily for the next 5 days  Continue home albuterol inhaler and Trelegy inhaler as previously prescribed  I do not see anything obvious on x-ray, I am awaiting the radiologist final read and I will contact him with results if abnormal  Please monitor your symptoms carefully, go to the ER for any severe shortness of breath, chest pain or acute worsening of symptoms  Please follow-up with your PCP later this month for further evaluation

## 2023-11-06 NOTE — Telephone Encounter (Signed)
 Patient called and would like to move forward with the CT scan. He stated there were some other medical issues he had going on preventing him from getting this done last year. I advised that I would send message to nurse to have a new order put in.

## 2023-11-07 ENCOUNTER — Encounter

## 2023-11-07 NOTE — Telephone Encounter (Signed)
 Informed patient new CT order placed, he will call scheduling to obtain date, time and instructions. Dr. Gwenda Lema will call with results. Express understanding.

## 2023-11-10 ENCOUNTER — Encounter

## 2023-11-10 MED ORDER — TRELEGY ELLIPTA 100-62.5-25 MCG/ACT IN AEPB
100-62.5-25 | Freq: Every day | RESPIRATORY_TRACT | 1 refills | 30.00000 days | Status: DC
Start: 2023-11-10 — End: 2023-11-23

## 2023-11-10 NOTE — Telephone Encounter (Signed)
 Last appointment: 07/18/23  Next appointment: none  Previous refill encounter(s): 09/11/23 #60    Requested Prescriptions     Pending Prescriptions Disp Refills    TRELEGY ELLIPTA  100-62.5-25 MCG/ACT AEPB inhaler [Pharmacy Med Name: TRELEGY ELLIPTA  100-62.5-25] 60 each 1     Sig: INHALE 1 PUFF BY MOUTH EVERY DAY         For Pharmacy Admin Tracking Only    Program: Medication Refill  CPA in place:    Recommendation Provided To:   Intervention Detail: New Rx: 1, reason: Patient Preference  Intervention Accepted By:   Alethia Huxley Closed?:    Time Spent (min): 5

## 2023-11-15 ENCOUNTER — Inpatient Hospital Stay: Admit: 2023-11-15 | Payer: BLUE CROSS/BLUE SHIELD | Attending: Surgery | Primary: Family Medicine

## 2023-11-15 DIAGNOSIS — K4091 Unilateral inguinal hernia, without obstruction or gangrene, recurrent: Secondary | ICD-10-CM

## 2023-11-15 LAB — POCT CREATININE AND GFR
POC Creatinine: 0.7 mg/dL (ref 0.6–1.3)
eGFR, POC: >90 mL/min/{1.73_m2} (ref >60)

## 2023-11-15 MED ORDER — BARIUM SULFATE 2 % PO SUSP
2 | Freq: Once | ORAL | Status: AC | PRN
Start: 2023-11-15 — End: 2023-11-15
  Administered 2023-11-15: 18:00:00 900 mL via ORAL

## 2023-11-15 MED ORDER — IOPAMIDOL 76 % IV SOLN
76 | Freq: Once | INTRAVENOUS | Status: AC | PRN
Start: 2023-11-15 — End: 2023-11-15
  Administered 2023-11-15: 18:00:00 100 mL via INTRAVENOUS

## 2023-11-15 MED FILL — READI-CAT 2 2 % PO SUSP: 2 % | ORAL | Qty: 900 | Fill #0

## 2023-11-16 NOTE — Telephone Encounter (Signed)
 Patient last seen a year ago.  Will need to be seen back in the office.  Can go over the CT scan when I see him back in the office.    Recommend he work on getting pulmonary clearance as well as previously discussed.    Thanks.

## 2023-11-16 NOTE — Telephone Encounter (Signed)
 Informed patient per Dr. Gwenda Lema he needs to be seen in office to discuss CT seen last year and he also need pulmonary clearance prior to surgery. Express understanding.

## 2023-11-23 ENCOUNTER — Ambulatory Visit
Admit: 2023-11-23 | Discharge: 2023-11-23 | Payer: BLUE CROSS/BLUE SHIELD | Attending: Surgery | Primary: Family Medicine

## 2023-11-23 VITALS — BP 138/78 | HR 85 | Temp 98.00000°F | Resp 20 | Ht 69.0 in | Wt 268.4 lb

## 2023-11-23 DIAGNOSIS — K4091 Unilateral inguinal hernia, without obstruction or gangrene, recurrent: Secondary | ICD-10-CM

## 2023-11-23 NOTE — Progress Notes (Signed)
 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 But apparently had trouble maintaining his sats the last time he had attempted laparoscopic inguinal hernia repair.  With the apparent scar tissue in his left groin try to do a repeat open inguinal hernia repair on the left side would be difficult.  We ideally want to do this laparoscopically/robotically.     At the time we ordered a CT scan.  He just had that done last week.    He was also supposed to see pulmonology a year ago.  Still working on it.    Left groin. Hurting more  No nausea  Appetite ok  Some constipation      Physical Exam:   Abdominal exam: Soft, non-distended, non-tender.    Left groin: Large soft inguinal hernia.  No acute skin changes.  Reducible.      Jason Greene is having symptoms.  I have recommended to him that we proceed with surgery.     I had an extensive discussion with him regarding the risks, benefits, and alternatives of proceeding with a Laparoscopic recurrent left, Possible Bilateral Inguinal Hernia Repair with Mesh, Robot Assisted.  Risks,benefits, and alternatives were discussed including the risk of anesthesia, bleeding, infection, including mesh infection, chronic orchialgia, neuralgia, other pain syndromes, testicular ischemia, conversion to open, injury to bowel, and recurrence were discussed.    We will await a risk assessment with pulmonology.  Provide he is an acceptable risk, proceed with surgery.    I reviewed with Jason Greene his increased risk of bleeding secondary to Asprin use.  I have asked him to discontinue the Asprin for 2 days prior to surgery to reduce this risk.    Will continue Flomax  in the perioperative period to reduce the risk of postoperative urinary retention.    He expressed understanding of our discussion and is agreeable with the plan.        I had an extensive and  thorough discussion with Jason Greene regarding current diagnosis and treatment recommendations. Total time spend with him was 30 minutes.  This included the following:  preparing to see the patient (reviewing prior records and tests),  performing a medically appropriate examination and/or evaluation,  counseling and educating the patient/family/caregiver,  documenting clinical information in the electronic or other health record,  independently interpreting results and communicating results to the patient/family/caregiver,  ordering medications, tests, or procedures,  care coordination    OW

## 2023-11-23 NOTE — Addendum Note (Signed)
 Addended by: Mary Sly on: 11/23/2023 03:46 PM     Modules accepted: Orders

## 2023-11-23 NOTE — Progress Notes (Signed)
 Identified patient with two patient identifiers (name and DOB). Reviewed chart in preparation for visit and have obtained necessary documentation.    Bobbi Kozakiewicz is a 53 y.o. male  Chief Complaint   Patient presents with    Results     Discuss CT scan     BP 138/78   Pulse 85   Temp 98 F (36.7 C) (Oral)   Resp 20   Ht 1.753 m (5\' 9" )   Wt 121.7 kg (268 lb 6.4 oz)   SpO2 94%   BMI 39.64 kg/m     1. Have you been to the ER, urgent care clinic since your last visit?  Hospitalized since your last visit?no    2. Have you seen or consulted any other health care providers outside of the [facility] since your last visit?  Include any pap smears or colon screening. no

## 2023-11-27 NOTE — Telephone Encounter (Signed)
 Patient called to let MD know he is scheduled with cardiology on 6/17 and is needing cardiac clearance request faxed. Patient will be seeing Dr Julianna Ochs.

## 2023-12-08 NOTE — Telephone Encounter (Signed)
 Informed patient clearance request sent to pulmonologist Bard Molt. Express understanding.

## 2023-12-08 NOTE — Telephone Encounter (Signed)
 Patient called and is wanting to know if we received pulmonary clearance, please call      [phone]

## 2023-12-11 ENCOUNTER — Encounter

## 2023-12-11 NOTE — Telephone Encounter (Signed)
 Call out to Dr Jimmy office to see if he has been cleared for surgery or if they received the request for clearance. Had to leave a message

## 2023-12-19 NOTE — Telephone Encounter (Signed)
 Patient called and is needing a note for his employer stating when he is having surgery and about how long he will be out of work. Patient would like letter sent in mychart      Please call once completed  [phone]

## 2023-12-19 NOTE — Telephone Encounter (Signed)
 Returned patient call to advise a letter has been sent to his mychart at his recommendation. Advised patient what was in letter by reading to him what was written and advised if his work decided that was not enough to please call us  back.    Patient expressed understanding and was thankful for the call.

## 2023-12-27 NOTE — Other (Signed)
 [facility]  Preoperative Instructions        Surgery Date 01/03/24          Time of Arrival To Be Called (day prior between 2-5pm)  Contact: [phone]     On the day of your surgery, please report to Surgical Services Registration Desk and sign in at your designated time.  The Surgery Center is located to the right of the Emergency Room.     2. You must have someone with you to drive you home. You should not drive a car for 24 hours following surgery. Please make arrangements for a friend or family member to stay with you for the first 24 hours after your surgery.    3. Do not have anything to eat or drink (including water, gum, mints, coffee, juice) after 11:59 PM Tuesday July 15th.?This may not apply to medications prescribed by your physician. ?(Please note below the special instructions with medications to take the morning of your procedure.)    4. We recommend you do not drink any alcoholic beverages for 24 hours before and after your surgery.    5. Contact your surgeon's office for instructions on the following medications: non-steroidal anti-inflammatory drugs (i.e. Advil, Aleve), vitamins, and supplements. (Some surgeon's will want you to stop these medications prior to surgery and others may allow you to take them)  **If you are currently taking Plavix, Coumadin, Aspirin and/or other blood-thinning agents, contact your surgeon for instructions.** Your surgeon will partner with the physician prescribing these medications to determine if it is safe to stop or if you need to continue taking.  Please do not stop taking these medications without instructions from your surgeon    6. Wear comfortable clothes.  Wear glasses instead of contacts.  Do not bring any money or jewelry. Please bring picture ID, insurance card, and any prearranged co-payment or hospital payment.  Do not wear make-up, particularly mascara the morning of your surgery.  Do not wear nail polish, particularly if you are  having foot /hand surgery.  Wear your hair loose or down, no ponytails, buns, bobby pins or clips.  All body piercings must be removed. Please do not shave for 48 hours prior to surgery. Shaving of the face is acceptable. Please see the attached Soap/Hibiclens bathing instructions.    7. You should understand that if you do not follow these instructions your surgery may be cancelled.  If your physical condition changes (I.e. fever, cold or flu) please contact your surgeon as soon as possible.    8. It is important that you be on time.  If a situation occurs where you may be late, please call [phone] (OR Holding Area).    9. If you have any questions and or problems, please call [phone] (Pre-admission Testing).    10. Your surgery time may be subject to change.  You will receive a phone call the evening prior if your time changes.    11.  If having outpatient surgery, you must have someone to drive you here, stay with you during the duration of your stay, and to drive you home at time of discharge.       SPECIAL INSTRUCTIONS:     Please bring emergency albuterol  inhaler with you on your day of surgery.   Please hold aspirin for 2 days prior to surgery (last dose Sunday July 13th).       TAKE ALL MEDICATIONS DAY OF SURGERY EXCEPT: aspirin  I understand a pre-operative phone call will be made to verify my surgery time.  In the event that I am not available, I give permission for a message to be left on my answering service and/or with another person?  yes         ___________________      __________   _________    (Signature of Patient)             (Witness)                (Date and Time)

## 2023-12-27 NOTE — Other (Signed)
 Hibiclens/Chlorhexidine    Preventing Infections Before and After - Your Surgery    IMPORTANT INSTRUCTIONS    Please read and follow these instructions carefully. If you are unable to comply with the below instructions your procedure will be cancelled.       Every Night for Three (3) nights before your surgery:  Shower with an antibacterial soap, such as Dial, or the soap provided at your preassessment appointment. A shower is better than a bath for cleaning your skin.  If needed, ask someone to help you reach all areas of your body. Don't forget to clean your belly button with every shower.    The night before your surgery:   If you lose your Hibiclens/chlorhexidine please contact surgery center or you can purchase it at a local pharmacy  On the night before your surgery, shower with an antibacterial soap, such as Dial, or the soap provided at your preassessment appointment.   With bottle of Hibiclens/Chlorhexidine in hand, turn water off.  Apply Hibiclens antiseptic skin cleanser with a clean, freshly washed washcloth.  Gently apply to your body from chin to toes (except the genital area) and especially the area(s) where your incision(s) will be.  Leave Hibiclens/Chlorhexidine on your skin for at least 20 seconds.    CAUTION: If needed, Hibiclens/chlorhexidine may be used to clean the folds of skin of the legs (such as in the area of the groin) and on your buttocks and hips. However, do not use Hibiclens/Chlorhexidine above the neck or in the genital area (your bottom) or put inside any area of your body.  Turn the water back on and rinse.  Dry gently with a clean, freshly washed towel.  After your shower, do not use any powder, deodorant, perfumes or lotion.  Use clean, freshly washed towels and washcloths every time you shower.  Wear clean, freshly washed pajamas to bed the night before surgery.  Sleep on clean, freshly washed sheets.  Do not allow pets to sleep in your bed with you.        The Morning of your  surgery:  Shower again thoroughly with an antibacterial soap, such as Dial or the soap provided at your preassessment appointment. If needed, ask someone for help to reach all areas of your body. Don't forget to clean your belly button! Rinse.  With bottle of Hibiclens/Chlorhexidine in hand, turn water off.  Apply Hibiclens antiseptic skin cleanser with a clean, freshly washed washcloth.  Gently apply to your body from chin to toes (except the genital area) and especially the area(s) where your incision(s) will be.  Leave Hibiclens/Chlorhexidine on your skin for at least 20 seconds.     CAUTION: If needed, Hibiclens/chlorhexidine may be used to clean the folds of skin of the legs (such as in the area of the groin) and on your buttocks and hips. However, do not use Hibiclens/Chlorhexidine above the neck or in the genital area (your bottom) or put inside any area of your body.  Turn the water back on and rinse.  Dry gently with a clean, freshly washed towel.  After your shower, do not use any powder, deodorant, perfumes or lotion prior to surgery.  Put on clean, freshly washed clothing.    Tips to help prevent infections after your surgery:  Protect your surgical wound from germs:  Hand washing is the most important thing you and your caregivers can do to prevent infections.  Keep your bandage clean and dry!  Do not touch your surgical  wound.  Use clean, freshly washed towels and washcloths every time you shower; do not share bath linens with others.  Until your surgical wound is healed, wear clothing and sleep on bed linens each day that are clean and freshly washed.  Do not allow pets to sleep in your bed with you or touch your surgical wound.  Do not smoke - smoking delays wound healing. This may be a good time to stop smoking.  If you have diabetes, it is important for you to manage your blood sugar levels properly before your surgery as well as after your surgery. Poorly managed blood sugar levels slow down wound  healing and prevent you from healing completely.    If you lose your Hibiclens/chlorhexidine, please call the Surgery Center, or it is available for purchase at your pharmacy.               ___________________      ___________________      ________________  (Signature of Patient)          (Witness)                   (Date and Time)

## 2024-01-03 ENCOUNTER — Inpatient Hospital Stay: Payer: BLUE CROSS/BLUE SHIELD

## 2024-01-03 LAB — EKG 12-LEAD
Atrial Rate: 71 {beats}/min
Diagnosis: NORMAL
P Axis: 49 degrees
P-R Interval: 170 ms
Q-T Interval: 392 ms
QRS Duration: 124 ms
QTc Calculation (Bazett): 425 ms
R Axis: -38 degrees
T Axis: 5 degrees
Ventricular Rate: 71 {beats}/min

## 2024-01-03 LAB — POCT GLUCOSE
POC Glucose: 138 mg/dL — ABNORMAL HIGH (ref 65–117)
POC Glucose: 160 mg/dL — ABNORMAL HIGH (ref 65–117)

## 2024-01-03 MED ORDER — ACETAMINOPHEN 500 MG PO TABS
500 | Freq: Once | ORAL | Status: AC
Start: 2024-01-03 — End: 2024-01-03
  Administered 2024-01-03: 14:00:00 1000 mg via ORAL

## 2024-01-03 MED ORDER — SODIUM CHLORIDE 0.9 % IV SOLN
0.9 | INTRAVENOUS | Status: DC | PRN
Start: 2024-01-03 — End: 2024-01-03

## 2024-01-03 MED ORDER — LACTATED RINGERS IV SOLN
INTRAVENOUS | Status: DC
Start: 2024-01-03 — End: 2024-01-03

## 2024-01-03 MED ORDER — LIDOCAINE (CARDIAC) 100 MG/5ML IV SOLN (MIXTURES ONLY)
100 | Freq: Once | INTRAVENOUS | Status: DC | PRN
Start: 2024-01-03 — End: 2024-01-03
  Administered 2024-01-03: 15:00:00 100 via INTRAVENOUS

## 2024-01-03 MED ORDER — CEFAZOLIN SODIUM 1 G IJ SOLR
1 | INTRAMUSCULAR | Status: AC
Start: 2024-01-03 — End: ?

## 2024-01-03 MED ORDER — EPHEDRINE SULFATE (PRESSORS) 25 MG/5ML IV SOSY
25 | INTRAVENOUS | Status: AC
Start: 2024-01-03 — End: ?

## 2024-01-03 MED ORDER — DEXAMETHASONE SODIUM PHOSPHATE 4 MG/ML IJ SOLN
4 | Freq: Once | INTRAMUSCULAR | Status: DC | PRN
Start: 2024-01-03 — End: 2024-01-03

## 2024-01-03 MED ORDER — PROPOFOL 200 MG/20ML IV EMUL
200 | INTRAVENOUS | Status: AC
Start: 2024-01-03 — End: ?

## 2024-01-03 MED ORDER — PROCHLORPERAZINE EDISYLATE 10 MG/2ML IJ SOLN
10 | Freq: Once | INTRAMUSCULAR | Status: DC | PRN
Start: 2024-01-03 — End: 2024-01-03

## 2024-01-03 MED ORDER — SUCCINYLCHOLINE CHLORIDE 20 MG/ML IJ SOLN
20 | Freq: Once | INTRAMUSCULAR | Status: DC | PRN
Start: 2024-01-03 — End: 2024-01-03
  Administered 2024-01-03: 15:00:00 180 via INTRAVENOUS

## 2024-01-03 MED ORDER — ONDANSETRON HCL 4 MG/2ML IJ SOLN
4 | Freq: Once | INTRAMUSCULAR | Status: DC
Start: 2024-01-03 — End: 2024-01-03

## 2024-01-03 MED ORDER — ACETAMINOPHEN 325 MG PO TABS
325 | Freq: Four times a day (QID) | ORAL | 1.00 refills | 11.00000 days | Status: AC | PRN
Start: 2024-01-03 — End: ?

## 2024-01-03 MED ORDER — FENTANYL CITRATE (PF) 100 MCG/2ML IJ SOLN
100 | Freq: Once | INTRAMUSCULAR | Status: DC | PRN
Start: 2024-01-03 — End: 2024-01-03

## 2024-01-03 MED ORDER — LIDOCAINE (CARDIAC) 100 MG/5ML IV SOLN (MIXTURES ONLY)
100 | INTRAVENOUS | Status: AC
Start: 2024-01-03 — End: ?

## 2024-01-03 MED ORDER — NORMAL SALINE FLUSH 0.9 % IV SOLN
0.9 | INTRAVENOUS | Status: DC | PRN
Start: 2024-01-03 — End: 2024-01-03

## 2024-01-03 MED ORDER — ACETAMINOPHEN 500 MG PO TABS
500 | ORAL | Status: AC
Start: 2024-01-03 — End: ?

## 2024-01-03 MED ORDER — GLYCOPYRROLATE 0.2 MG/ML IJ SOLN
0.2 | INTRAMUSCULAR | Status: AC
Start: 2024-01-03 — End: ?

## 2024-01-03 MED ORDER — CEFAZOLIN SODIUM 1 G IJ SOLR
1 | Freq: Once | INTRAMUSCULAR | Status: DC | PRN
Start: 2024-01-03 — End: 2024-01-03
  Administered 2024-01-03: 15:00:00 3 via INTRAVENOUS

## 2024-01-03 MED ORDER — ROCURONIUM BROMIDE 50 MG/5ML IV SOLN
50 | INTRAVENOUS | Status: AC
Start: 2024-01-03 — End: ?

## 2024-01-03 MED ORDER — DEXAMETHASONE SODIUM PHOSPHATE 4 MG/ML IJ SOLN
4 | INTRAMUSCULAR | Status: AC
Start: 2024-01-03 — End: ?

## 2024-01-03 MED ORDER — BUPIVACAINE HCL (PF) 0.5 % IJ SOLN
0.5 | INTRAMUSCULAR | Status: DC | PRN
Start: 2024-01-03 — End: 2024-01-03
  Administered 2024-01-03: 15:00:00 13 via INTRADERMAL

## 2024-01-03 MED ORDER — PROPOFOL 200 MG/20ML IV EMUL
200 | Freq: Once | INTRAVENOUS | Status: DC | PRN
Start: 2024-01-03 — End: 2024-01-03
  Administered 2024-01-03: 15:00:00 350 via INTRAVENOUS

## 2024-01-03 MED ORDER — OXYCODONE HCL 5 MG PO TABS
5 | Freq: Once | ORAL | Status: DC | PRN
Start: 2024-01-03 — End: 2024-01-03

## 2024-01-03 MED ORDER — OXYCODONE HCL 5 MG PO TABS
5 | ORAL_TABLET | Freq: Four times a day (QID) | ORAL | 0 refills | 5.50000 days | Status: AC | PRN
Start: 2024-01-03 — End: 2024-01-08

## 2024-01-03 MED ORDER — ONDANSETRON HCL 4 MG/2ML IJ SOLN
4 | INTRAMUSCULAR | Status: AC
Start: 2024-01-03 — End: ?

## 2024-01-03 MED ORDER — SUGAMMADEX SODIUM 200 MG/2ML IV SOLN
200 | Freq: Once | INTRAVENOUS | Status: DC | PRN
Start: 2024-01-03 — End: 2024-01-03
  Administered 2024-01-03: 16:00:00 200 via INTRAVENOUS

## 2024-01-03 MED ORDER — BUPIVACAINE HCL (PF) 0.5 % IJ SOLN
0.5 | INTRAMUSCULAR | Status: AC
Start: 2024-01-03 — End: ?

## 2024-01-03 MED ORDER — ROCURONIUM BROMIDE 50 MG/5ML IV SOLN
50 | Freq: Once | INTRAVENOUS | Status: DC | PRN
Start: 2024-01-03 — End: 2024-01-03
  Administered 2024-01-03 (×2): 10 via INTRAVENOUS
  Administered 2024-01-03: 15:00:00 40 via INTRAVENOUS

## 2024-01-03 MED ORDER — ONDANSETRON HCL 4 MG/2ML IJ SOLN
4 | Freq: Once | INTRAMUSCULAR | Status: DC | PRN
Start: 2024-01-03 — End: 2024-01-03
  Administered 2024-01-03: 16:00:00 4 via INTRAVENOUS

## 2024-01-03 MED ORDER — BETHANECHOL CHLORIDE 10 MG PO TABS
10 | ORAL | Status: DC
Start: 2024-01-03 — End: 2024-01-03
  Administered 2024-01-03: 17:00:00 10 mg via ORAL

## 2024-01-03 MED ORDER — FENTANYL CITRATE (PF) 100 MCG/2ML IJ SOLN
100 | INTRAMUSCULAR | Status: AC
Start: 2024-01-03 — End: ?

## 2024-01-03 MED ORDER — HYDROMORPHONE HCL PF 1 MG/ML IJ SOLN
1 | INTRAMUSCULAR | Status: DC | PRN
Start: 2024-01-03 — End: 2024-01-03

## 2024-01-03 MED ORDER — DEXAMETHASONE SODIUM PHOSPHATE 4 MG/ML IJ SOLN
4 | Freq: Once | INTRAMUSCULAR | Status: DC | PRN
Start: 2024-01-03 — End: 2024-01-03
  Administered 2024-01-03: 15:00:00 10 via INTRAVENOUS

## 2024-01-03 MED ORDER — ACETAMINOPHEN 325 MG PO TABS
325 | Freq: Once | ORAL | Status: DC | PRN
Start: 2024-01-03 — End: 2024-01-03

## 2024-01-03 MED ORDER — DEXMEDETOMIDINE HCL 200 MCG/2ML IV SOLN
200 | Freq: Once | INTRAVENOUS | Status: DC | PRN
Start: 2024-01-03 — End: 2024-01-03
  Administered 2024-01-03: 16:00:00 6 via INTRAVENOUS

## 2024-01-03 MED ORDER — KETAMINE HCL 50 MG/5ML IJ SOSY
50 | Freq: Once | INTRAMUSCULAR | Status: DC | PRN
Start: 2024-01-03 — End: 2024-01-03
  Administered 2024-01-03: 16:00:00 15 via INTRAVENOUS
  Administered 2024-01-03: 16:00:00 25 via INTRAVENOUS

## 2024-01-03 MED ORDER — FENTANYL CITRATE (PF) 100 MCG/2ML IJ SOLN
100 | Freq: Once | INTRAMUSCULAR | Status: DC | PRN
Start: 2024-01-03 — End: 2024-01-03
  Administered 2024-01-03 (×2): 50 via INTRAVENOUS

## 2024-01-03 MED ORDER — LIDOCAINE-EPINEPHRINE 1 %-1:100000 IJ SOLN
1 | INTRAMUSCULAR | Status: AC
Start: 2024-01-03 — End: ?

## 2024-01-03 MED ORDER — LACTATED RINGERS IV SOLN
INTRAVENOUS | Status: DC | PRN
Start: 2024-01-03 — End: 2024-01-03
  Administered 2024-01-03: 15:00:00 via INTRAVENOUS

## 2024-01-03 MED ORDER — ONDANSETRON 4 MG PO TBDP
4 | ORAL_TABLET | Freq: Three times a day (TID) | ORAL | 0 refills | 8.50000 days | Status: AC | PRN
Start: 2024-01-03 — End: ?

## 2024-01-03 MED ORDER — FENTANYL CITRATE (PF) 100 MCG/2ML IJ SOLN
100 | INTRAMUSCULAR | Status: DC | PRN
Start: 2024-01-03 — End: 2024-01-03

## 2024-01-03 MED ORDER — TAMSULOSIN HCL 0.4 MG PO CAPS
0.4 | Freq: Once | ORAL | Status: AC
Start: 2024-01-03 — End: 2024-01-03
  Administered 2024-01-03: 17:00:00 0.4 mg via ORAL

## 2024-01-03 MED ORDER — NORMAL SALINE FLUSH 0.9 % IV SOLN
0.9 | Freq: Two times a day (BID) | INTRAVENOUS | Status: DC
Start: 2024-01-03 — End: 2024-01-03

## 2024-01-03 MED ORDER — PHENYLEPHRINE HCL (PRESSORS) 0.4 MG/10ML IV SOSY
0.4 | Freq: Once | INTRAVENOUS | Status: DC | PRN
Start: 2024-01-03 — End: 2024-01-03
  Administered 2024-01-03: 16:00:00 120 via INTRAVENOUS

## 2024-01-03 MED ORDER — EPINEPHRINE 5 MG IN NS 250 ML INFUSION
Status: AC
Start: 2024-01-03 — End: ?

## 2024-01-03 MED ORDER — PHENYLEPHRINE HCL (PRESSORS) 0.4 MG/10ML IV SOSY
0.4 | INTRAVENOUS | Status: AC
Start: 2024-01-03 — End: ?

## 2024-01-03 MED ORDER — MIDAZOLAM HCL (PF) 2 MG/2ML IJ SOLN
2 | Freq: Once | INTRAMUSCULAR | Status: DC | PRN
Start: 2024-01-03 — End: 2024-01-03

## 2024-01-03 MED ORDER — SODIUM CHLORIDE (PF) 0.9 % IJ SOLN
0.9 | INTRAMUSCULAR | Status: DC | PRN
Start: 2024-01-03 — End: 2024-01-03

## 2024-01-03 MED ORDER — KETAMINE HCL 50 MG/5ML IJ SOSY
50 | INTRAMUSCULAR | Status: AC
Start: 2024-01-03 — End: ?

## 2024-01-03 MED ORDER — HYDRALAZINE HCL 20 MG/ML IJ SOLN
20 | INTRAMUSCULAR | Status: DC | PRN
Start: 2024-01-03 — End: 2024-01-03

## 2024-01-03 MED ORDER — LACTATED RINGERS IV SOLN
INTRAVENOUS | Status: DC
Start: 2024-01-03 — End: 2024-01-03
  Administered 2024-01-03: 13:00:00 via INTRAVENOUS

## 2024-01-03 MED ORDER — POLYETHYLENE GLYCOL 3350 17 GM/SCOOP PO POWD
17 | Freq: Every day | ORAL | 0.00 refills | Status: AC
Start: 2024-01-03 — End: 2024-01-10

## 2024-01-03 MED ORDER — MIDAZOLAM HCL 2 MG/2ML IJ SOLN
2 | INTRAMUSCULAR | Status: AC
Start: 2024-01-03 — End: ?

## 2024-01-03 MED ORDER — MIDAZOLAM HCL 2 MG/2ML IJ SOLN
2 | Freq: Once | INTRAMUSCULAR | Status: DC | PRN
Start: 2024-01-03 — End: 2024-01-03
  Administered 2024-01-03: 15:00:00 2 via INTRAVENOUS

## 2024-01-03 MED FILL — ROCURONIUM BROMIDE 50 MG/5ML IV SOLN: 50 MG/5ML | INTRAVENOUS | Qty: 5

## 2024-01-03 MED FILL — KETAMINE HCL 50 MG/5ML IJ SOSY: 50 MG/5ML | INTRAMUSCULAR | Qty: 5

## 2024-01-03 MED FILL — FLOMAX 0.4 MG PO CAPS: 0.4 mg | ORAL | Qty: 1

## 2024-01-03 MED FILL — ROCURONIUM BROMIDE 50 MG/5ML IV SOLN: 50 MG/5ML | INTRAVENOUS | Qty: 15

## 2024-01-03 MED FILL — FENTANYL CITRATE (PF) 100 MCG/2ML IJ SOLN: 100 MCG/2ML | INTRAMUSCULAR | Qty: 2

## 2024-01-03 MED FILL — ROCURONIUM BROMIDE 50 MG/5ML IV SOLN: 50 MG/5ML | INTRAVENOUS | Qty: 10

## 2024-01-03 MED FILL — DEXAMETHASONE SODIUM PHOSPHATE 4 MG/ML IJ SOLN: 4 mg/mL | INTRAMUSCULAR | Qty: 2

## 2024-01-03 MED FILL — SENSORCAINE-MPF 0.5 % IJ SOLN: 0.5 % | INTRAMUSCULAR | Qty: 30

## 2024-01-03 MED FILL — CEFAZOLIN SODIUM 1 G IJ SOLR: 1 g | INTRAMUSCULAR | Qty: 1000

## 2024-01-03 MED FILL — ONDANSETRON HCL 4 MG/2ML IJ SOLN: 4 MG/2ML | INTRAMUSCULAR | Qty: 2

## 2024-01-03 MED FILL — PHENYLEPHRINE HCL (PRESSORS) 0.4 MG/10ML IV SOSY: 0.4 MG/10ML | INTRAVENOUS | Qty: 10

## 2024-01-03 MED FILL — DIPRIVAN 200 MG/20ML IV EMUL: 200 MG/20ML | INTRAVENOUS | Qty: 20

## 2024-01-03 MED FILL — ACETAMINOPHEN 500 MG PO TABS: 500 mg | ORAL | Qty: 2

## 2024-01-03 MED FILL — BETHANECHOL CHLORIDE 10 MG PO TABS: 10 mg | ORAL | Qty: 1

## 2024-01-03 MED FILL — MIDAZOLAM HCL 2 MG/2ML IJ SOLN: 2 mg/mL | INTRAMUSCULAR | Qty: 2

## 2024-01-03 MED FILL — AKOVAZ 25 MG/5ML IV SOSY: 25 MG/5ML | INTRAVENOUS | Qty: 5

## 2024-01-03 MED FILL — EPINEPHRINE 5 MG IN NS 250 ML INFUSION: Qty: 250

## 2024-01-03 MED FILL — LIDOCAINE-EPINEPHRINE 1 %-1:100000 IJ SOLN: 1 %-:00000 | INTRAMUSCULAR | Qty: 20

## 2024-01-03 MED FILL — CEFAZOLIN SODIUM 1 G IJ SOLR: 1 g | INTRAMUSCULAR | Qty: 2000

## 2024-01-03 MED FILL — DEXAMETHASONE SODIUM PHOSPHATE 4 MG/ML IJ SOLN: 4 mg/mL | INTRAMUSCULAR | Qty: 1

## 2024-01-03 MED FILL — GLYCOPYRROLATE 0.2 MG/ML IJ SOLN: 0.2 mg/mL | INTRAMUSCULAR | Qty: 1

## 2024-01-03 MED FILL — LIDOCAINE HCL (CARDIAC) PF 100 MG/5ML IV SOSY: 100 MG/5ML | INTRAVENOUS | Qty: 5

## 2024-01-03 NOTE — Anesthesia Pre-Procedure Evaluation (Signed)
 Department of Anesthesiology  Preprocedure Note       Name:  Jason Greene   Age:  53 y.o.  DOB:  1971-02-13                                          MRN:  [id number]         Date:  01/03/2024      Surgeon: Clotilde):  Geni Derick SAILOR, MD    Procedure: Procedure(s):  ROBOT ASSISTED LAPAROSCOPIC RECURRENT LEFT, POSSIBLE BILATERAL INGUINAL HERNIA REPAIR WITH MESH    Medications prior to admission:   Prior to Admission medications    Medication Sig Start Date End Date Taking? Authorizing Provider   Budeson-Glycopyrrol-Formoterol (BREZTRI AEROSPHERE) 160-9-4.8 MCG/ACT AERO Inhale 2 puffs into the lungs daily   Yes [provider]   acetaminophen  (TYLENOL ) 325 MG tablet Take 2 tablets by mouth every 6 hours as needed for Pain   Yes [provider]   albuterol  sulfate HFA (PROVENTIL ;VENTOLIN ;PROAIR ) 108 (90 Base) MCG/ACT inhaler Inhale 2 puffs into the lungs every 4 hours as needed 09/06/23   [provider]   montelukast (SINGULAIR) 10 MG tablet Take 1 tablet by mouth nightly 08/31/23   [provider]   pantoprazole  (PROTONIX ) 40 MG tablet Take 1 tablet by mouth 2 times daily 09/10/23   [provider]   tamsulosin  (FLOMAX ) 0.4 MG capsule Take 1 capsule by mouth daily 07/18/23   Kennie Darin MATSU, MD   atorvastatin  (LIPITOR) 20 MG tablet Take 1 tablet by mouth every evening 07/07/23   Enowtaku, Andre G, MD   metFORMIN  (GLUCOPHAGE ) 500 MG tablet Take 1 tablet by mouth daily  Patient taking differently: Take 1 tablet by mouth nightly 07/07/23   Enowtaku, Andre G, MD   metoprolol  succinate (TOPROL  XL) 50 MG extended release tablet Take 1 tablet by mouth daily After a meal 07/07/23   Kennie Darin MATSU, MD   valsartan -hydroCHLOROthiazide  (DIOVAN -HCT) 160-25 MG per tablet Take 1 tablet by mouth daily 07/07/23   Enowtaku, Andre G, MD   aspirin 81 MG EC tablet Take by mouth daily 10/24/18   [provider]   fluticasone  (FLONASE) 50 MCG/ACT nasal spray by Nasal route daily as needed for  Allergies    [provider]       Current medications:    No current facility-administered medications for this encounter.     Current Outpatient Medications   Medication Sig Dispense Refill   . Budeson-Glycopyrrol-Formoterol (BREZTRI AEROSPHERE) 160-9-4.8 MCG/ACT AERO Inhale 2 puffs into the lungs daily     . acetaminophen  (TYLENOL ) 325 MG tablet Take 2 tablets by mouth every 6 hours as needed for Pain     . albuterol  sulfate HFA (PROVENTIL ;VENTOLIN ;PROAIR ) 108 (90 Base) MCG/ACT inhaler Inhale 2 puffs into the lungs every 4 hours as needed     . montelukast (SINGULAIR) 10 MG tablet Take 1 tablet by mouth nightly     . pantoprazole  (PROTONIX ) 40 MG tablet Take 1 tablet by mouth 2 times daily     . tamsulosin  (FLOMAX ) 0.4 MG capsule Take 1 capsule by mouth daily 30 capsule 0   . atorvastatin  (LIPITOR) 20 MG tablet Take 1 tablet by mouth every evening 90 tablet 3   . metFORMIN  (GLUCOPHAGE ) 500 MG tablet Take 1 tablet by mouth daily (Patient taking differently: Take 1 tablet by mouth nightly) 90 tablet 3   .  metoprolol  succinate (TOPROL  XL) 50 MG extended release tablet Take 1 tablet by mouth daily After a meal 90 tablet 3   . valsartan -hydroCHLOROthiazide  (DIOVAN -HCT) 160-25 MG per tablet Take 1 tablet by mouth daily 90 tablet 3   . aspirin 81 MG EC tablet Take by mouth daily     . fluticasone  (FLONASE) 50 MCG/ACT nasal spray by Nasal route daily as needed for Allergies         Allergies:    Allergies   Allergen Reactions   . Lisinopril Cough       Problem List:    Patient Active Problem List   Diagnosis Code   . Allergic rhinitis J30.9   . Asthma J45.909   . Chronic heart failure with preserved ejection fraction (HFpEF) (HCC) I50.32   . Diabetes mellitus (HCC) E11.9   . Erectile dysfunction N52.9   . Gastroesophageal reflux disease without esophagitis K21.9   . Essential hypertension I10   . Hyperlipidemia E78.5   . Obstructive sleep apnea syndrome G47.33   . Morbid obesity (HCC) E66.01   . Palpitations  R00.2   . Vaccine counseling Z71.85   . DOE (dyspnea on exertion) R06.09   . Nonrheumatic mitral valve regurgitation I34.0   . Acute upper respiratory infection J06.9   . Left ear pain H92.02   . Otitis externa H60.90   . Dysuria R30.0   . Recurrent left inguinal hernia K40.91       Past Medical History:        Diagnosis Date   . Asthma    . Diabetes (HCC)    . Erectile dysfunction    . GERD (gastroesophageal reflux disease)    . High cholesterol    . Hypertension     Dr. Lindle   . Obesity    . OSA (obstructive sleep apnea)     uses CPAP       Past Surgical History:        Procedure Laterality Date   . CHOLECYSTECTOMY     . COLONOSCOPY     . HERNIA REPAIR     . LUMBAR SPINE SURGERY  1996   . TONSILLECTOMY     . UPPER GASTROINTESTINAL ENDOSCOPY         Social History:    Social History     Tobacco Use   . Smoking status: Former     Current packs/day: 0.00     Average packs/day: 0.5 packs/day for 15.0 years (7.5 ttl pk-yrs)     Types: Cigarettes     Start date: 10/03/1986     Quit date: 10/02/2001     Years since quitting: 22.2     Passive exposure: Past   . Smokeless tobacco: Never   Substance Use Topics   . Alcohol use: Yes     Alcohol/week: 7.0 standard drinks of alcohol     Types: 7 Glasses of wine per week     Comment: every day                                Counseling given: Not Answered      Vital Signs (Current):   Vitals:    12/27/23 1547   Weight: 121.6 kg (268 lb)   Height: 1.753 m (5' 9)  BP Readings from Last 3 Encounters:   11/23/23 138/78   09/30/23 125/78   08/31/23 105/61       NPO Status:                                                                                 BMI:   Wt Readings from Last 3 Encounters:   11/23/23 121.7 kg (268 lb 6.4 oz)   09/30/23 120.9 kg (266 lb 9.6 oz)   08/31/23 126.1 kg (278 lb)     Body mass index is 39.58 kg/m.    CBC:   Lab Results   Component Value Date/Time    WBC 5.2 07/19/2022 09:25 AM    RBC 4.64 07/19/2022 09:25 AM     HGB 13.6 07/19/2022 09:25 AM    HCT 42.5 07/19/2022 09:25 AM    MCV 91.6 07/19/2022 09:25 AM    RDW 12.3 07/19/2022 09:25 AM    PLT 278 07/19/2022 09:25 AM       CMP:   Lab Results   Component Value Date/Time    NA 139 07/19/2022 09:25 AM    K 3.9 07/19/2022 09:25 AM    CL 104 07/19/2022 09:25 AM    CO2 28 07/19/2022 09:25 AM    BUN 12 07/19/2022 09:25 AM    CREATININE 0.70 11/15/2023 01:59 PM    CREATININE 0.85 07/19/2022 09:25 AM    AGRATIO 1.1 09/13/2021 10:34 AM    LABGLOM >60 07/19/2022 09:25 AM    LABGLOM >60 09/13/2021 10:34 AM    GLUCOSE 123 07/19/2022 09:25 AM    CALCIUM  9.4 07/19/2022 09:25 AM    BILITOT 0.7 07/19/2022 09:25 AM    ALKPHOS 52 07/19/2022 09:25 AM    ALKPHOS 61 09/13/2021 10:34 AM    AST 14 07/19/2022 09:25 AM    ALT 38 07/19/2022 09:25 AM       POC Tests: No results for input(s): POCGLU, POCNA, POCK, POCCL, POCBUN, POCHEMO, POCHCT in the last 72 hours.    Coags: No results found for: PROTIME, INR, APTT    HCG (If Applicable): No results found for: PREGTESTUR, PREGSERUM, HCG, HCGQUANT     ABGs: No results found for: PHART, PO2ART, PCO2ART, HCO3ART, BEART, O2SATART     Type & Screen (If Applicable):  No results found for: ABORH, LABANTI    Drug/Infectious Status (If Applicable):  No results found for: HIV, HEPCAB    COVID-19 Screening (If Applicable): No results found for: COVID19        Anesthesia Evaluation  Patient summary reviewed and Nursing notes reviewed  Airway: Mallampati: III     Neck ROM: limited  Mouth opening: > = 3 FB   Dental: normal exam         Pulmonary:Negative Pulmonary ROS and normal exam  breath sounds clear to auscultation  (+)   shortness of breath: chronic,   sleep apnea: on CPAP,       asthma:                            Cardiovascular:Negative CV ROS    (+) hypertension: moderate, DOE: after ambulating 1 flight  of stairs        Rhythm: regular  Rate: normal                    Neuro/Psych:   Negative Neuro/Psych  ROS              GI/Hepatic/Renal: Neg GI/Hepatic/Renal ROS  (+) GERD: poorly controlled, morbid obesity          Endo/Other: Negative Endo/Other ROS   (+) DiabetesType II DM.                 Abdominal:             Vascular: negative vascular ROS.         Other Findings:       Anesthesia Plan      general     ASA 2       Induction: intravenous.      Anesthetic plan and risks discussed with patient.                    Francis FORBES Fenton, MD   01/03/2024

## 2024-01-03 NOTE — Discharge Instructions (Addendum)
 Dr. Illene Discharge Instructions for:  Jason Greene    MRN: [id number] DOB: [birth year]    Admitted: 01/03/2024  Discharged: 01/03/2024     ** ok to restart aspirin on Sunday 7/20 **    What to do at Home    Recommended diet: Resume your usual diet    Recommended activity: Lift less than 10 lbs for 4 weeks.    Follow-up with Dr. Geni in 2-3 weeks.  Call [phone] for an appointment.      Inguinal Hernia Repair: What to Expect at Home     Your Recovery    You are likely to have pain for the next few days. You may also feel like you have the flu, and you may have a low fever and feel tired and nauseated. This is common.    You should feel better after a few days and will probably feel much better in 7 days. For several weeks you may feel twinges or pulling in the hernia repair when you move. You may have some bruising on the scrotum and along the penis. This is normal.    Men will need to wear a jockstrap or briefs, not boxers, for scrotal support for several days after a groin (inguinal) hernia repair. Spandex bicycle shorts may provide good support.    This care sheet gives you a general idea about how long it will take for you to recover. But each person recovers at a different pace. Follow the steps below to get better as quickly as possible.    How can you care for yourself at home?    Activity  Rest when you feel tired. Getting enough sleep will help you recover. You can try to sleep with your head up in a recliner chair if that is more comfortable.   Try to walk each day. Start by walking a little more than you did the day before. Bit by bit, increase the amount you walk. Walking boosts blood flow and helps prevent pneumonia and constipation.  Put ice or a cold pack on the area of your hernia repair for 10 to 15 minutes at a time. Try to do this every 1 to 2 hours for the first 24 hours (when you are awake) or until the swelling goes down. Put a thin cloth between the ice and your skin.  Avoid strenuous  activities, such as biking, jogging, weight lifting, or aerobic exercise, until your doctor says it is okay.  Avoid lifting anything that would make you strain. This may include heavy grocery bags and milk containers, a heavy briefcase or backpack, cat litter or dog food bags, a child, or a vacuum cleaner.  You may drive when you are no longer taking pain medicine and can quickly move your foot from the gas pedal to the brake. You must also be able to sit comfortably for a long period of time.  Most people are able to return to work within 1 to 2 weeks after surgery.  You may shower. Pat the cut (incision) dry. Do not take a bath for 2 weeks.  You can have sex again when it feels comfortable to do so.    Diet  You can eat your normal diet. If your stomach is upset, try bland, low-fat foods like plain rice, broiled chicken, toast, and yogurt.  Drink plenty of fluids (unless your doctor tells you not to).  You may notice that your bowel movements are not regular right after your surgery. This is  common. Avoid constipation and straining with bowel movements. You may want to take a fiber supplement every day. If you have not had a bowel movement after a couple of days, ask your doctor about taking a mild laxative.    Medicines  Take pain medicines exactly as directed.  If the doctor gave you a prescription medicine for pain, take it as prescribed.  If you are not taking a prescription pain medicine, take an over-the-counter medicine such as acetaminophen  (Tylenol ), ibuprofen (Advil, Motrin), or naproxen (Aleve). Read and follow all instructions on the label.  Do not take two or more pain medicines at the same time unless the doctor told you to. Many pain medicines have acetaminophen , which is Tylenol . Too much acetaminophen  (Tylenol ) can be harmful.  If your doctor prescribed antibiotics, take them as directed. Do not stop taking them just because you feel better. You need to take the full course of antibiotics.  If you  think your pain medicine is making you sick to your stomach:  Take your medicine after meals (unless your doctor has told you not to).  Ask your doctor for a different pain medicine.    Incision care  If you have strips of tape on the cut (incision) the doctor made, leave the tape on for a week or until it falls off.  If you have staples closing the cut, you will need to visit your doctor in 1 to 2 weeks to have them removed.  Wash the area daily with warm, soapy water and pat it dry.    Follow-up care is a key part of your treatment and safety. Be sure to make and go to all appointments, and call your doctor if you are having problems. It's also a good idea to know your test results and keep a list of the medicines you take.    When should you call for help?    Call 911 anytime you think you may need emergency care. For example, call if:  You pass out (lose consciousness).  You have sudden chest pain and shortness of breath, or you cough up blood.  You have severe pain in your belly.    Call your doctor now or seek immediate medical care if:  You are sick to your stomach and cannot keep fluids down.  You have signs of a blood clot, such as:  Pain in your calf, back of the knee, thigh, or groin.  Redness and swelling in your leg or groin.  You have trouble passing urine or stool, especially if you have mild pain or swelling in your lower belly.  You have hot flashes, sweating, flushing, or a fast or pounding heartbeat.  Bright red blood has soaked through the bandage over your incision.    Watch closely for any changes in your health, and be sure to contact your doctor if:  Your swelling is getting worse.  Your swelling is not going down.    DISCHARGE SUMMARY from Nurse    PATIENT INSTRUCTIONS:    After general anesthesia or intravenous sedation, for 24 hours or while taking prescription narcotics:    Have someone responsible help you with your care  Limit your activities  Do not drive and operate hazardous  machinery  Do not make important personal, legal or business decisions  Do not drink alcoholic beverages  If you have not urinated within 8 hours after discharge, please contact your surgeon on call  Resume your medications unless otherwise instructed  From general anesthesia, intravenous sedation, or while taking prescription narcotics, you may experience:    Drowsiness, dizziness, sleepiness, or confusion  Difficulty remembering or delayed reaction times  Difficulty with your balance, especially while walking, move slowly and carefully, do not make sudden position changes  Difficulty focusing or blurred vision    You may not be aware of slight changes in your behavior and/or your reaction time because of the medication used during and after your procedure.    Report the following to your surgeon:  Excessive pain, swelling, redness or odor of or around the surgical area  Temperature over 100.5  Nausea and vomiting lasting longer than 4 hours or if unable to take medications  Any signs of decreased circulation or nerve impairment to extremity: change in color, persistent numbness, tingling, coldness or increase pain  Any questions or concerns         IF YOU REPORT TO AN EMERGENCY ROOM, DOCTOR'S OFFICE OR HOSPITAL WITHIN 24 HOURS AFTER YOUR PROCEDURE, BRING THIS SHEET AND YOUR AFTER VISIT SUMMARY WITH YOU AND GIVE IT TO THE PHYSICIAN OR NURSE ATTENDING YOU.    These are general instructions for a healthy lifestyle (if applicable):    No smoking/ No tobacco products/ Avoid exposure to secondhand smoke  Surgeon General's Warning:  Quitting smoking now greatly reduces serious risk to your health.    Obesity, smoking, and sedentary lifestyle greatly increases your risk for illness    A healthy diet, regular physical exercise & weight monitoring are important for maintaining a healthy lifestyle    You may be retaining fluid if you have a history of heart failure or if you experience any of the following symptoms:   Weight gain of 3 pounds or more overnight or 5 pounds in a week, increased swelling in our hands or feet or shortness of breath while lying flat in bed.  Please call your doctor as soon as you notice any of these symptoms; do not wait until your next office visit.    A common side effect of anesthesia following surgery is nausea and/or vomiting. In order to decrease symptoms, it is wise to avoid foods that are high in fat, greasy foods, milk products, and spicy foods for the first 24 hours.    Acceptable foods for the first 24 hours following surgery include but are not limited to:    soup  broth  toast   crackers   applesauce  bananas   mashed potatoes,  soft or scrambled eggs  oatmeal  jello    It is important to eat when taking your pain medication. This will help to prevent nausea. If possible, please try to time your meals with your medications.    It is very important to stay hydrated following surgery. Sip fluids frequently while awake. Avoid acidic drinks such as citrus juices and soda for 24 hours. Carbonated beverages may cause bloating and gas. Acceptable fluids include:    water (flavor packets may add variety)  coffee or tea (in moderation)  Gatorade  Kool-Aid  apple juice  cranberry juice    You are encouraged to cough and deep breathe every hour when awake. This will help to prevent respiratory complications following anesthesia. You may want to hug a pillow when coughing and sneezing to add additional support to the surgical area and to decrease discomfort if you had abdominal or chest surgery.    If you are discharged home with support stockings, you may remove them after 24  hours. Support stockings are used to help prevent blood clots in the legs following surgery.    TO PREVENT AN INFECTION      WASH YOUR HANDS    To prevent infection, good handwashing is the most important thing you or your caregiver can do.      Wash your hands with soap and water or use the hand sanitizer we gave you before you  touch any wounds.    SHOWER    Use the antibacterial soap we gave you when you take a shower.     Shower with this soap until your wounds are healed.      To reach all areas of your body, you may need someone to help you.     Don't forget to clean your belly button with every shower.     USE CLEAN SHEETS    Use freshly cleaned sheets on your bed after surgery.     To keep the surgery site clean, do not allow pets to sleep with you while your wound is still healing.     STOP SMOKING    Stop smoking, or at least cut back on smoking    Smoking slows your healing.      CONTROL YOUR BLOOD SUGAR    High blood sugars slow wound healing.    If you are diabetic, control your blood sugar levels before and after your surgery.    Please take time to review all of your Home Care Instructions and Medication Information sheets provided in your discharge packet. If you have any questions, please contact your surgeon's office. Thank you.    The discharge information has been reviewed with the patient and instruction recipient.  The patient and instruction recipient verbalized understanding.  Discharge medications reviewed with the patient and instruction recipient and appropriate educational materials and side effects teaching were provided.      Please provide this summary of care documentation to your next provider.                              How to Care for Your Wound After It's Treated With  DERMABOND* Topical Skin Adhesive    DERMABOND* Topical Skin Adhesive (2-octyl cyanoacrylate) is a sterile, liquid skin adhesive  that holds wound edges together. The film will usually remain in place for 5 to 10 days, then  naturally fall off your skin.  The following will answer some of your questions and provide instructions for proper care for your  wound while it is healing:    CHECK WOUND APPEARANCE   Some swelling, redness, and pain are common with all wounds and normally will go away as the  wound heals. If swelling, redness, or  pain increases or if the wound feels warm to the touch,  contact a doctor. Also contact a doctor if the wound edges reopen or separate.    REPLACE BANDAGES   If your wound is bandaged, keep the bandage dry.   Replace the dressing daily until the adhesive film has fallen off or if the  bandage should become wet, unless otherwise instructed by your  physician.   When changing the dressing, do not place tape directly over the  DERMABOND adhesive film, because removing the tape later may also  remove the film.    AVOID TOPICAL MEDICATIONS   Do not apply liquid or ointment medications or any other product to your wound while the  DERMABOND adhesive film is in place. These may loosen the film before your wound is healed.    KEEP WOUND DRY AND PROTECTED   You may occasionally and briefly wet your wound in the shower or bath. Do not soak or scrub  your wound, do not swim, and avoid periods of heavy perspiration until the DERMABOND  adhesive has naturally fallen off. After showering or bathing, gently blot your wound dry with a  soft towel. If a protective dressing is being used, apply a fresh, dry bandage, being sure to keep  the tape off the DERMABOND adhesive film.   Apply a clean, dry bandage over the wound if necessary to protect it.   Protect your wound from injury until the skin has had sufficient time to heal.   Do not scratch, rub, or pick at the DERMABOND adhesive film. This may loosen the film before  your wound is healed.   Protect the wound from prolonged exposure to sunlight or tanning lamps while the film is in  place.    If you have any questions or concerns about this product, please consult your doctor.  [employer]

## 2024-01-03 NOTE — Anesthesia Post-Procedure Evaluation (Signed)
 Department of Anesthesiology  Postprocedure Note    Patient: Jason Greene  MRN: [id number]  Birthdate: 22-Mar-1971  Date of evaluation: 01/03/2024    Procedure Summary       Date: 01/03/24 Room / Location: [facility] M[facility]    Anesthesia Start: 1102 Anesthesia Stop: 1249    Procedure: ROBOT ASSISTED LAPAROSCOPIC RECURRENT LEFT INGUINAL HERNIA REPAIR WITH MESH (Abdomen) Diagnosis:       Recurrent left inguinal hernia      (Recurrent left inguinal hernia [K40.91])    Providers: Geni Derick SAILOR, MD Responsible Provider: Clyde Francis BRAVO, MD    Anesthesia Type: General ASA Status: 2            Anesthesia Type: General    Aldrete Phase I:      Aldrete Phase II:      Anesthesia Post Evaluation    Patient location during evaluation: PACU  Patient participation: complete - patient participated  Level of consciousness: sleepy but conscious and responsive to verbal stimuli  Pain score: 2  Airway patency: patent  Nausea & Vomiting: no vomiting and no nausea  Cardiovascular status: blood pressure returned to baseline and hemodynamically stable  Respiratory status: acceptable  Hydration status: stable  Multimodal analgesia pain management approach  Pain management: adequate    No notable events documented.

## 2024-01-03 NOTE — H&P (Signed)
 Pre-Op History and Physical    Subjective:     Jason Greene is a 53 y.o. male who is here for Procedure(s):  ROBOT ASSISTED LAPAROSCOPIC RECURRENT LEFT, POSSIBLE BILATERAL INGUINAL HERNIA REPAIR WITH MESH    Past Medical History:   Diagnosis Date    Asthma     Diabetes (HCC)     oral agents    Erectile dysfunction     GERD (gastroesophageal reflux disease)     High cholesterol     Hypertension     Dr. Lindle    Obesity     OSA (obstructive sleep apnea)     uses CPAP      Past Surgical History:   Procedure Laterality Date    CHOLECYSTECTOMY      COLONOSCOPY      HERNIA REPAIR      LUMBAR SPINE SURGERY  1996    TONSILLECTOMY      UPPER GASTROINTESTINAL ENDOSCOPY       Family History   Problem Relation Age of Onset    Hypertension Mother     Cancer Father     No Known Problems Son     No Known Problems Son     No Known Problems Daughter     No Known Problems Daughter       Social History     Tobacco Use    Smoking status: Former     Current packs/day: 0.00     Average packs/day: 0.5 packs/day for 15.0 years (7.5 ttl pk-yrs)     Types: Cigarettes     Start date: 10/03/1986     Quit date: 10/02/2001     Years since quitting: 22.2     Passive exposure: Past    Smokeless tobacco: Never   Substance Use Topics    Alcohol use: Yes     Alcohol/week: 7.0 standard drinks of alcohol     Types: 7 Glasses of wine per week     Comment: every day       Prior to Admission medications    Medication Sig Start Date End Date Taking? Authorizing Provider   Budeson-Glycopyrrol-Formoterol (BREZTRI AEROSPHERE) 160-9-4.8 MCG/ACT AERO Inhale 2 puffs into the lungs daily   Yes [provider]   acetaminophen  (TYLENOL ) 325 MG tablet Take 2 tablets by mouth every 6 hours as needed for Pain   Yes [provider]   albuterol  sulfate HFA (PROVENTIL ;VENTOLIN ;PROAIR ) 108 (90 Base) MCG/ACT inhaler Inhale 2 puffs into the lungs every 4 hours as needed 09/06/23  Yes [provider]   montelukast (SINGULAIR) 10 MG tablet Take 1  tablet by mouth nightly 08/31/23  Yes [provider]   pantoprazole  (PROTONIX ) 40 MG tablet Take 1 tablet by mouth 2 times daily 09/10/23  Yes [provider]   tamsulosin  (FLOMAX ) 0.4 MG capsule Take 1 capsule by mouth daily 07/18/23  Yes Enowtaku, Darin MATSU, MD   atorvastatin  (LIPITOR) 20 MG tablet Take 1 tablet by mouth every evening 07/07/23  Yes Enowtaku, Andre G, MD   metFORMIN  (GLUCOPHAGE ) 500 MG tablet Take 1 tablet by mouth daily  Patient taking differently: Take 1 tablet by mouth nightly 07/07/23  Yes Kennie Darin MATSU, MD   metoprolol  succinate (TOPROL  XL) 50 MG extended release tablet Take 1 tablet by mouth daily After a meal 07/07/23  Yes Enowtaku, Darin MATSU, MD   valsartan -hydroCHLOROthiazide  (DIOVAN -HCT) 160-25 MG per tablet Take 1 tablet by mouth daily 07/07/23  Yes Enowtaku, Darin MATSU, MD  aspirin 81 MG EC tablet Take by mouth daily 10/24/18  Yes [provider]   fluticasone  (FLONASE) 50 MCG/ACT nasal spray by Nasal route daily as needed for Allergies   Yes [provider]     Allergies   Allergen Reactions    Lisinopril Cough        Review of Systems:  A comprehensive review of systems was negative except for that written in the History of Present Illness.     Objective:     Intake and Output:    No intake/output data recorded.  No intake/output data recorded.    Physical Exam:   Lungs: clear to auscultation bilaterally  Heart: regular rate and rhythm  Abdomen: soft, non-tender. No masses,  no organomegaly        Data Review:   Recent Results (from the past 24 hours)   POCT Glucose    Collection Time: 01/03/24  9:23 AM   Result Value Ref Range    POC Glucose 138 (H) 65 - 117 mg/dL    Performed by: Wynona Krabbe            Assessment:     Principal Problem:    Recurrent left inguinal hernia  Resolved Problems:    * No resolved hospital problems. *      Plan:     I had an extensive discussion with Krystal Browner regarding the risks, benefits, and alternatives of proceeding  with a  Procedure(s):  ROBOT ASSISTED LAPAROSCOPIC RECURRENT LEFT, POSSIBLE BILATERAL INGUINAL HERNIA REPAIR WITH MESH.  Risks, benefits, and alternatives of surgery including the risk of anesthesia, bleeding, infection, injury to bowel, recurrence, chronic pain, and the lack of symptomatic improvement were discussed and Woodard Perrell is in agreement to proceed.    Site examined and marked.        Signed By: Derick Hoose, MD     January 03, 2024

## 2024-01-03 NOTE — Op Note (Addendum)
 OPERATIVE REPORT  01/03/2024    PREOPERATIVE DIAGNOSES:    Recurrent left inguinal hernia.      POSTOPERATIVE DIAGNOSES:   large LEFT indirect inguinal hernia.      OPERATIVE PROCEDURE:  1.  Laparoscopic repair of left inguinal hernia with mesh, robot assisted.    SURGEON:  Derick GEANNIE Hoose, MD    OR STAFF: Circulator: Carmelia Kaylee FALCON, RN  Surgical Assistant: Herschell Barter  Relief Circulator: Burnetta Garre, RN  Relief Scrub: Daniel Belt  Scrub Person First: Zackary Miners  Relief Surgical Assistant: Oneita Sherrilyn Oneita Nolene    ANESTHESIA:  General plus local.    Specimens: * No specimens in log *     COMPLICATIONS:  None.    ESTIMATED BLOOD LOSS:  Minimal.    Implants:   Implant Name Type Inv. Item Serial No. Manufacturer Lot No. LRB No. Used Action   MESH HERN [id number] RECT [employer] POLYPR - [id number]  MESH HERN [id number] RECT [employer] POLYPR [id number] [employer]WD NA Left 1 Implanted       INDICATION FOR PROCEDURE: Jason Greene is a 53 y.o. male presented to the office with groin pain.  On exam, he was noted to have a hernia. He is brought to the operating room for repair. Risks, benefits and alternatives were discussed. Consent form was placed in the chart.    DESCRIPTION OF PROCEDURE:     The patient has had a prior open left inguinal hernia repair therefore by definition this is a recurrent inguinal hernia repair.    The patient was brought to operating room number 9, with consent form signed and on the chart and the site of surgery marked. After satisfactory induction of general anesthesia, the patient was kept in the supine position, bilateral arms tucked to his sides, with appropriate padding. The patient's abdomen was prepped and draped sterilely, including use of Ioban.    After appropriate time-out was held, the skin was infused with local anesthetic, an incision was made on the left abdomen and an 8 mm optical entry trocar was placed  intra-abdominally under visualization, and pneumoperitoneum was achieved. The abdomen was explored, with findings noted above. An 8 mm robotic port was placed on the right side and an 8 mm robotic port placed at the umbilicus.  An additional assistant 8 mm port was placed in the right abdomen.      The patient was placed in mild Trendelenburg position.  The patient did not tolerate further Trendelenburg.  An assistant port was used to help retract the intra-abdominal contents away from the pelvis. The robot was docked and robotic instruments were inserted.    The abdomen was explored, with findings noted as above.  The peritoneum was incised, and a peritoneal flap was created on the Left. This was carried down towards Cooper's ligament medially and the abdominal side wall laterally. Appropriate landmarks were identified.  The cord structures were dissected free of the peritoneum, and the peritoneum was taken down beyond the splaying of the cord. The large indirect hernia was plicated with a 3-0 barbed PDS suture.  Care was taken to avoid injury to the associated nerves.    Once dissection was completed, a 10 cm x 15 cm mesh was inserted and set into position. It was secured with a 3-0 PDS suture on Cooper's ligament and an additional suture to the medial anterior abdominal wall. It was noted to be lying appropriately and in good position.  Hemostasis was ensured. A 3-0 barbed absorbable suture was used to reapproximate the peritoneum.    Once completed, the ports were removed sequentially under visualization, and the pneumoperitoneum was allowed to escape.     All skin incisions were closed with subcuticular Monocryl and Dermabond.    The patient remained stable during my presence in the operating room.

## 2024-01-11 ENCOUNTER — Encounter

## 2024-01-11 NOTE — Telephone Encounter (Signed)
 Pt is requesting these be sent to [facility]. Previous Rx's were sent to [facility]    Last appointment: 07/18/23  Next appointment: none  Previous refill encounter(s): 07/07/23    Requested Prescriptions     Pending Prescriptions Disp Refills    atorvastatin  (LIPITOR) 20 MG tablet 90 tablet 1     Sig: Take 1 tablet by mouth every evening    metFORMIN  (GLUCOPHAGE ) 500 MG tablet 90 tablet 1     Sig: Take 1 tablet by mouth daily    metoprolol  succinate (TOPROL  XL) 50 MG extended release tablet 90 tablet 1     Sig: Take 1 tablet by mouth daily After a meal    valsartan -hydroCHLOROthiazide  (DIOVAN -HCT) 160-25 MG per tablet 90 tablet 1     Sig: Take 1 tablet by mouth daily    tamsulosin  (FLOMAX ) 0.4 MG capsule 90 capsule 1     Sig: Take 1 capsule by mouth daily         For Pharmacy Admin Tracking Only    Program: Medication Refill  CPA in place:    Recommendation Provided To:   Intervention Detail: New Rx: 5, reason: Patient Preference  Intervention Accepted By:   Joesph Closed?:    Time Spent (min): 5

## 2024-01-12 MED ORDER — VALSARTAN-HYDROCHLOROTHIAZIDE 160-25 MG PO TABS
160-25 | ORAL_TABLET | Freq: Every day | ORAL | 1 refills | 90.00000 days | Status: AC
Start: 2024-01-12 — End: ?

## 2024-01-12 MED ORDER — METFORMIN HCL 500 MG PO TABS
500 | ORAL_TABLET | Freq: Every day | ORAL | 1 refills | 90.00000 days | Status: DC
Start: 2024-01-12 — End: 2024-02-21

## 2024-01-12 MED ORDER — ATORVASTATIN CALCIUM 20 MG PO TABS
20 | ORAL_TABLET | Freq: Every evening | ORAL | 1 refills | 30.00000 days | Status: AC
Start: 2024-01-12 — End: ?

## 2024-01-12 MED ORDER — TAMSULOSIN HCL 0.4 MG PO CAPS
0.4 | ORAL_CAPSULE | Freq: Every day | ORAL | 1 refills | 30.00000 days | Status: AC
Start: 2024-01-12 — End: ?

## 2024-01-12 MED ORDER — METOPROLOL SUCCINATE ER 50 MG PO TB24
50 | ORAL_TABLET | Freq: Every day | ORAL | 1 refills | 30.00000 days | Status: AC
Start: 2024-01-12 — End: ?

## 2024-01-18 ENCOUNTER — Ambulatory Visit
Admit: 2024-01-18 | Discharge: 2024-01-18 | Payer: BLUE CROSS/BLUE SHIELD | Attending: Surgery | Primary: Family Medicine

## 2024-01-18 VITALS — BP 142/87 | HR 74 | Temp 98.30000°F | Resp 16 | Ht 69.0 in | Wt 273.0 lb

## 2024-01-18 DIAGNOSIS — Z09 Encounter for follow-up examination after completed treatment for conditions other than malignant neoplasm: Principal | ICD-10-CM

## 2024-01-18 NOTE — Progress Notes (Signed)
 Identified pt with two pt identifiers (name and DOB). Reviewed chart in preparation for visit and have obtained necessary documentation.    Jason Greene is a 53 y.o. male Post-Op Check (S/p ROBOT ASSISTED LAPAROSCOPIC RECURRENT LEFT INGUINAL HERNIA REPAIR WITH MESH on 01/03/24.)  .    Vitals:    01/18/24 0908   BP: (!) 142/87   BP Site: Left Upper Arm   Patient Position: Sitting   BP Cuff Size: Large Adult   Pulse: 74   Resp: 16   Temp: 98.3 F (36.8 C)   TempSrc: Oral   SpO2: 94%   Weight: 123.8 kg (273 lb)   Height: 1.753 m (5' 9)          1. Have you been to the ER, urgent care clinic since your last visit?  Hospitalized since your last visit?  no     2. Have you seen or consulted any other health care providers outside of the [facility] since your last visit?  Include any pap smears or colon screening.  no     Patient and provider made aware of elevated BP x2. Patient asymptomatic. Patient reminded to monitor BP, continue to take BP medications if prescribed, and follow up with PCP/Cardiologist.  Patient expressed understanding and agreement.

## 2024-01-18 NOTE — Progress Notes (Signed)
 Chief Complaint   Patient presents with    Post-Op Check     S/p ROBOT ASSISTED LAPAROSCOPIC RECURRENT LEFT INGUINAL HERNIA REPAIR WITH MESH on 01/03/24.       Tolerating PO  BMs: normal on occ Miralax   Coming along pretty good    Pain controlled with tylenol       Physical Exam:   Abdominal exam: Soft, non-distended, non-tender.    Wound: clean, dry, no drainage    Doing well  Continue restricted activity for a total of 4 weeks  Follow-up: prn      Rital Cavey N Markelle Asaro MD FACS

## 2024-01-29 ENCOUNTER — Encounter
Admit: 2024-01-29 | Discharge: 2024-01-29 | Payer: BLUE CROSS/BLUE SHIELD | Attending: Registered Nurse | Primary: Family Medicine

## 2024-01-29 ENCOUNTER — Encounter

## 2024-01-29 DIAGNOSIS — J069 Acute upper respiratory infection, unspecified: Principal | ICD-10-CM

## 2024-01-29 MED ORDER — SINUS RINSE BOTTLE KIT NA PACK
Freq: Four times a day (QID) | NASAL | 0 refills | 14.00000 days | Status: AC | PRN
Start: 2024-01-29 — End: ?

## 2024-01-29 MED ORDER — BENZONATATE 200 MG PO CAPS
200 | ORAL_CAPSULE | Freq: Three times a day (TID) | ORAL | 0 refills | 10.00000 days | Status: AC | PRN
Start: 2024-01-29 — End: 2024-02-08

## 2024-01-29 NOTE — Progress Notes (Signed)
 Jason Greene (DOB:  11-27-1970) is a Established patient, here for evaluation of the following:    Sore Throat (X4 days, sore throat, cough, post nasal drip, hoarse voice. Home COVID test negative Sat. )       Assessment & Plan:  Below is the assessment and plan developed based on review of pertinent history, physical exam, labs, studies, and medications.  1. Viral URI with cough  -     benzonatate  (TESSALON ) 200 MG capsule; Take 1 capsule by mouth 3 times daily as needed for Cough, Disp-30 capsule, R-0Normal  -     Hypertonic Nasal Wash (SINUS RINSE BOTTLE KIT) PACK; 1 kit by Nasal route 4 times daily as needed (congestion), Disp-1 each, R-0Normal  The patient was educated on reasons to seek urgent medical care including chest pain, shortness of breath or difficulty breathing at rest, severe pain, fever >102 not controlled by medication, and severe weakness or confusion. Patient verbalized understanding  The patient would benefit from future follow up with their usual PCP. As of the end of their Virtualist Visit today, follow up visit status is as follows: Patient to follow up as previously instructed by PCP    Return if symptoms worsen or fail to improve.    Subjective:   Cold Symptoms   This is a new problem. The current episode started in the past 7 days. The problem has been unchanged. There has been no fever. Associated symptoms include congestion, coughing, diarrhea, rhinorrhea, sinus pain and a sore throat. Pertinent negatives include no headaches, nausea, swollen glands, vomiting or wheezing. Treatments tried: dayquil for HBP, drinking tea.     Review of Systems   Constitutional:  Positive for fatigue. Negative for chills, diaphoresis and fever.   HENT:  Positive for congestion, postnasal drip, rhinorrhea, sinus pressure, sinus pain, sore throat and voice change.    Respiratory:  Positive for cough. Negative for chest tightness, shortness of breath and wheezing.    Gastrointestinal:  Positive for diarrhea.  Negative for nausea and vomiting.   Musculoskeletal:  Negative for myalgias.   Neurological:  Negative for headaches.   He has not had to use his inhaler more    Objective:  Patient-Reported Vitals  No data recorded         01/29/2024     8:12 AM   Patient-Reported Vitals   Patient-Reported Weight 272   Patient-Reported Height 5'11   Patient-Reported Systolic 131 mmHg   Patient-Reported Diastolic 91 mmHg   Patient-Reported Pulse 73   Patient-Reported Temperature 97.8        Physical Exam:  [INSTRUCTIONS:  [x]  Indicates a positive item  []  Indicates a negative item  -- DELETE ALL ITEMS NOT EXAMINED]    Constitutional: [x]  Appears well-developed and well-nourished [x]  No apparent distress      []  Abnormal -     Mental status: [x]  Alert and awake  [x]  Oriented to person/place/time [x]  Able to follow commands    []  Abnormal -     Eyes:   EOM    [x]   Normal    []  Abnormal -   Sclera  [x]   Normal    []  Abnormal -          Discharge [x]   None visible   []  Abnormal -     HENT: [x]  Normocephalic, atraumatic  []  Abnormal -   [x]  Mouth/Throat: Mucous membranes are moist    External Ears [x]  Normal  []  Abnormal -    Neck: [x]   No visualized mass []  Abnormal -     Pulmonary/Chest: [x]  Respiratory effort normal   [x]  No visualized signs of difficulty breathing or respiratory distress        []  Abnormal -      Musculoskeletal:   []  Normal gait with no signs of ataxia         [x]  Normal range of motion of neck        []  Abnormal -     Neurological:        [x]  No Facial Asymmetry (Cranial nerve 7 motor function) (limited exam due to video visit)          [x]  No gaze palsy        []  Abnormal -          Skin:        [x]  No significant exanthematous lesions or discoloration noted on facial skin         []  Abnormal -            Psychiatric:       [x]  Normal Affect []  Abnormal -        []  No Hallucinations    Other pertinent observable physical exam findings:- dry cough        Jason Greene, was evaluated through a synchronous  (real-time) audio-video encounter. The patient (or guardian if applicable) is aware that this is a billable service, which includes applicable co-pays. This Virtual Visit was conducted with patient's (and/or legal guardian's) consent. Patient identification was verified, and a caregiver was present when appropriate.   The patient was located at Home: [address]  [place] [address]  Provider was located at Home (Appt Dept State): [place]  Confirm you are appropriately licensed, registered, or certified to deliver care in the state where the patient is located as indicated above. If you are not or unsure, please re-schedule the visit: Yes, I confirm.        --LAURAINE CHRISTELLA STANFORD, APRN - CNP

## 2024-01-30 MED ORDER — HYDROCOD POLI-CHLORPHE POLI ER 10-8 MG/5ML PO SUER
10-8 | Freq: Two times a day (BID) | ORAL | 0 refills | Status: AC | PRN
Start: 2024-01-30 — End: 2024-02-08

## 2024-01-31 ENCOUNTER — Ambulatory Visit: Admit: 2024-01-31 | Discharge: 2024-01-31 | Payer: BLUE CROSS/BLUE SHIELD | Primary: Family Medicine

## 2024-01-31 VITALS — BP 116/73 | HR 84 | Temp 98.40000°F | Resp 16 | Wt 278.0 lb

## 2024-01-31 DIAGNOSIS — J069 Acute upper respiratory infection, unspecified: Principal | ICD-10-CM

## 2024-01-31 NOTE — Progress Notes (Signed)
 Patient Name: Jason Greene   Date of Birth:  1971-03-28   Patient Status: Established patient,   Chief Complaint: Cough (Pt has been coughing with a sore throat since Saturday. Monday, Pt did a video on Monday with a PA. Was given benzonatate  coughing pearls and was told to garle with warm salt water. Yesterday Pt reached out to PCP, was given a cough syrup. Nothing is making the cough go away. Pt has been slightly nauseous. Clear cough production, no fevers. Pt has taken 2 home covd tests-negative.)      ____________________________________________________________________________________________    ASSESSMENT/PLAN:    Clinical Impression:   Assessment & Plan         MDM: The patient presents with symptoms and clinic findings consistent with viral upper respiratory infection . At the time of discharge the patient clinically looked well. Patient refused chest xray at [facility]. Breath sounds are clear and normal tactile fremitus is present .  Covid is negative at home x2 tests. The patient had normal work of breathing. Normal level of alertness.  Well hydrated. No meningeal signs. Normal room air O2sat. No signs of sepsis.  Patient asked about needing an  antibiotic. Antibiotic stewardship education discussed. Patient verbalized understanding. ER precautions given to include persistent high fever, shortness of breath or labored breathing, chest pain or new / worsening symptoms. Symptoms care instructions given and discussed.  All questions answered.  Encouraged follow up with PCP for persistent or recurrent symptoms. Patient verbalized understanding and agreement with precautions and care plan.        External Records Reviewed: None    Limitation to History: None    Outside Historian: None    SUBJECTIVE/OBJECTIVE:  Jason Greene is a 53 y.o. male presents with complaint of Cough for 4 days and sore throat.   Symptoms  show no change since onset.  Patient states he saw telehealth and was given tessalon  pearls with  no relief. States his primary care provider called in Hydrocodone cough syrup yesterday, but that he did not have any relief from cough over night. The patient denies fever, shortness of breath, and chest pain or wheezing. States he took 2 covid tests at home with the last one being yesterday and they were negative.  Patient reports gargling with warm salt water with no relief. No other acute symptoms reported at this time.          PAST MEDICAL HISTORY:   Medical: Pt  has a past medical history of Asthma, Diabetes (HCC), Erectile dysfunction, GERD (gastroesophageal reflux disease), High cholesterol, Hypertension, Obesity, and OSA (obstructive sleep apnea).  Surgical: Pt  has a past surgical history that includes Cholecystectomy; hernia repair; Upper gastrointestinal endoscopy; Lumbar spine surgery (1996); Tonsillectomy; Colonoscopy; and hernia repair (N/A, 01/03/2024).  Family: Pt family history includes Cancer in his father; Hypertension in his mother; No Known Problems in his daughter, daughter, son, and son.  Social: Pt   Social History     Socioeconomic History    Marital status: Single     Spouse name: Not on file    Number of children: 4    Years of education: 12    Highest education level: 12th grade   Occupational History    Not on file   Tobacco Use    Smoking status: Former     Current packs/day: 0.00     Average packs/day: 0.5 packs/day for 15.0 years (7.5 ttl pk-yrs)     Types: Cigarettes  Start date: 10/03/1986     Quit date: 10/02/2001     Years since quitting: 22.3     Passive exposure: Past    Smokeless tobacco: Never   Vaping Use    Vaping status: Never Used   Substance and Sexual Activity    Alcohol use: Yes     Alcohol/week: 7.0 standard drinks of alcohol     Types: 7 Glasses of wine per week     Comment: every day    Drug use: Never    Sexual activity: Not Currently     Partners: Female   Other Topics Concern    Not on file   Social History Narrative    Not on file     Social Drivers of Health      Financial Resource Strain: Low Risk  (11/08/2022)    Overall Financial Resource Strain (CARDIA)     Difficulty of Paying Living Expenses: Not hard at all   Food Insecurity: Not on file (07/18/2023)   Transportation Needs: No Transportation Needs (07/18/2023)    PRAPARE - Therapist, art (Medical): No     Lack of Transportation (Non-Medical): No   Physical Activity: Insufficiently Active (10/29/2021)    Exercise Vital Sign     Days of Exercise per Week: 2 days     Minutes of Exercise per Session: 40 min   Stress: No Stress Concern Present (10/29/2021)    [employer] of Occupational Health - Occupational Stress Questionnaire     Feeling of Stress : Not at all   Social Connections: Socially Isolated (10/29/2021)    Social Connection and Isolation Panel     Frequency of Communication with Friends and Family: Three times a week     Frequency of Social Gatherings with Friends and Family: Three times a week     Attends Religious Services: Never     Active Member of Clubs or Organizations: No     Attends Banker Meetings: Never     Marital Status: Separated   Intimate Partner Violence: Not At Risk (10/29/2021)    Humiliation, Afraid, Rape, and Kick questionnaire     Fear of Current or Ex-Partner: No     Emotionally Abused: No     Physically Abused: No     Sexually Abused: No   Housing Stability: Low Risk  (07/18/2023)    Housing Stability Vital Sign     Unable to Pay for Housing in the Last Year: No     Number of Times Moved in the Last Year: 0     Homeless in the Last Year: No       Allergies: Patient  Allergies   Allergen Reactions    Lisinopril Cough         OBJECTIVE:   Blood pressure 116/73, pulse 84, temperature 98.4 F (36.9 C), temperature source Oral, resp. rate 16, weight 126.1 kg (278 lb), SpO2 97%.    All Vitals reviewed by myself    Physical Exam  Vitals and nursing note reviewed.   Constitutional:       General: He is not in acute distress.     Appearance: Normal  appearance. He is not ill-appearing, toxic-appearing or diaphoretic.   HENT:      Head: Normocephalic and atraumatic.      Right Ear: Tympanic membrane, ear canal and external ear normal.      Left Ear: Tympanic membrane, ear canal and external ear normal.  Nose: Congestion and rhinorrhea present.      Mouth/Throat:      Mouth: Mucous membranes are moist.      Pharynx: Oropharynx is clear. Posterior oropharyngeal erythema present. No oropharyngeal exudate.   Eyes:      General: No scleral icterus.        Right eye: No discharge.         Left eye: No discharge.      Conjunctiva/sclera: Conjunctivae normal.   Cardiovascular:      Rate and Rhythm: Normal rate and regular rhythm.      Pulses: Normal pulses.      Heart sounds: Normal heart sounds.   Pulmonary:      Effort: Pulmonary effort is normal. No tachypnea, accessory muscle usage, prolonged expiration, respiratory distress or retractions.      Breath sounds: Normal breath sounds and air entry. No stridor, decreased air movement or transmitted upper airway sounds. No decreased breath sounds, wheezing, rhonchi or rales.      Comments: Tactile fremitus is present and equal bilateral.   Chest:      Chest wall: No tenderness.   Musculoskeletal:      Cervical back: Normal range of motion and neck supple.   Skin:     General: Skin is warm and dry.      Capillary Refill: Capillary refill takes less than 2 seconds.   Neurological:      Mental Status: He is alert and oriented to person, place, and time.           Procedures:   Procedures      DIAGNOSTICS:     All lab values have been personally reviewed by myself:   No results found for this visit on 01/31/24.           An electronic signature was used to authenticate this note.    Asberry LITTIE Gleason, APRN - CNP

## 2024-01-31 NOTE — Patient Instructions (Signed)
 Viral upper respiratory infection does not require antibiotic as they do not treat / help viral illnesses. Viral symptoms will usually improve over the next week, but lingering cough or congestion can take several weeks to completely resolve - this is normal.   To prevent dehydration, drink plenty of fluids. Choose water and other clear liquids until you feel better.   Take an over-the-counter pain medicine, such as acetaminophen (Tylenol), ibuprofen (Advil, Motrin), or naproxen (Aleve) for fever or pain, can take Tylenol and Motrin together for more significant pain or can alternate every 4 hours for pain / fever. Be safe with medicines. Read and follow all instructions on the label. No one younger than 18 should take aspirin. It has been linked to Reye syndrome, a serious illness.  Be careful when taking over-the-counter cold or influenza (flu) medicines and Tylenol at the same time. Many of these medicines have acetaminophen, which is Tylenol. Read the labels to make sure that you are not taking more than the recommended dose. Too much acetaminophen (Tylenol) can be harmful.  Get plenty of rest.  Use saline (saltwater) nasal washes to help keep your nasal passages open and wash out mucus and allergens. You can buy saline nose sprays at a grocery store or drugstore. Follow the instructions on the package.   Use a vaporizer or humidifier to add moisture to your bedroom. Follow the instructions for cleaning the machine.   Recommend warm tea with honey, saltwater gargles and other natural remedies for symptoms.   Do not smoke or allow others to smoke around you. If you need help quitting, talk to your doctor about stop-smoking programs and medicines. These can increase your chances of quitting for good.  Return to clinic if symptoms change, fever, or mild worsening of symptoms.  Go to ER immediately if chest pain, shortness of breath, dehydration, high or persistent fevers or any new concerning symptoms.  Follow up  with PCP in 3-5 days for persistent or recurrent symptoms.

## 2024-02-02 ENCOUNTER — Emergency Department: Admit: 2024-02-02 | Payer: BLUE CROSS/BLUE SHIELD | Primary: Family Medicine

## 2024-02-02 ENCOUNTER — Inpatient Hospital Stay
Admit: 2024-02-02 | Discharge: 2024-02-02 | Disposition: A | Discharge status: Home / Self Care | Payer: BLUE CROSS/BLUE SHIELD | Arrived: VH

## 2024-02-02 DIAGNOSIS — J4521 Mild intermittent asthma with (acute) exacerbation: Principal | ICD-10-CM

## 2024-02-02 MED ORDER — IPRATROPIUM-ALBUTEROL 0.5-2.5 (3) MG/3ML IN SOLN
0.5-2.5 | RESPIRATORY_TRACT | 0 refills | 20.00000 days | Status: AC
Start: 2024-02-02 — End: ?

## 2024-02-02 MED ORDER — PROMETHAZINE-DM 6.25-15 MG/5ML PO SYRP
6.25-15 | Freq: Four times a day (QID) | ORAL | 0 refills | 5.00000 days | Status: AC | PRN
Start: 2024-02-02 — End: 2024-02-07

## 2024-02-02 MED ORDER — AZITHROMYCIN 250 MG PO TABS
250 | ORAL_TABLET | ORAL | 0 refills | 5.00000 days | Status: AC
Start: 2024-02-02 — End: 2024-02-12

## 2024-02-02 MED ORDER — IPRATROPIUM-ALBUTEROL 0.5-2.5 (3) MG/3ML IN SOLN
0.5-2.5 | RESPIRATORY_TRACT | Status: AC
Start: 2024-02-02 — End: 2024-02-02
  Administered 2024-02-02: 20:00:00 1 via RESPIRATORY_TRACT

## 2024-02-02 MED ORDER — ALBUTEROL SULFATE HFA 108 (90 BASE) MCG/ACT IN AERS
108 | Freq: Four times a day (QID) | RESPIRATORY_TRACT | 1 refills | 25.00000 days | Status: AC | PRN
Start: 2024-02-02 — End: ?

## 2024-02-02 MED ORDER — FAMOTIDINE 20 MG PO TABS
20 | ORAL_TABLET | Freq: Two times a day (BID) | ORAL | 0 refills | 30.00000 days | Status: AC
Start: 2024-02-02 — End: ?

## 2024-02-02 MED ORDER — GUAIFENESIN-CODEINE 100-10 MG/5ML PO SOLN
100-10 | Freq: Once | ORAL | Status: AC
Start: 2024-02-02 — End: 2024-02-02
  Administered 2024-02-02: 21:00:00 10 mL via ORAL

## 2024-02-02 MED ORDER — DEXAMETHASONE SOD PHOSPHATE PF 10 MG/ML IJ SOLN
10 | Freq: Once | INTRAMUSCULAR | Status: AC
Start: 2024-02-02 — End: 2024-02-02
  Administered 2024-02-02: 21:00:00 10 mg via ORAL

## 2024-02-02 MED ORDER — PREDNISONE 20 MG PO TABS
20 | ORAL_TABLET | Freq: Every day | ORAL | 0 refills | 7.00000 days | Status: AC
Start: 2024-02-02 — End: 2024-02-08

## 2024-02-02 MED FILL — DEXAMETHASONE SOD PHOSPHATE PF 10 MG/ML IJ SOLN: 10 mg/mL | INTRAMUSCULAR | Qty: 1 | Fill #0

## 2024-02-02 MED FILL — IPRATROPIUM-ALBUTEROL 0.5-2.5 (3) MG/3ML IN SOLN: 0.5-2.5 (3) MG/3ML | RESPIRATORY_TRACT | Qty: 3 | Fill #0

## 2024-02-02 MED FILL — GUAIATUSSIN AC 100-10 MG/5ML PO SYRP: 100-10 MG/5ML | ORAL | Qty: 10 | Fill #0

## 2024-02-02 NOTE — ED Provider Notes (Signed)
 [facility]  EMERGENCY DEPARTMENT ENCOUNTER       Pt Name: Jason Greene  MRN: [id number]  Birthdate 01/15/1971  Date of evaluation: 02/02/2024  Provider: Asberry Lodge, APRN - CNP   PCP: Kennie Darin MATSU, MD  Note Started:  4:06 PM EDT 02/02/24     CHIEF COMPLAINT       Chief Complaint   Patient presents with    Cold Symptoms        HISTORY OF PRESENT ILLNESS: 1 or more elements      History From: Patient  HPI Limitations: None     Jason Greene is a 53 y.o. male past medical history of asthma, hypertension, diabetes, hyperlipidemia who presents complaining of cough x 10 days.  Patient reports cough productive of greenish sputum today.  He reports taking Tessalon  Perles and prescription cough syrup with no symptom relief.  Patient also states he used his rescue albuterol  inhaler with no relief.  Patient is noted coughing during the exam.  Denies any new medications.  He denies recent travel, denies sick contacts.  Denies fever, chills, chest pain, shortness of breath, myalgia.      Nursing Notes were all reviewed and agreed with or any disagreements were addressed in the HPI.     REVIEW OF SYSTEMS      Review of Systems     Positives and Pertinent negatives as per HPI.    PAST HISTORY     Past Medical History:  Past Medical History:   Diagnosis Date    Asthma     Diabetes (HCC)     oral agents    Erectile dysfunction     GERD (gastroesophageal reflux disease)     High cholesterol     Hypertension     Dr. Lindle    Obesity     OSA (obstructive sleep apnea)     uses CPAP       Past Surgical History:  Past Surgical History:   Procedure Laterality Date    CHOLECYSTECTOMY      COLONOSCOPY      HERNIA REPAIR      HERNIA REPAIR N/A 01/03/2024    ROBOT ASSISTED LAPAROSCOPIC RECURRENT LEFT INGUINAL HERNIA REPAIR WITH MESH performed by Geni Derick SAILOR, MD at [facility] MAIN OR    LUMBAR SPINE SURGERY  1996    TONSILLECTOMY      UPPER GASTROINTESTINAL ENDOSCOPY         Family History:  Family History    Problem Relation Age of Onset    Hypertension Mother     Cancer Father     No Known Problems Son     No Known Problems Son     No Known Problems Daughter     No Known Problems Daughter        Social History:  Social History     Tobacco Use    Smoking status: Former     Current packs/day: 0.00     Average packs/day: 0.5 packs/day for 15.0 years (7.5 ttl pk-yrs)     Types: Cigarettes     Start date: 10/03/1986     Quit date: 10/02/2001     Years since quitting: 22.3     Passive exposure: Past    Smokeless tobacco: Never   Vaping Use    Vaping status: Never Used   Substance Use Topics    Alcohol use: Yes     Alcohol/week: 7.0 standard drinks of alcohol  Types: 7 Glasses of wine per week     Comment: every day    Drug use: Never       Allergies:  Allergies   Allergen Reactions    Lisinopril Cough       CURRENT MEDICATIONS      Previous Medications    ACETAMINOPHEN  (TYLENOL ) 325 MG TABLET    Take 2 tablets by mouth 4 times daily as needed for Pain    ALBUTEROL  SULFATE HFA (PROVENTIL ;VENTOLIN ;PROAIR ) 108 (90 BASE) MCG/ACT INHALER    Inhale 2 puffs into the lungs every 4 hours as needed    ASPIRIN 81 MG EC TABLET    Take by mouth daily    ATORVASTATIN  (LIPITOR) 20 MG TABLET    Take 1 tablet by mouth every evening    BENZONATATE  (TESSALON ) 200 MG CAPSULE    Take 1 capsule by mouth 3 times daily as needed for Cough    BUDESON-GLYCOPYRROL-FORMOTEROL (BREZTRI AEROSPHERE) 160-9-4.8 MCG/ACT AERO    Inhale 2 puffs into the lungs daily    FLUTICASONE  (FLONASE) 50 MCG/ACT NASAL SPRAY    by Nasal route daily as needed for Allergies    HYPERTONIC NASAL WASH (SINUS RINSE BOTTLE KIT) PACK    1 kit by Nasal route 4 times daily as needed (congestion)    METFORMIN  (GLUCOPHAGE ) 500 MG TABLET    Take 1 tablet by mouth daily    METOPROLOL  SUCCINATE (TOPROL  XL) 50 MG EXTENDED RELEASE TABLET    Take 1 tablet by mouth daily After a meal    MONTELUKAST (SINGULAIR) 10 MG TABLET    Take 1 tablet by mouth nightly    ONDANSETRON  (ZOFRAN -ODT) 4 MG  DISINTEGRATING TABLET    Take 1 tablet by mouth 3 times daily as needed for Nausea or Vomiting    PANTOPRAZOLE  (PROTONIX ) 40 MG TABLET    Take 1 tablet by mouth 2 times daily    TAMSULOSIN  (FLOMAX ) 0.4 MG CAPSULE    Take 1 capsule by mouth daily    VALSARTAN -HYDROCHLOROTHIAZIDE  (DIOVAN -HCT) 160-25 MG PER TABLET    Take 1 tablet by mouth daily       PHYSICAL EXAM      ED Triage Vitals [02/02/24 1515]   Encounter Vitals Group      BP (!) 151/101      Girls Systolic BP Percentile       Girls Diastolic BP Percentile       Boys Systolic BP Percentile       Boys Diastolic BP Percentile       Pulse 87      Respirations 20      Temp 97.6 F (36.4 C)      Temp Source Oral      SpO2 97 %      Weight - Scale 123.4 kg (272 lb)      Height 1.803 m (5' 11)      Head Circumference       Peak Flow       Pain Score       Pain Loc       Pain Education       Exclude from Growth Chart         Physical Exam  Vitals and nursing note reviewed.   Constitutional:       General: He is not in acute distress.     Appearance: Normal appearance. He is obese. He is not ill-appearing, toxic-appearing or diaphoretic.   HENT:  Nose: Nose normal. No congestion or rhinorrhea.   Cardiovascular:      Rate and Rhythm: Normal rate and regular rhythm.      Pulses: Normal pulses.      Heart sounds: Normal heart sounds.   Pulmonary:      Effort: Pulmonary effort is normal.      Breath sounds: Wheezing and rhonchi present.   Skin:     General: Skin is dry.      Capillary Refill: Capillary refill takes less than 2 seconds.   Neurological:      General: No focal deficit present.      Mental Status: He is alert and oriented to person, place, and time.          DIAGNOSTIC RESULTS   LABS:     Labs Reviewed - No data to display       EKG: When ordered, EKG's are interpreted by the Emergency Department Physician in the absence of a cardiologist.  Please see their note for interpretation of EKG.      RADIOLOGY:  Non-plain film images such as CT, Ultrasound and  MRI are read by the radiologist. Plain radiographic images are visualized and preliminarily interpreted by the ED Provider with the below findings:     Interpretation per the Radiologist below, if available at the time of this note:     XR CHEST (2 VW)  Result Date: 02/02/2024  EXAM: XR CHEST (2 VW) INDICATION:  cough COMPARISON: None TECHNIQUE: PA and lateral chest views FINDINGS: The cardiac size is within normal limits. The pulmonary vasculature is within normal limits. The lungs and pleural spaces are clear. The visualized bones and upper abdomen are age-appropriate. Cervical fusion hardware.     No acute cardiopulmonary process Electronically signed by Harden Cahill       PROCEDURES   Unless otherwise noted below, none  Procedures     CRITICAL CARE TIME   none    EMERGENCY DEPARTMENT COURSE and DIFFERENTIAL DIAGNOSIS/MDM   Vitals:    Vitals:    02/02/24 1515 02/02/24 1610   BP: (!) 151/101    Pulse: 87 94   Resp: 20 17   Temp: 97.6 F (36.4 C)    TempSrc: Oral    SpO2: 97% 94%   Weight: 123.4 kg (272 lb)    Height: 1.803 m (5' 11)         Patient was given the following medications:  Medications   guaiFENesin -codeine  (guaiFENesin  AC) 100-10 MG/5ML liquid 10 mL (10 mLs Oral Given 02/02/24 1640)   dexAMETHasone  (PF) (DECADRON ) Oral 10 mg (10 mg Oral Given 02/02/24 1640)   ipratropium 0.5 mg-albuterol  2.5 mg (DUONEB ) nebulizer solution 1 Dose (1 Dose Inhalation Given 02/02/24 1610)       CONSULTS: (Who and What was discussed)  None    Chronic Conditions:  has a past medical history of Asthma, Diabetes (HCC), Erectile dysfunction, GERD (gastroesophageal reflux disease), High cholesterol, Hypertension, Obesity, and OSA (obstructive sleep apnea).     Social Determinants affecting Dx or Tx: None    Records Reviewed (source and summary of external records): Nursing Notes    CC/HPI Summary, DDx, ED Course, and Reassessment:     ED Course as of 02/02/24 1659   Fri Feb 02, 2024   1603 XR CHEST (2 VW)  IMPRESSION:     No  acute cardiopulmonary process        Electronically signed by Harden Cahill      Exam Ended:  02/02/24 15:50 EDT     [RT]   1605 Respiratory advise DuoNeb  ordered [RT]   1652 Patient reassessed, wheezing resolved after DuoNeb .  Cough is mildly decreased.    Shared decision making patient.  Discussed treatment for bronchitis with asthma exacerbation.  Continue nebulizer treatments as ordered by your primary care provider.  Take ordered medication.  Follow-up with primary care in 2 to 3 days.  Return to ED for worsening symptoms.  Patient verbalized understanding agrees with plan of care. [RT]      ED Course User Index  [RT] Aaron Stabs, APRN - CNP     Patient presenting with cough.  Patient afebrile. No hypoxia. Lung exam within normal limits.  Symptoms consistent with asthma exacerbation with bronchitis.  Obtained and reviewed CXR, which did not reveal any acute abnormalities or infiltrates.  I also considered pneumonia, GERD, pneumothorax, PE but this appears less likely considering the data gathered thus far.  I have instructed the patient to return to the ER at any time if there are any new or worsening symptoms. Provided patient with prescription for cough suppressant.  Supportive treatment options were discussed.   The patient expressed understanding of and agreement with this plan.  Opportunity was given for questions prior to discharge and all stated questions were answered to the patient's satisfaction.    Advised Pt on supportive therapies, including using a vaporizer/humidifer/steam from hot showers, advancement of fluids as tolerated, exercise, nasal saline sprays, rest, avoidance of cold air, avoidance of second-hand smoke, frequent hand-washing w/ soap and water, and OTC acetaminophen  or ibuprofen as directed prn for pain control.    Instructed Pt to monitor for shaking chills or T>100.5 deg F, persistent cough >7-10d, hemoptysis, delirium or confusion, cyanosis, and respiratory distress.  Instructed Pt to f/up w/ PCP in 5 days.  Return to the emergency department for worsening symptoms pt verbally expressed understanding and all questions were addressed to Pt's satisfaction.     Disposition Considerations (Tests not done, Shared Decision Making, Pt Expectation of Test or Tx.): As above     FINAL IMPRESSION     1. AB (asthmatic bronchitis), mild intermittent, with acute exacerbation    2. Acute cough          DISPOSITION/PLAN   DISPOSITION Decision To Discharge 02/02/2024 04:58:03 PM   DISPOSITION CONDITION Stable       Discharge Note: The patient is stable for discharge home. The signs, symptoms, diagnosis, and discharge instructions have been discussed, understanding conveyed, and agreed upon. The patient is to follow up as recommended or return to ER should their symptoms worsen.      PATIENT REFERRED TO:  Kennie Darin MATSU, MD  [address] [place] [address]  [phone]    In 3 days  If symptoms worsen    [facility] Emergency Department  [address] [place]  [address]  [phone]    If symptoms worsen       DISCHARGE MEDICATIONS:     Medication List        START taking these medications      azithromycin  250 MG tablet  Commonly known as: ZITHROMAX   500mg  on day 1 followed by 250mg  on days 2 - 5     famotidine  20 MG tablet  Commonly known as: PEPCID   Take 1 tablet by mouth 2 times daily     ipratropium 0.5 mg-albuterol  2.5 mg 0.5-2.5 (3) MG/3ML Soln nebulizer solution  Commonly known as:  DUONEB   Inhale 3 mLs into the lungs every 4 hours     predniSONE  20 MG tablet  Commonly known as: DELTASONE   Take 2 tablets by mouth daily for 5 days  Start taking on: February 03, 2024     promethazine -dextromethorphan 6.25-15 MG/5ML syrup  Commonly known as: PROMETHAZINE -DM  Take 5 mLs by mouth 4 times daily as needed for Cough            CHANGE how you take these medications      * albuterol  sulfate HFA 108 (90 Base) MCG/ACT inhaler  Commonly known as:  PROVENTIL ;VENTOLIN ;PROAIR   What changed: Another medication with the same name was added. Make sure you understand how and when to take each.     * albuterol  sulfate HFA 108 (90 Base) MCG/ACT inhaler  Commonly known as: Ventolin  HFA  Inhale 2 puffs into the lungs 4 times daily as needed for Wheezing  What changed: You were already taking a medication with the same name, and this prescription was added. Make sure you understand how and when to take each.           * This list has 2 medication(s) that are the same as other medications prescribed for you. Read the directions carefully, and ask your doctor or other care provider to review them with you.                STOP taking these medications      HYDROcodone-chlorpheniramine 10-8 MG/5ML Suer  Commonly known as: TUSSIONEX            ASK your doctor about these medications      acetaminophen  325 MG tablet  Commonly known as: TYLENOL   Take 2 tablets by mouth 4 times daily as needed for Pain     aspirin 81 MG EC tablet     atorvastatin  20 MG tablet  Commonly known as: LIPITOR  Take 1 tablet by mouth every evening     benzonatate  200 MG capsule  Commonly known as: TESSALON   Take 1 capsule by mouth 3 times daily as needed for Cough     Breztri Aerosphere 160-9-4.8 MCG/ACT Aero  Generic drug: Budeson-Glycopyrrol-Formoterol     fluticasone  50 MCG/ACT nasal spray  Commonly known as: FLONASE     metFORMIN  500 MG tablet  Commonly known as: GLUCOPHAGE   Take 1 tablet by mouth daily     metoprolol  succinate 50 MG extended release tablet  Commonly known as: TOPROL  XL  Take 1 tablet by mouth daily After a meal     montelukast 10 MG tablet  Commonly known as: SINGULAIR     ondansetron  4 MG disintegrating tablet  Commonly known as: ZOFRAN -ODT  Take 1 tablet by mouth 3 times daily as needed for Nausea or Vomiting     pantoprazole  40 MG tablet  Commonly known as: PROTONIX      Sinus Rinse Bottle Kit Pack  1 kit by Nasal route 4 times daily as needed (congestion)     tamsulosin  0.4 MG  capsule  Commonly known as: FLOMAX   Take 1 capsule by mouth daily     valsartan -hydroCHLOROthiazide  160-25 MG per tablet  Commonly known as: DIOVAN -HCT  Take 1 tablet by mouth daily               Where to Get Your Medications        These medications were sent to [id number] [place], [place] - [phone] -  [place] [phone]  [address], [place] [address]      Phone: [phone]   albuterol  sulfate HFA 108 (90 Base) MCG/ACT inhaler  azithromycin  250 MG tablet  famotidine  20 MG tablet  ipratropium 0.5 mg-albuterol  2.5 mg 0.5-2.5 (3) MG/3ML Soln nebulizer solution  predniSONE  20 MG tablet  promethazine -dextromethorphan 6.25-15 MG/5ML syrup           DISCONTINUED MEDICATIONS:  Current Discharge Medication List        STOP taking these medications       HYDROcodone-chlorpheniramine (TUSSIONEX) 10-8 MG/5ML SUER Comments:   Reason for Stopping:               I have seen and evaluated the patient autonomously. My supervision physician was on site and available for consultation if needed.     I am the Primary Clinician of Record.   Asberry Lodge, APRN - CNP (electronically signed)    (Please note that parts of this dictation were completed with voice recognition software. Quite often unanticipated grammatical, syntax, homophones, and other interpretive errors are inadvertently transcribed by the computer software. Please disregards these errors. Please excuse any errors that have escaped final proofreading.)        Lodge Asberry, APRN - CNP  02/02/24 1659

## 2024-02-02 NOTE — ED Notes (Signed)
 Discharge instructions were given to the patient by Elsie, RN.  The patient left the Emergency Department alert and oriented and in no acute distress with 6 prescription(s). The patient was encouraged to call or return to the ED for worsening issues or problems and was encouraged to schedule a follow up appointment for continuing care.   The patient verbalized understanding of discharge instructions and prescriptions; all questions were answered. The patient has no further concerns at this time.

## 2024-02-02 NOTE — ED Triage Notes (Signed)
 Pt reports cough x 7 days. Virtual visit was given tessalon  & PCP prescribed cough syrup with no relief.

## 2024-02-02 NOTE — Discharge Instructions (Addendum)
 May try supportive therapies, including using a vaporizer/humidifer/steam from hot showers, advancement of fluids as tolerated, exercise, nasal saline sprays, rest, avoidance of cold air, avoidance of second-hand smoke, frequent hand-washing w/ soap and water, and OTC acetaminophen  or ibuprofen as directed prn for pain control.    Monitor for shaking chills or T>100.5 deg F, persistent cough >7-10d, hemoptysis, delirium or confusion, cyanosis, and respiratory distress.     Follow up w/ PCP in 5 days.           Thank you for choosing our Emergency Department for your care.  It is our privilege to care for you in your time of need.  In the next several days, you may receive a survey via email or mailed to your home about your experience with our team.  We would greatly appreciate you taking a few minutes to complete the survey, as we use this information to learn what we have done well and what we could be doing better. Thank you for trusting us  with your care!    Below you will find a list of your tests from today's visit.   Labs and Radiology Studies  No results found for this or any previous visit (from the past 12 hours).  XR CHEST (2 VW)  Result Date: 02/02/2024  EXAM: XR CHEST (2 VW) INDICATION:  cough COMPARISON: None TECHNIQUE: PA and lateral chest views FINDINGS: The cardiac size is within normal limits. The pulmonary vasculature is within normal limits. The lungs and pleural spaces are clear. The visualized bones and upper abdomen are age-appropriate. Cervical fusion hardware.     No acute cardiopulmonary process Electronically signed by Harden Cahill    ------------------------------------------------------------------------------------------------------------  The evaluation and treatment you received in the Emergency Department were for an urgent problem. It is important that you follow-up with a doctor, nurse practitioner, or physician assistant to:  (1) confirm your diagnosis,  (2) re-evaluation of  changes in your illness and treatment, and (3) for ongoing care. Please take your discharge instructions with you when you go to your follow-up appointment.     If you have any problem arranging a follow-up appointment, contact us !  If your symptoms become worse or you do not improve as expected, please return to us . We are available 24 hours a day.     If a prescription has been provided, please fill it as soon as possible to prevent a delay in treatment. If you have any questions or reservations about taking the medication due to side effects or interactions with other medications, please call your primary care provider or contact us  directly.  Again, THANK YOU for choosing us  to care for YOU!

## 2024-02-02 NOTE — ED Notes (Addendum)
 Pt presents to ED complaining of productive green, congested cough x7 days, headache, and N/D without vomiting.    Pt endorses hx of asthma and HTN, usually controlled with medication, but pt has been experiencing intense SOB due to the cough    Pt is alert and oriented x 4, RR even and unlabored, skin is warm and dry. Pt appears in NAD at this time. Assessment completed and pt updated on plan of care.  Call bell in reach.  Emergency Department Nursing Plan of Care  The Nursing Plan of Care is developed from the Nursing assessment and Emergency Department Attending provider initial evaluation.  The plan of care may be reviewed in the "ED Provider note".  The Plan of Care was developed with the following considerations:  Patient / Family readiness to learn indicated ab:Mzqzm to Medical chart in Epic  Persons(s) to be included in education: Refer to Medical chart in Epic  Barriers to Learning/Limitations:Normal

## 2024-02-08 NOTE — Care Coordination-Inpatient (Signed)
 Ambulatory Care Coordination Note     02/08/2024 11:48 AM     Patient Current Location:  [place]      This patient was received as a referral from Population health report .    ACM contacted the patient by telephone. Verified name and DOB with patient as identifiers. Provided introduction to self, and explanation of the ACM role.   Patient accepted care management services at this time.          ACM: Tilton CHRISTELLA Bailey, RN     Challenges to be reviewed by the provider   Additional needs identified to be addressed with provider Yes  Would like earlier appointment. Seen  in Ed on 02/02/24 for asthma and cold symptoms. Had VV, was given cough syrup with no relief. Scheduled for 04/03/24               Method of communication with provider: chart routing.    Utilization: Initial Call - N/A    Care Summary Note: Initial call to patient for recent ED visit for asthma. BP was elevated on admission to 151/101. Had an acute cough. Says it is getting some better. Patient has history of CHF, asthma, and HTN.   Will monitor BP, fluid retention and cough over the next 90 days with ACM.  Message to office for first available  appointment for Ed follow up. Review of goals for CHF, Diabetes and HTN. Patient verbalize understanding and will plan to work toward goals over the next 90 days.  ACM contact and Walk in Directory sent by My Chart.      Offered patient enrollment in the Remote Patient Monitoring (RPM) program for in-home monitoring: Yes, but did not enroll at this time: already monitoring with home equipment.     Assessments Completed:       02/08/2024    11:34 AM   Amb Fall Risk Assessment and TUG Test   Do you feel unsteady or are you worried about falling?  no   2 or more falls in past year? no   Fall with injury in past year? no    ,   Ambulatory Care Coordination Assessment    Care Coordination Protocol  Referral from Primary Care Provider: No  Week 1 - Initial Assessment                             Suggested Interventions  and Community Resources   Disease Specific Clinic: In Process   Disease Assocation: In Process   Specialty Service Referral: In Process   Other Services: In Process   Zone Management Tools: In Process         Other Interventions: Set up/Review Goals, Set up/Review an Education Plan, Schedule an appointment with the patient's PCP           ,   Care Coordination Interventions    Referral from Primary Care Provider: No  Suggested Interventions and Community Resources  Disease Specific Clinic: In Process  Disease Association: In Process  Specialty Services Referral: In Process  Other Services: In Process  Zone Management Tools: In Process      ,   Congestive Heart Failure Assessment    Are you currently restricting fluids?: Other  Do you understand a low sodium diet?: Yes  Do you understand how to read food labels?: Yes  How many restaurant meals do you eat per week?: 1-2  Do you salt your food before tasting  it?: No     No patient-reported symptoms      Symptoms:     Symptom course: stable  Weight trend: stable  Salt intake watch compared to last visit: stable      ,   Hypertension - Encounter Level    Symptom course: no change      ,   Asthma - Patient Level    Asthma severity: intermittent  Patient intubated, or admitted to ICU as the result of asthma in the past five years: No     ,   General Assessment    Do you have any symptoms that are causing concern?: No      ,   Social Drivers of Health     Tobacco Use: Medium Risk (02/02/2024)    Patient History     Smoking Tobacco Use: Former     Smokeless Tobacco Use: Never     Passive Exposure: Past   Alcohol Use: Not At Risk (10/29/2021)    AUDIT-C     Frequency of Alcohol Consumption: 2-3 times a week     Average Number of Drinks: 1 or 2     Frequency of Binge Drinking: Never   Financial Resource Strain: Low Risk  (11/08/2022)    Overall Financial Resource Strain (CARDIA)     Difficulty of Paying Living Expenses: Not hard at all   Food Insecurity: Not on file (02/08/2024)    Transportation Needs: No Transportation Needs (07/18/2023)    PRAPARE - Transportation     Lack of Transportation (Medical): No     Lack of Transportation (Non-Medical): No   Physical Activity: Inactive (02/08/2024)    Exercise Vital Sign     Days of Exercise per Week: 0 days     Minutes of Exercise per Session: 0 min   Stress: No Stress Concern Present (10/29/2021)    [employer] of Occupational Health - Occupational Stress Questionnaire     Feeling of Stress : Not at all   Social Connections: Socially Isolated (10/29/2021)    Social Connection and Isolation Panel     Frequency of Communication with Friends and Family: Three times a week     Frequency of Social Gatherings with Friends and Family: Three times a week     Attends Religious Services: Never     Active Member of Clubs or Organizations: No     Attends Banker Meetings: Never     Marital Status: Separated   Intimate Partner Violence: Not At Risk (10/29/2021)    Humiliation, Afraid, Rape, and Kick questionnaire     Fear of Current or Ex-Partner: No     Emotionally Abused: No     Physically Abused: No     Sexually Abused: No   Depression: Not at risk (02/08/2024)    PHQ-2     PHQ-2 Score: 1   Housing Stability: Low Risk  (07/18/2023)    Housing Stability Vital Sign     Unable to Pay for Housing in the Last Year: No     Number of Times Moved in the Last Year: 0     Homeless in the Last Year: No   Interpersonal Safety: Not At Risk (01/03/2024)    Interpersonal Safety Domain Source: IP Abuse Screening     Physical abuse: Denies     Verbal abuse: Denies     Emotional abuse: Denies     Financial abuse: Denies     Sexual abuse: Denies   Utilities: Not At  Risk (07/18/2023)    [employer] Utilities     Threatened with loss of utilities: No        Medications Reviewed:   Completed during this call    Advance Care Planning:   Not reviewed during this call     Care Planning:   Education Documentation  Educate information regarding smoking cessation, taught by  Arnaldo Tilton HERO, RN at 02/08/2024 11:39 AM.  Learner: Patient  Readiness: Acceptance  Method: Explanation  Response: Verbalizes Understanding    Educate tobacco-use cessation benefits, taught by Arnaldo Tilton HERO, RN at 02/08/2024 11:39 AM.  Learner: Patient  Readiness: Acceptance  Method: Explanation  Response: Verbalizes Understanding    Educate non-pharmacologic treatment for tobacco-use cessation, taught by Arnaldo Tilton HERO, RN at 02/08/2024 11:39 AM.  Learner: Patient  Readiness: Acceptance  Method: Explanation  Response: Verbalizes Understanding    Educate signs and symptoms of nicotine withdrawal, taught by Arnaldo Tilton HERO, RN at 02/08/2024 11:39 AM.  Learner: Patient  Readiness: Acceptance  Method: Explanation  Response: Verbalizes Understanding    Educate coping techniques for nicotine withdrawal, taught by Arnaldo Tilton HERO, RN at 02/08/2024 11:39 AM.  Learner: Patient  Readiness: Acceptance  Method: Explanation  Response: Verbalizes Understanding    Educate relaxation techniques, taught by Arnaldo Tilton HERO, RN at 02/08/2024 11:39 AM.  Learner: Patient  Readiness: Acceptance  Method: Explanation  Response: Verbalizes Understanding    Educate effective coping behavior, taught by Arnaldo Tilton HERO, RN at 02/08/2024 11:39 AM.  Learner: Patient  Readiness: Acceptance  Method: Explanation  Response: Verbalizes Understanding    COGNITIVE : ADULT-ASTHMA, taught by Arnaldo Tilton HERO, RN at 02/08/2024 11:39 AM.  Learner: Patient  Readiness: Acceptance  Method: Explanation  Response: Verbalizes Understanding    Educate incentive spirometry usage, taught by Arnaldo Tilton HERO, RN at 02/08/2024 11:39 AM.  Learner: Patient  Readiness: Acceptance  Method: Explanation  Response: Verbalizes Understanding    Educate airway suctioning, taught by Arnaldo Tilton HERO, RN at 02/08/2024 11:39 AM.  Learner: Patient  Readiness: Acceptance  Method: Explanation  Response: Verbalizes Understanding    Educate optimal breathing  technique, taught by Arnaldo Tilton HERO, RN at 02/08/2024 11:39 AM.  Learner: Patient  Readiness: Acceptance  Method: Explanation  Response: Verbalizes Understanding    Educate coughing and deep breathing, taught by Arnaldo Tilton HERO, RN at 02/08/2024 11:39 AM.  Learner: Patient  Readiness: Acceptance  Method: Explanation  Response: Verbalizes Understanding    Educate airway irritants, taught by Arnaldo Tilton HERO, RN at 02/08/2024 11:39 AM.  Learner: Patient  Readiness: Acceptance  Method: Explanation  Response: Verbalizes Understanding    Educate activity level, taught by Arnaldo Tilton HERO, RN at 02/08/2024 11:39 AM.  Learner: Patient  Readiness: Acceptance  Method: Explanation  Response: Verbalizes Understanding    Educate information regarding physical activity cessation, taught by Arnaldo Tilton HERO, RN at 02/08/2024 11:39 AM.  Learner: Patient  Readiness: Acceptance  Method: Explanation  Response: Verbalizes Understanding    Educate energy conservation techniques, taught by Arnaldo Tilton HERO, RN at 02/08/2024 11:39 AM.  Learner: Patient  Readiness: Acceptance  Method: Explanation  Response: Verbalizes Understanding    Promote exercise regimen, taught by Arnaldo Tilton HERO, RN at 02/08/2024 11:39 AM.  Learner: Patient  Readiness: Acceptance  Method: Explanation  Response: Verbalizes Understanding    Educate reporting changes in condition, taught by Arnaldo Tilton HERO, RN at 02/08/2024 11:39 AM.  Learner: Patient  Readiness: Acceptance  Method: Explanation  Response: [place]  Understanding    COGNITIVE : ADULT-ASTHMA, taught by Arnaldo Tilton HERO, RN at 02/08/2024 11:39 AM.  Learner: Patient  Readiness: Acceptance  Method: Explanation  Response: Verbalizes Understanding    Educate follow-up care on discharge, taught by Arnaldo Tilton HERO, RN at 02/08/2024 11:39 AM.  Learner: Patient  Readiness: Acceptance  Method: Explanation  Response: Verbalizes Understanding    Educate peak expiratory flow rate, taught by Arnaldo Tilton HERO, RN at 02/08/2024 11:39 AM.  Learner: Patient  Readiness: Acceptance  Method: Explanation  Response: Verbalizes Understanding    Educate patient transfer destination, taught by Arnaldo Tilton HERO, RN at 02/08/2024 11:39 AM.  Learner: Patient  Readiness: Acceptance  Method: Explanation  Response: Verbalizes Understanding    Educate medical alert identification, taught by Arnaldo Tilton HERO, RN at 02/08/2024 11:39 AM.  Learner: Patient  Readiness: Acceptance  Method: Explanation  Response: Verbalizes Understanding    Educate recommended lifestyle changes, taught by Arnaldo Tilton HERO, RN at 02/08/2024 11:39 AM.  Learner: Patient  Readiness: Acceptance  Method: Explanation  Response: Verbalizes Understanding    Educate infection prevention measures, taught by Arnaldo Tilton HERO, RN at 02/08/2024 11:39 AM.  Learner: Patient  Readiness: Acceptance  Method: Explanation  Response: Verbalizes Understanding    Educate self-management skills, taught by Arnaldo Tilton HERO, RN at 02/08/2024 11:39 AM.  Learner: Patient  Readiness: Acceptance  Method: Explanation  Response: Verbalizes Understanding    Educate signs and symptoms of asthma, taught by Arnaldo Tilton HERO, RN at 02/08/2024 11:39 AM.  Learner: Patient  Readiness: Acceptance  Method: Explanation  Response: Verbalizes Understanding    COGNITIVE : ADULT-ASTHMA, taught by Arnaldo Tilton HERO, RN at 02/08/2024 11:39 AM.  Learner: Patient  Readiness: Acceptance  Method: Explanation  Response: Verbalizes Understanding    Educate information regarding allergy testing methods, taught by Arnaldo Tilton HERO, RN at 02/08/2024 11:39 AM.  Learner: Patient  Readiness: Acceptance  Method: Explanation  Response: Verbalizes Understanding    Educate limitations or barriers to activity and/or exercise on discharge, taught by Arnaldo Tilton HERO, RN at 02/08/2024 11:39 AM.  Learner: Patient  Readiness: Acceptance  Method: Explanation  Response: Verbalizes Understanding    Teach reporting  changes in condition, taught by Arnaldo Tilton HERO, RN at 02/08/2024 11:39 AM.  Learner: Patient  Readiness: Acceptance  Method: Explanation  Response: Verbalizes Understanding    Discuss alcohol intake, taught by Arnaldo Tilton HERO, RN at 02/08/2024 11:39 AM.  Learner: Patient  Readiness: Acceptance  Method: Explanation  Response: Verbalizes Understanding    Discuss information regarding benefits of regular exercise, taught by Arnaldo Tilton HERO, RN at 02/08/2024 11:39 AM.  Learner: Patient  Readiness: Acceptance  Method: Explanation  Response: Verbalizes Understanding    Discuss information about weight loss, taught by Arnaldo Tilton HERO, RN at 02/08/2024 11:39 AM.  Learner: Patient  Readiness: Acceptance  Method: Explanation  Response: Verbalizes Understanding    Teach appropriate dietary choices, taught by Arnaldo Tilton HERO, RN at 02/08/2024 11:39 AM.  Learner: Patient  Readiness: Acceptance  Method: Explanation  Response: Verbalizes Understanding    Discuss smoking cessation, taught by Arnaldo Tilton HERO, RN at 02/08/2024 11:39 AM.  Learner: Patient  Readiness: Acceptance  Method: Explanation  Response: Verbalizes Understanding    Discuss recommended lifestyle changes, taught by Arnaldo Tilton HERO, RN at 02/08/2024 11:39 AM.  Learner: Patient  Readiness: Acceptance  Method: Explanation  Response: Verbalizes Understanding    Teach compliance with prescribed medication regimen, taught by Arnaldo Tilton HERO, RN  at 02/08/2024 11:39 AM.  Learner: Patient  Readiness: Acceptance  Method: Explanation  Response: Verbalizes Understanding    Discuss information regarding technique to measure blood pressure, taught by Arnaldo Tilton HERO, RN at 02/08/2024 11:39 AM.  Learner: Patient  Readiness: Acceptance  Method: Explanation  Response: Verbalizes Understanding    Teach information regarding medications, taught by Arnaldo Tilton HERO, RN at 02/08/2024 11:39 AM.  Learner: Patient  Readiness: Acceptance  Method: Explanation  Response:  Verbalizes Understanding    Teach importance of blood pressure control, taught by Arnaldo Tilton HERO, RN at 02/08/2024 11:39 AM.  Learner: Patient  Readiness: Acceptance  Method: Explanation  Response: Verbalizes Understanding    Education Comments  No comments found.     ,    Goals Addressed                   This Visit's Progress     Conditions and Symptoms        I will schedule office visits, as directed by my provider.  I will keep my appointment or reschedule if I have to cancel.  I will notify my provider of any barriers to my plan of care.  I will follow my Zone Management tool to seek urgent or emergent care.  I will notify my provider of any symptoms that indicate a worsening of my condition.    Barriers: none  Plan for overcoming my barriers: N/A  Confidence: 9/10  Anticipated Goal Completion Date: 05/09/24                 PCP/Specialist follow up:   Future Appointments         Provider Specialty Dept Phone    04/03/2024 8:40 AM Kennie Darin MATSU, MD Family Medicine [phone]            Follow Up:   Plan for next ACM outreach in approximately 1 week to complete:  - CC Protocol assessments  - disease specific assessments  - SDOH assessments  - goal progression  - education .   Patient  is agreeable to this plan.

## 2024-02-09 MED ORDER — ATORVASTATIN CALCIUM 20 MG PO TABS
20 | ORAL_TABLET | Freq: Every evening | ORAL | 3 refills | 90.00000 days | Status: AC
Start: 2024-02-09 — End: ?

## 2024-02-09 NOTE — Telephone Encounter (Signed)
 [facility] [phone] is requesting refills.    Last appointment: 07/18/23  Next appointment: 04/03/24  Previous refill encounter(s): 01/12/24    Requested Prescriptions     Pending Prescriptions Disp Refills    atorvastatin  (LIPITOR) 20 MG tablet [Pharmacy Med Name: ATORVASTATIN  20 MG TABLET] 90 tablet 3     Sig: TAKE 1 TABLET BY MOUTH EVERY DAY IN THE EVENING         For Pharmacy Admin Tracking Only    Program: Medication Refill  CPA in place:    Recommendation Provided To:   Intervention Detail: New Rx: 1, reason: Patient Preference  Intervention Accepted By:   Joesph Closed?:    Time Spent (min): 5

## 2024-02-12 NOTE — Care Coordination-Inpatient (Signed)
 Ambulatory Care Coordination Note     02/12/2024 8:09 AM     Patient outreach attempt by this ACM today to perform care management follow up . ACM was unable to reach the patient by telephone today;   left voice message requesting a return phone call to this ACM.  mychart message sent requesting patient to contact this ACM.     ACM: Tilton CHRISTELLA Bailey, RN     Care Summary Note: Voice message left on phone letting patient know he has an appointment scheduled on 02/14/24 with PCP at 10:00 AM. My Chart message also, sent.    PCP/Specialist follow up:   Future Appointments         Provider Specialty Dept Phone    02/14/2024 10:00 AM Kennie Darin MATSU, MD Family Medicine [phone]    04/03/2024 8:40 AM Kennie Darin MATSU, MD Family Medicine [phone]            Follow Up:   Plan for next ACM outreach in approximately 2 weeks to complete:  - CC Protocol assessments  - disease specific assessments  - goal progression  - education .

## 2024-02-14 ENCOUNTER — Ambulatory Visit: Payer: BLUE CROSS/BLUE SHIELD | Attending: Family Medicine | Primary: Family Medicine

## 2024-02-20 ENCOUNTER — Ambulatory Visit
Admit: 2024-02-20 | Discharge: 2024-02-20 | Payer: BLUE CROSS/BLUE SHIELD | Attending: Family Medicine | Primary: Family Medicine

## 2024-02-20 ENCOUNTER — Encounter

## 2024-02-20 NOTE — Assessment & Plan Note (Signed)
 Monitored by specialist- no acute findings meriting change in the plan

## 2024-02-20 NOTE — Assessment & Plan Note (Signed)
 -  Discussed lifestyle changes; the importance of eating habits and physical activity (5 days weekly for 30 mins/day minimally or as tolerated).  -Also discussed the importance to avoid smoking and excessive alcohol consumption.  - Encouraged to eat foods that are baked, broiled, boiled, steamed or grilled.  Avoid greasy or fried foods.  Use olive oil or canola oil instead of vegetable oil.  Eat fish twice weekly.  -Have at an average of 7 hours of uninterrupted sleep at night.

## 2024-02-20 NOTE — Progress Notes (Signed)
 Chief Complaint   Patient presents with    ED Follow-up     Yes  02/02/2024 (1 hours)  [facility] Emergency Department  AB (asthmatic bronchitis), mild intermittent, with acute exacerbation           Has a F/U with Paleontologist 03/10/2024 Dr. joshua    Have you been to the ER, urgent care clinic since your last visit?  Hospitalized since your last visit?        Yes  02/02/2024 (1 hours)  [facility] Emergency Department  AB (asthmatic bronchitis), mild intermittent, with acute exacerbation        "Have you seen or consulted any other health care providers outside of [facility] since your last visit?"    NO           Vitals:    02/20/24 1056   BP: 126/72   Pulse: 81   Resp: 17   Temp: 97.6 F (36.4 C)   TempSrc: Temporal   SpO2: 98%   Weight: 123 kg (271 lb 2.7 oz)   Height: 1.727 m (5' 8)      Health Maintenance Due   Topic Date Due    HIV screen  Never done    Diabetic retinal exam  Never done    Hepatitis C screen  Never done    Hepatitis B vaccine (1 of 3 - 19+ 3-dose series) Never done    Pneumococcal 50+ years Vaccine (1 of 2 - PCV) Never done    DTaP/Tdap/Td vaccine (1 - Tdap) 01/30/2018    Diabetic foot exam  02/05/2023    A1C test (Diabetic or Prediabetic)  07/20/2023    Diabetic Alb to Cr ratio (uACR) test  07/20/2023    Lipids  07/20/2023    GFR test (Diabetes, CKD 3-4, OR last GFR 15-59)  07/20/2023    Flu vaccine (1) 01/19/2024      The patient, Jason Greene, identity was verified by name and DOB, pharmacy verified  Labs:Yes  Fasting:Yes

## 2024-02-21 ENCOUNTER — Encounter

## 2024-02-21 LAB — COMPREHENSIVE METABOLIC PANEL
ALT: 19 IU/L (ref 0–44)
AST: 15 IU/L (ref 0–40)
Albumin: 4.4 g/dL (ref 3.8–4.9)
Alkaline Phosphatase: 78 IU/L (ref 44–121)
BUN/Creatinine Ratio: 14 (ref 9–20)
BUN: 10 mg/dL (ref 6–24)
CO2: 21 mmol/L (ref 20–29)
Calcium: 9.6 mg/dL (ref 8.7–10.2)
Chloride: 99 mmol/L (ref 96–106)
Creatinine: 0.72 mg/dL — ABNORMAL LOW (ref 0.76–1.27)
Est, Glom Filt Rate: 108 mL/min/1.73 (ref >59)
Globulin, Total: 2.5 g/dL (ref 1.5–4.5)
Glucose: 125 mg/dL — ABNORMAL HIGH (ref 70–99)
Potassium: 4 mmol/L (ref 3.5–5.2)
Sodium: 140 mmol/L (ref 134–144)
Total Bilirubin: 0.7 mg/dL (ref 0.0–1.2)
Total Protein: 6.9 g/dL (ref 6.0–8.5)

## 2024-02-21 LAB — HEMOGLOBIN A1C
Estimated Avg Glucose: 154 mg/dL
Hemoglobin A1C: 7 % — ABNORMAL HIGH (ref 4.8–5.6)

## 2024-02-21 LAB — LIPID PANEL
Cholesterol, Total: 156 mg/dL (ref 100–199)
HDL: 63 mg/dL (ref >39)
LDL Cholesterol: 77 mg/dL (ref 0–99)
Triglycerides: 87 mg/dL (ref 0–149)
VLDL Cholesterol Calculated: 16 mg/dL (ref 5–40)

## 2024-02-21 MED ORDER — METFORMIN HCL 500 MG PO TABS
500 | ORAL_TABLET | Freq: Two times a day (BID) | ORAL | 0 refills | 90.00000 days | Status: DC
Start: 2024-02-21 — End: 2024-05-02

## 2024-02-22 LAB — ALBUMIN/CREATININE RATIO, URINE
Albumin Urine: 10 ug/mL
Albumin/Creatinine Ratio: 11 mg/g{creat} (ref 0–29)
Creatinine, Ur: 87.7 mg/dL

## 2024-02-22 LAB — DIABETES PATIENT EDUCATION

## 2024-02-23 NOTE — Care Coordination-Inpatient (Signed)
 Ambulatory Care Coordination Note     02/23/2024 3:59 PM     Patient Current Location:  [place]      ACM contacted the patient by telephone. Verified name and DOB with patient as identifiers.         ACM: Tilton CHRISTELLA Bailey, RN     Challenges to be reviewed by the provider   Additional needs identified to be addressed with provider No                 Method of communication with provider: none.    Utilization: Has the patient been seen in the ED since your last call? no    Care Summary Note: Admits to feeling better. Taking all medications as ordered. Completed follow up on 02/20/24 as was scheduled for chronic cough. Says this is a chronic problem, which he has followed up with GI/ENT and pulm (Dr. Joshua). Unclear etiology per patient thus far. States that he has had EGD, nasopharyngoscopy and PFTs. Watching his fluid intake. Compliant with a low sodium and low carb diet.    Offered patient enrollment in the Remote Patient Monitoring (RPM) program for in-home monitoring: Yes, but did not enroll at this time: already monitoring with home equipment.     Assessments Completed:   Ambulatory Care Coordination Assessment    Care Coordination Protocol  Referral from Primary Care Provider: No  Week 1 - Initial Assessment                             Suggested Interventions and Community Resources   Disease Specific Clinic: In Process   Disease Assocation: In Process   Specialty Service Referral: In Process   Other Services: In Process   Zone Management Tools: In Process         Other Interventions: Set up/Review Goals, Set up/Review an Education Plan, Schedule an appointment with the patient's PCP           ,   Care Coordination Interventions    Referral from Primary Care Provider: No  Suggested Interventions and Community Resources  Disease Specific Clinic: In Process  Disease Association: In Process  Specialty Services Referral: In Process  Other Services: In Process  Zone Management Tools: In Process      ,   Diabetes  Assessment                ,   Congestive Heart Failure Assessment    Are you currently restricting fluids?: Other  Do you understand a low sodium diet?: Yes  Do you understand how to read food labels?: Yes  How many restaurant meals do you eat per week?: 1-2  Do you salt your food before tasting it?: No     No patient-reported symptoms      Symptoms:     Symptom course: stable  Weight trend: stable  Salt intake watch compared to last visit: stable      ,   Hypertension - Encounter Level    Symptom course: improving      ,   Asthma - Patient Level    Asthma severity: intermittent  Patient intubated, or admitted to ICU as the result of asthma in the past five years: No     ,   General Assessment              Medications Reviewed:   Completed during a previous call     Advance Care Planning:  Not reviewed during this call     Care Planning:   Education Documentation  Educate information regarding smoking cessation, taught by Arnaldo Tilton HERO, RN at 02/23/2024  3:45 PM.  Learner: Patient  Readiness: Acceptance  Method: Explanation  Response: Verbalizes Understanding    Educate tobacco-use cessation benefits, taught by Arnaldo Tilton HERO, RN at 02/23/2024  3:45 PM.  Learner: Patient  Readiness: Acceptance  Method: Explanation  Response: Verbalizes Understanding    Educate non-pharmacologic treatment for tobacco-use cessation, taught by Arnaldo Tilton HERO, RN at 02/23/2024  3:45 PM.  Learner: Patient  Readiness: Acceptance  Method: Explanation  Response: Verbalizes Understanding    Educate signs and symptoms of nicotine withdrawal, taught by Arnaldo Tilton HERO, RN at 02/23/2024  3:45 PM.  Learner: Patient  Readiness: Acceptance  Method: Explanation  Response: Verbalizes Understanding    Educate coping techniques for nicotine withdrawal, taught by Arnaldo Tilton HERO, RN at 02/23/2024  3:45 PM.  Learner: Patient  Readiness: Acceptance  Method: Explanation  Response: Verbalizes Understanding    Educate relaxation techniques, taught  by Arnaldo Tilton HERO, RN at 02/23/2024  3:45 PM.  Learner: Patient  Readiness: Acceptance  Method: Explanation  Response: Verbalizes Understanding    Educate effective coping behavior, taught by Arnaldo Tilton HERO, RN at 02/23/2024  3:45 PM.  Learner: Patient  Readiness: Acceptance  Method: Explanation  Response: Verbalizes Understanding    COGNITIVE : ADULT-ASTHMA, taught by Arnaldo Tilton HERO, RN at 02/23/2024  3:45 PM.  Learner: Patient  Readiness: Acceptance  Method: Explanation  Response: Verbalizes Understanding    Educate incentive spirometry usage, taught by Arnaldo Tilton HERO, RN at 02/23/2024  3:45 PM.  Learner: Patient  Readiness: Acceptance  Method: Explanation  Response: Verbalizes Understanding    Educate airway suctioning, taught by Arnaldo Tilton HERO, RN at 02/23/2024  3:45 PM.  Learner: Patient  Readiness: Acceptance  Method: Explanation  Response: Verbalizes Understanding    Educate optimal breathing technique, taught by Arnaldo Tilton HERO, RN at 02/23/2024  3:45 PM.  Learner: Patient  Readiness: Acceptance  Method: Explanation  Response: Verbalizes Understanding    Educate coughing and deep breathing, taught by Arnaldo Tilton HERO, RN at 02/23/2024  3:45 PM.  Learner: Patient  Readiness: Acceptance  Method: Explanation  Response: Verbalizes Understanding    Educate airway irritants, taught by Arnaldo Tilton HERO, RN at 02/23/2024  3:45 PM.  Learner: Patient  Readiness: Acceptance  Method: Explanation  Response: Verbalizes Understanding    Educate activity level, taught by Arnaldo Tilton HERO, RN at 02/23/2024  3:45 PM.  Learner: Patient  Readiness: Acceptance  Method: Explanation  Response: Verbalizes Understanding    Educate information regarding physical activity cessation, taught by Arnaldo Tilton HERO, RN at 02/23/2024  3:45 PM.  Learner: Patient  Readiness: Acceptance  Method: Explanation  Response: Verbalizes Understanding    Educate energy conservation techniques, taught by Arnaldo Tilton HERO, RN at 02/23/2024   3:45 PM.  Learner: Patient  Readiness: Acceptance  Method: Explanation  Response: Verbalizes Understanding    Promote exercise regimen, taught by Arnaldo Tilton HERO, RN at 02/23/2024  3:45 PM.  Learner: Patient  Readiness: Acceptance  Method: Explanation  Response: Verbalizes Understanding    Educate reporting changes in condition, taught by Arnaldo Tilton HERO, RN at 02/23/2024  3:45 PM.  Learner: Patient  Readiness: Acceptance  Method: Explanation  Response: Verbalizes Understanding    COGNITIVE : ADULT-ASTHMA, taught by Arnaldo Tilton HERO, RN at 02/23/2024  3:45 PM.  Learner: Patient  Readiness: Acceptance  Method: Explanation  Response: Verbalizes Understanding    Educate follow-up care on discharge, taught by Arnaldo Tilton HERO, RN at 02/23/2024  3:45 PM.  Learner: Patient  Readiness: Acceptance  Method: Explanation  Response: Verbalizes Understanding    Educate peak expiratory flow rate, taught by Arnaldo Tilton HERO, RN at 02/23/2024  3:45 PM.  Learner: Patient  Readiness: Acceptance  Method: Explanation  Response: Verbalizes Understanding    Educate patient transfer destination, taught by Arnaldo Tilton HERO, RN at 02/23/2024  3:45 PM.  Learner: Patient  Readiness: Acceptance  Method: Explanation  Response: Verbalizes Understanding    Educate medical alert identification, taught by Arnaldo Tilton HERO, RN at 02/23/2024  3:45 PM.  Learner: Patient  Readiness: Acceptance  Method: Explanation  Response: Verbalizes Understanding    Educate recommended lifestyle changes, taught by Arnaldo Tilton HERO, RN at 02/23/2024  3:45 PM.  Learner: Patient  Readiness: Acceptance  Method: Explanation  Response: Verbalizes Understanding    Educate infection prevention measures, taught by Arnaldo Tilton HERO, RN at 02/23/2024  3:45 PM.  Learner: Patient  Readiness: Acceptance  Method: Explanation  Response: Verbalizes Understanding    Educate self-management skills, taught by Arnaldo Tilton HERO, RN at 02/23/2024  3:45 PM.  Learner: Patient  Readiness:  Acceptance  Method: Explanation  Response: Verbalizes Understanding    Educate signs and symptoms of asthma, taught by Arnaldo Tilton HERO, RN at 02/23/2024  3:45 PM.  Learner: Patient  Readiness: Acceptance  Method: Explanation  Response: Verbalizes Understanding    COGNITIVE : ADULT-ASTHMA, taught by Arnaldo Tilton HERO, RN at 02/23/2024  3:45 PM.  Learner: Patient  Readiness: Acceptance  Method: Explanation  Response: Verbalizes Understanding    Educate information regarding allergy testing methods, taught by Arnaldo Tilton HERO, RN at 02/23/2024  3:45 PM.  Learner: Patient  Readiness: Acceptance  Method: Explanation  Response: Verbalizes Understanding    Educate limitations or barriers to activity and/or exercise on discharge, taught by Arnaldo Tilton HERO, RN at 02/23/2024  3:45 PM.  Learner: Patient  Readiness: Acceptance  Method: Explanation  Response: Verbalizes Understanding    Teach reporting changes in condition, taught by Arnaldo Tilton HERO, RN at 02/23/2024  3:45 PM.  Learner: Patient  Readiness: Acceptance  Method: Explanation  Response: Verbalizes Understanding    Discuss alcohol intake, taught by Arnaldo Tilton HERO, RN at 02/23/2024  3:45 PM.  Learner: Patient  Readiness: Acceptance  Method: Explanation  Response: Verbalizes Understanding    Discuss information regarding benefits of regular exercise, taught by Arnaldo Tilton HERO, RN at 02/23/2024  3:45 PM.  Learner: Patient  Readiness: Acceptance  Method: Explanation  Response: Verbalizes Understanding    Discuss information about weight loss, taught by Arnaldo Tilton HERO, RN at 02/23/2024  3:45 PM.  Learner: Patient  Readiness: Acceptance  Method: Explanation  Response: Verbalizes Understanding    Teach appropriate dietary choices, taught by Arnaldo Tilton HERO, RN at 02/23/2024  3:45 PM.  Learner: Patient  Readiness: Acceptance  Method: Explanation  Response: Verbalizes Understanding    Discuss smoking cessation, taught by Arnaldo Tilton HERO, RN at 02/23/2024  3:45  PM.  Learner: Patient  Readiness: Acceptance  Method: Explanation  Response: Verbalizes Understanding    Discuss recommended lifestyle changes, taught by Arnaldo Tilton HERO, RN at 02/23/2024  3:45 PM.  Learner: Patient  Readiness: Acceptance  Method: Explanation  Response: Verbalizes Understanding    Teach compliance with prescribed medication regimen, taught by Arnaldo Tilton HERO, RN at 02/23/2024  3:45 PM.  Learner: Patient  Readiness: Acceptance  Method: Explanation  Response: Verbalizes Understanding    Discuss information regarding technique to measure blood pressure, taught by Arnaldo Tilton HERO, RN at 02/23/2024  3:45 PM.  Learner: Patient  Readiness: Acceptance  Method: Explanation  Response: Verbalizes Understanding    Teach information regarding medications, taught by Arnaldo Tilton HERO, RN at 02/23/2024  3:45 PM.  Learner: Patient  Readiness: Acceptance  Method: Explanation  Response: Verbalizes Understanding    Teach importance of blood pressure control, taught by Arnaldo Tilton HERO, RN at 02/23/2024  3:45 PM.  Learner: Patient  Readiness: Acceptance  Method: Explanation  Response: Verbalizes Understanding    Education Comments  No comments found.     ,    Goals Addressed                   This Visit's Progress     Conditions and Symptoms   Improving     I will schedule office visits, as directed by my provider.  I will keep my appointment or reschedule if I have to cancel.  I will notify my provider of any barriers to my plan of care.  I will follow my Zone Management tool to seek urgent or emergent care.  I will notify my provider of any symptoms that indicate a worsening of my condition.    Barriers: none  Plan for overcoming my barriers: N/A  Confidence: 9/10  Anticipated Goal Completion Date: 05/09/24    02/23/24  Will notify provider if:  You have new or worse shortness of breath.  You have new or worse chest pain.  You cough up blood.  You have a fever.  You have used your quick-relief medicine or followed  your plan for what to do if your symptoms get worse, but you are still short of breath.  Watch closely for changes in your health, and be sure to contact your doctor if:  You are coughing more deeply or more often, especially if you notice more mucus or a change in the color of your mucus.  You have new or increased swelling in your legs or belly.  You have feelings of anxiety or depression.  You need to use your antibiotic or steroid pills.  You are not getting better as expected.                 PCP/Specialist follow up:   Future Appointments         Provider Specialty Dept Phone    08/20/2024 9:20 AM Kennie Darin MATSU, MD Family Medicine [phone]            Follow Up:   Plan for next ACM outreach in approximately 1 week to complete:  - CC Protocol assessments  - disease specific assessments  - advance care planning   - goal progression  - education .   Patient  is agreeable to this plan.

## 2024-03-22 NOTE — Care Coordination (Signed)
"  Ambulatory Care Coordination Note     03/22/2024      Patient outreach attempt by this ACM today to perform care management follow up . ACM was unable to reach the patient by telephone today;   left voice message requesting a return phone call to this ACM.     ACM: Tilton CHRISTELLA Bailey, RN     PCP/Specialist follow up:   Future Appointments         Provider Specialty Dept Phone    08/20/2024 9:20 AM Kennie Darin MATSU, MD Family Medicine [phone]            Follow Up:   Plan for next ACM outreach in approximately 1 week to complete:  - CC Protocol assessments  - disease specific assessments  - advance care planning  - goal progression  - education .             "

## 2024-04-02 NOTE — Care Coordination (Signed)
 "Ambulatory Care Coordination Note     04/02/2024      Patient Current Location:  [place]      Patient contacted the ACM by telephone. Verified name and DOB with patient as identifiers.         ACM: Tilton CHRISTELLA Bailey, RN     Challenges to be reviewed by the provider   Additional needs identified to be addressed with provider No                 Method of communication with provider: none.    Utilization: Has the patient been seen in the ED since your last call? no    Care Summary Note: Patient admits to doing well. Asays cold systoms are resolved. Continue to take all ordered medications. Says he has followed up with all specialist as was ordered. Has remained compliant with low sodium, low Carb diet. ACM reviewed medical action plan and red flags with patient and discussed any barriers to care and/or understanding of plan of care, and resources available.  Patient agrees to contact the PCP or ACM office for questions related to healthcare.    Offered patient enrollment in the Remote Patient Monitoring (RPM) program for in-home monitoring: Yes, but did not enroll at this time: declined to enroll in the program becausewaiting for his meter to monitor own BS.SABRA     Assessments Completed:   Care Coordination Interventions    Referral from Primary Care Provider: No  Suggested Interventions and Community Resources  Diabetes Education: In Process  Disease Specific Clinic: In Process  Disease Association: In Process  Specialty Services Referral: In Process  Other Services: In Process  Zone Management Tools: In Process      ,   Diabetes Assessment                ,   Hypertension - Encounter Level          ,   General Assessment    Do you have any symptoms that are causing concern?: No          Medications Reviewed:   Patient denies any changes with medications and reports taking all medications as prescribed. and Completed during a previous call     Advance Care Planning:   Not reviewed during this call     Care Planning:    Education Documentation  Educate information regarding smoking cessation, taught by Bailey Tilton CHRISTELLA, RN at 04/02/2024  6:18 PM.  Learner: Patient  Readiness: Acceptance  Method: Explanation  Response: Verbalizes Understanding    Educate tobacco-use cessation benefits, taught by Bailey Tilton CHRISTELLA, RN at 04/02/2024  6:18 PM.  Learner: Patient  Readiness: Acceptance  Method: Explanation  Response: Verbalizes Understanding    Educate non-pharmacologic treatment for tobacco-use cessation, taught by Bailey Tilton CHRISTELLA, RN at 04/02/2024  6:18 PM.  Learner: Patient  Readiness: Acceptance  Method: Explanation  Response: Verbalizes Understanding    Educate signs and symptoms of nicotine withdrawal, taught by Bailey Tilton CHRISTELLA, RN at 04/02/2024  6:18 PM.  Learner: Patient  Readiness: Acceptance  Method: Explanation  Response: Verbalizes Understanding    Educate coping techniques for nicotine withdrawal, taught by Bailey Tilton CHRISTELLA, RN at 04/02/2024  6:18 PM.  Learner: Patient  Readiness: Acceptance  Method: Explanation  Response: Verbalizes Understanding    Educate relaxation techniques, taught by Bailey Tilton CHRISTELLA, RN at 04/02/2024  6:18 PM.  Learner: Patient  Readiness: Acceptance  Method: Explanation  Response: Verbalizes Understanding  Educate effective coping behavior, taught by Arnaldo Tilton HERO, RN at 04/02/2024  6:18 PM.  Learner: Patient  Readiness: Acceptance  Method: Explanation  Response: [place] Understanding    COGNITIVE : ADULT-ASTHMA, taught by Arnaldo Tilton HERO, RN at 04/02/2024  6:18 PM.  Learner: Patient  Readiness: Acceptance  Method: Explanation  Response: Verbalizes Understanding    Educate incentive spirometry usage, taught by Arnaldo Tilton HERO, RN at 04/02/2024  6:18 PM.  Learner: Patient  Readiness: Acceptance  Method: Explanation  Response: Verbalizes Understanding    Educate airway suctioning, taught by Arnaldo Tilton HERO, RN at 04/02/2024  6:18 PM.  Learner: Patient  Readiness:  Acceptance  Method: Explanation  Response: Verbalizes Understanding    Educate optimal breathing technique, taught by Arnaldo Tilton HERO, RN at 04/02/2024  6:18 PM.  Learner: Patient  Readiness: Acceptance  Method: Explanation  Response: Verbalizes Understanding    Educate coughing and deep breathing, taught by Arnaldo Tilton HERO, RN at 04/02/2024  6:18 PM.  Learner: Patient  Readiness: Acceptance  Method: Explanation  Response: Verbalizes Understanding    Educate airway irritants, taught by Arnaldo Tilton HERO, RN at 04/02/2024  6:18 PM.  Learner: Patient  Readiness: Acceptance  Method: Explanation  Response: Verbalizes Understanding    Educate activity level, taught by Arnaldo Tilton HERO, RN at 04/02/2024  6:18 PM.  Learner: Patient  Readiness: Acceptance  Method: Explanation  Response: Verbalizes Understanding    Educate information regarding physical activity cessation, taught by Arnaldo Tilton HERO, RN at 04/02/2024  6:18 PM.  Learner: Patient  Readiness: Acceptance  Method: Explanation  Response: Verbalizes Understanding    Educate energy conservation techniques, taught by Arnaldo Tilton HERO, RN at 04/02/2024  6:18 PM.  Learner: Patient  Readiness: Acceptance  Method: Explanation  Response: Verbalizes Understanding    Promote exercise regimen, taught by Arnaldo Tilton HERO, RN at 04/02/2024  6:18 PM.  Learner: Patient  Readiness: Acceptance  Method: Explanation  Response: Verbalizes Understanding    Educate reporting changes in condition, taught by Arnaldo Tilton HERO, RN at 04/02/2024  6:18 PM.  Learner: Patient  Readiness: Acceptance  Method: Explanation  Response: Verbalizes Understanding    COGNITIVE : ADULT-ASTHMA, taught by Arnaldo Tilton HERO, RN at 04/02/2024  6:18 PM.  Learner: Patient  Readiness: Acceptance  Method: Explanation  Response: Verbalizes Understanding    Educate follow-up care on discharge, taught by Arnaldo Tilton HERO, RN at 04/02/2024  6:18 PM.  Learner: Patient  Readiness:  Acceptance  Method: Explanation  Response: Verbalizes Understanding    Educate peak expiratory flow rate, taught by Arnaldo Tilton HERO, RN at 04/02/2024  6:18 PM.  Learner: Patient  Readiness: Acceptance  Method: Explanation  Response: Verbalizes Understanding    Educate patient transfer destination, taught by Arnaldo Tilton HERO, RN at 04/02/2024  6:18 PM.  Learner: Patient  Readiness: Acceptance  Method: Explanation  Response: Verbalizes Understanding    Educate medical alert identification, taught by Arnaldo Tilton HERO, RN at 04/02/2024  6:18 PM.  Learner: Patient  Readiness: Acceptance  Method: Explanation  Response: Verbalizes Understanding    Educate recommended lifestyle changes, taught by Arnaldo Tilton HERO, RN at 04/02/2024  6:18 PM.  Learner: Patient  Readiness: Acceptance  Method: Explanation  Response: Verbalizes Understanding    Educate infection prevention measures, taught by Arnaldo Tilton HERO, RN at 04/02/2024  6:18 PM.  Learner: Patient  Readiness: Acceptance  Method: Explanation  Response: Verbalizes Understanding    Educate self-management skills, taught by Arnaldo Tilton HERO, RN  at 04/02/2024  6:18 PM.  Learner: Patient  Readiness: Acceptance  Method: Explanation  Response: Verbalizes Understanding    Educate signs and symptoms of asthma, taught by Arnaldo Tilton HERO, RN at 04/02/2024  6:18 PM.  Learner: Patient  Readiness: Acceptance  Method: Explanation  Response: Verbalizes Understanding    COGNITIVE : ADULT-ASTHMA, taught by Arnaldo Tilton HERO, RN at 04/02/2024  6:18 PM.  Learner: Patient  Readiness: Acceptance  Method: Explanation  Response: Verbalizes Understanding    Educate information regarding allergy testing methods, taught by Arnaldo Tilton HERO, RN at 04/02/2024  6:18 PM.  Learner: Patient  Readiness: Acceptance  Method: Explanation  Response: Verbalizes Understanding    Educate limitations or barriers to activity and/or exercise on discharge, taught by Arnaldo Tilton HERO, RN at  04/02/2024  6:18 PM.  Learner: Patient  Readiness: Acceptance  Method: Explanation  Response: Verbalizes Understanding    Teach reporting changes in condition, taught by Arnaldo Tilton HERO, RN at 04/02/2024  6:18 PM.  Learner: Patient  Readiness: Acceptance  Method: Explanation  Response: Verbalizes Understanding    Discuss alcohol intake, taught by Arnaldo Tilton HERO, RN at 04/02/2024  6:18 PM.  Learner: Patient  Readiness: Acceptance  Method: Explanation  Response: Verbalizes Understanding    Discuss information regarding benefits of regular exercise, taught by Arnaldo Tilton HERO, RN at 04/02/2024  6:18 PM.  Learner: Patient  Readiness: Acceptance  Method: Explanation  Response: Verbalizes Understanding    Discuss information about weight loss, taught by Arnaldo Tilton HERO, RN at 04/02/2024  6:18 PM.  Learner: Patient  Readiness: Acceptance  Method: Explanation  Response: Verbalizes Understanding    Teach appropriate dietary choices, taught by Arnaldo Tilton HERO, RN at 04/02/2024  6:18 PM.  Learner: Patient  Readiness: Acceptance  Method: Explanation  Response: Verbalizes Understanding    Discuss smoking cessation, taught by Arnaldo Tilton HERO, RN at 04/02/2024  6:18 PM.  Learner: Patient  Readiness: Acceptance  Method: Explanation  Response: Verbalizes Understanding    Discuss recommended lifestyle changes, taught by Arnaldo Tilton HERO, RN at 04/02/2024  6:18 PM.  Learner: Patient  Readiness: Acceptance  Method: Explanation  Response: Verbalizes Understanding    Teach compliance with prescribed medication regimen, taught by Arnaldo Tilton HERO, RN at 04/02/2024  6:18 PM.  Learner: Patient  Readiness: Acceptance  Method: Explanation  Response: Verbalizes Understanding    Discuss information regarding technique to measure blood pressure, taught by Arnaldo Tilton HERO, RN at 04/02/2024  6:18 PM.  Learner: Patient  Readiness: Acceptance  Method: Explanation  Response: Verbalizes Understanding    Teach information  regarding medications, taught by Arnaldo Tilton HERO, RN at 04/02/2024  6:18 PM.  Learner: Patient  Readiness: Acceptance  Method: Explanation  Response: Verbalizes Understanding    Teach importance of blood pressure control, taught by Arnaldo Tilton HERO, RN at 04/02/2024  6:18 PM.  Learner: Patient  Readiness: Acceptance  Method: Explanation  Response: Verbalizes Understanding    Education Comments  No comments found.     ,    Goals Addressed                   This Visit's Progress     Conditions and Symptoms   Improving     I will schedule office visits, as directed by my provider.  I will keep my appointment or reschedule if I have to cancel.  I will notify my provider of any barriers to my plan of care.  I will follow  my Zone Management tool to seek urgent or emergent care.  I will notify my provider of any symptoms that indicate a worsening of my condition.    Barriers: none  Plan for overcoming my barriers: N/A  Confidence: 9/10  Anticipated Goal Completion Date: 05/09/24    02/23/24  Will notify provider if:  You have new or worse shortness of breath.  You have new or worse chest pain.  You cough up blood.  You have a fever.  You have used your quick-relief medicine or followed your plan for what to do if your symptoms get worse, but you are still short of breath.  Watch closely for changes in your health, and be sure to contact your doctor if:  You are coughing more deeply or more often, especially if you notice more mucus or a change in the color of your mucus.  You have new or increased swelling in your legs or belly.  You have feelings of anxiety or depression.  You need to use your antibiotic or steroid pills.  You are not getting better as expected.    04/02/24  Patient sys he has followed up with all appointments  Doing much better  Has increased Metformin  as was directed.   Waiting for new meter to check BS, ordered by provider  BP improved-140/80               PCP/Specialist follow up:   Future  Appointments         Provider Specialty Dept Phone    08/20/2024 9:20 AM Kennie Darin MATSU, MD Family Medicine [phone]            Follow Up:   Plan for next ACM outreach in approximately 2 weeks to complete:  - CC Protocol assessments  - disease specific assessments  - advance care planning   - goal progression  - education .   Patient  is agreeable to this plan.        "

## 2024-04-03 ENCOUNTER — Ambulatory Visit: Attending: Family Medicine | Primary: Family Medicine

## 2024-04-19 NOTE — Care Coordination (Signed)
"  Ambulatory Care Management Note    Date/Time:  04/18/2024     Patient Current Location: [facility]      Patient has graduated from the Ambulatory Care Management program on 04/19/2024.  Patient/family has the ability to self-manage at this time.  Care management goals have been completed. No further Ambulatory Care Manager follow up scheduled.     Goals Addressed                   This Visit's Progress     COMPLETED: Conditions and Symptoms   Improving     I will schedule office visits, as directed by my provider.  I will keep my appointment or reschedule if I have to cancel.  I will notify my provider of any barriers to my plan of care.  I will follow my Zone Management tool to seek urgent or emergent care.  I will notify my provider of any symptoms that indicate a worsening of my condition.    Barriers: none  Plan for overcoming my barriers: N/A  Confidence: 9/10  Anticipated Goal Completion Date: 05/09/24    02/23/24  Will notify provider if:  You have new or worse shortness of breath.  You have new or worse chest pain.  You cough up blood.  You have a fever.  You have used your quick-relief medicine or followed your plan for what to do if your symptoms get worse, but you are still short of breath.  Watch closely for changes in your health, and be sure to contact your doctor if:  You are coughing more deeply or more often, especially if you notice more mucus or a change in the color of your mucus.  You have new or increased swelling in your legs or belly.  You have feelings of anxiety or depression.  You need to use your antibiotic or steroid pills.  You are not getting better as expected.    04/02/24  Patient says he has followed up with all appointments  Doing much better  Has increased Metformin  as was directed.   Waiting for new meter to check BS, ordered by provider  BP improved-140/80    04/18/24  Has followed up as scheduled   Next follow up scheduled  Verbalized ability to self manage  Episode resolved, ACM  contact given.              Patient has Ambulatory Care Manager's contact information for any further questions, concerns, or needs.  Patients upcoming visits:    Future Appointments   Date Time Provider Department Center   08/20/2024  9:20 AM Kennie Darin MATSU, MD [facility]       "

## 2024-05-01 ENCOUNTER — Encounter

## 2024-05-02 MED ORDER — METFORMIN HCL 500 MG PO TABS
500 | ORAL_TABLET | Freq: Two times a day (BID) | ORAL | 1 refills | 90.00000 days | Status: AC
Start: 2024-05-02 — End: ?

## 2024-05-02 NOTE — Telephone Encounter (Signed)
"  Last appointment: 02/20/24  Next appointment: 08/20/24  Previous refill encounter(s): 02/21/24 #180    Requested Prescriptions     Pending Prescriptions Disp Refills    metFORMIN  (GLUCOPHAGE ) 500 MG tablet [Pharmacy Med Name: METFORMIN  HCL 500 MG TABLET] 180 tablet 1     Sig: TAKE 1 TABLET BY MOUTH TWICE A DAY WITH MEALS         For Pharmacy Admin Tracking Only    Program: Medication Refill  CPA in place:    Recommendation Provided To:   Intervention Detail: New Rx: 1, reason: Patient Preference  Intervention Accepted By:   Joes[place] Closed?:    Time Spent (min): 5  "

## 2024-06-28 ENCOUNTER — Inpatient Hospital Stay
Admit: 2024-06-28 | Discharge: 2024-06-28 | Disposition: A | Discharge status: Home / Self Care | Payer: Medicaid (Managed Care) | Arrived: VH | Attending: Student in an Organized Health Care Education/Training Program

## 2024-06-28 DIAGNOSIS — G44209 Tension-type headache, unspecified, not intractable: Principal | ICD-10-CM

## 2024-06-28 LAB — URINALYSIS WITH REFLEX TO CULTURE
BACTERIA, URINE: NEGATIVE /HPF
Bilirubin, Urine: NEGATIVE
Glucose, Ur: NEGATIVE mg/dL
Ketones, Urine: NEGATIVE mg/dL
Leukocyte Esterase, Urine: NEGATIVE
Nitrite, Urine: NEGATIVE
Protein, UA: NEGATIVE mg/dL
Specific Gravity, UA: 1.02
Urobilinogen, Urine: 1 EU/dL (ref 0.2–1.0)
pH, Urine: 5.5 (ref 5.0–8.0)

## 2024-06-28 LAB — CBC WITH AUTO DIFFERENTIAL
Basophils %: 1 % (ref 0.0–1.0)
Basophils Absolute: 0.06 K/UL (ref 0.00–0.10)
Eosinophils %: 9.7 % — ABNORMAL HIGH (ref 0.0–7.0)
Eosinophils Absolute: 0.57 K/UL — ABNORMAL HIGH (ref 0.00–0.40)
Hematocrit: 38.7 % (ref 36.6–50.3)
Hemoglobin: 12.9 g/dL (ref 12.1–17.0)
Immature Granulocytes %: 0.2 % (ref 0.0–0.5)
Immature Granulocytes Absolute: 0.01 K/UL (ref 0.00–0.04)
Lymphocytes %: 31.4 % (ref 12.0–49.0)
Lymphocytes Absolute: 1.85 K/UL (ref 0.80–3.50)
MCH: 28.9 pg (ref 26.0–34.0)
MCHC: 33.3 g/dL (ref 30.0–36.5)
MCV: 86.8 FL (ref 80.0–99.0)
MPV: 9.8 FL (ref 8.9–12.9)
Monocytes %: 6.5 % (ref 5.0–13.0)
Monocytes Absolute: 0.38 K/UL (ref 0.00–1.00)
Neutrophils %: 51.2 % (ref 32.0–75.0)
Neutrophils Absolute: 3.02 K/UL (ref 1.80–8.00)
Nucleated RBCs: 0 /100{WBCs}
Platelets: 305 K/uL (ref 150–400)
RBC: 4.46 M/uL (ref 4.10–5.70)
RDW: 12.2 % (ref 11.5–14.5)
WBC: 5.9 K/uL (ref 4.1–11.1)
nRBC: 0 K/uL (ref 0.00–0.01)

## 2024-06-28 LAB — COMPREHENSIVE METABOLIC PANEL
ALT: 20 U/L (ref 10–50)
AST: 18 U/L (ref 10–50)
Albumin/Globulin Ratio: 1.2 (ref 1.1–2.2)
Albumin: 3.9 g/dL (ref 3.5–5.2)
Alk Phosphatase: 58 U/L (ref 40–129)
Anion Gap: 10 mmol/L (ref 2–14)
BUN/Creatinine Ratio: 16 (ref 12–20)
BUN: 12 mg/dL (ref 6–20)
CO2: 26 mmol/L (ref 20–29)
Calcium: 9.4 mg/dL (ref 8.6–10.0)
Chloride: 102 mmol/L (ref 98–107)
Creatinine: 0.73 mg/dL (ref 0.70–1.20)
Est, Glom Filt Rate: >90 ml/min/1.73m2 (ref >59)
Globulin: 3.4 g/dL (ref 2.0–4.0)
Glucose: 133 mg/dL — ABNORMAL HIGH (ref 65–100)
Potassium: 3.3 mmol/L — ABNORMAL LOW (ref 3.5–5.1)
Sodium: 138 mmol/L (ref 136–145)
Total Bilirubin: 0.4 mg/dL (ref 0.0–1.2)
Total Protein: 7.3 g/dL (ref 6.4–8.3)

## 2024-06-28 LAB — TSH: TSH, 3rd Generation: 0.448 u[IU]/mL (ref 0.270–4.200)

## 2024-06-28 LAB — MAGNESIUM: Magnesium: 2.1 mg/dL (ref 1.6–2.6)

## 2024-06-28 MED ORDER — SODIUM CHLORIDE 0.9 % IV BOLUS
0.9 | Freq: Once | INTRAVENOUS | Status: AC
Start: 2024-06-28 — End: 2024-06-28
  Administered 2024-06-28: 21:00:00 1000 mL via INTRAVENOUS

## 2024-06-28 MED ORDER — BUTALBITAL-APAP-CAFFEINE 50-325-40 MG PO TABS
50-325-40 | ORAL | Status: AC
Start: 2024-06-28 — End: 2024-06-28
  Administered 2024-06-28: 21:00:00 1 via ORAL

## 2024-06-28 MED ORDER — KETOROLAC TROMETHAMINE 30 MG/ML IJ SOLN
30 | Freq: Once | INTRAMUSCULAR | Status: AC
Start: 2024-06-28 — End: 2024-06-28
  Administered 2024-06-28: 21:00:00 30 mg via INTRAVENOUS

## 2024-06-28 MED ORDER — DEXAMETHASONE SOD PHOSPHATE PF 10 MG/ML IJ SOLN
10 | Freq: Once | INTRAMUSCULAR | Status: AC
Start: 2024-06-28 — End: 2024-06-28
  Administered 2024-06-28: 21:00:00 10 mg via INTRAVENOUS

## 2024-06-28 MED FILL — SODIUM CHLORIDE 0.9 % IV SOLN: 0.9 % | INTRAVENOUS | Qty: 1000 | Fill #0

## 2024-06-28 MED FILL — BUTALBITAL-APAP-CAFFEINE 50-325-40 MG PO TABS: 50-325-40 mg | ORAL | Qty: 1 | Fill #0

## 2024-06-28 MED FILL — DEXAMETHASONE SOD PHOSPHATE PF 10 MG/ML IJ SOLN: 10 mg/mL | INTRAMUSCULAR | Qty: 1 | Fill #0

## 2024-06-28 MED FILL — KETOROLAC TROMETHAMINE 30 MG/ML IJ SOLN: 30 mg/mL | INTRAMUSCULAR | Qty: 1 | Fill #0

## 2024-06-28 NOTE — ED Triage Notes (Signed)
 Pt arrives ambulatory with cc of headache for a month,  states sometimes he gets awoken from sleep and his fitbit states he is having a HR up to 170 at times.  Pt HR in triage 85

## 2024-06-28 NOTE — Discharge Instructions (Signed)
 It was a pleasure taking care of you in the emergency department today. I hope you are feeling better.    Primary diagnosis: Headache, unspecified cause; intermittent fast heart rate (tachycardia) under evaluation; mild high blood sugar; small amount of blood in urine.    Aftercare Instructions:  You came to the emergency department because you have had headaches for about a month, which got worse in the past week. You also noticed your heart sometimes beats faster, your vision has been blurry, your mouth feels dry, and you are urinating more often. These symptoms can be caused by many things, including changes in blood sugar, dehydration, or side effects from medicines.    Your blood tests showed your blood sugar was a little high (133), which is similar to your previous results. This means your diabetes is still under control, but you should keep monitoring your blood sugar as your doctor recommends. Your complete blood count (CBC) was normal, which means there is no sign of infection or anemia. Your thyroid test (TSH) was also normal. Your urine test showed a small amount of blood, but no infection. Sometimes, blood in the urine can be caused by kidney stones or other minor issues, but there was no sign of a serious problem today.    Your heart rate was normal when checked in the emergency department. You had a heart monitor test (Holter monitor) in November, which was normal. An EKG was done today, and the results will be reviewed with your heart doctor.    No new medicines were prescribed at discharge.    Please be sure to follow up with your primary care provider. Call to make an appointment as soon as possible and let them know you were seen in the emergency department.

## 2024-06-28 NOTE — ED Provider Notes (Signed)
 [facility]  EMERGENCY DEPARTMENT ENCOUNTER    Patient Name: Jason Greene  MRN: [id number]  Birth Date: [birth year]  Provider: Arthea Fitzpatrick, MD  PCP: Kennie Darin MATSU, MD  Time/Date of evaluation:  06/28/24    Previous Hisotry     PAST MEDICAL HISTORY:  Past Medical History:   Diagnosis Date    Asthma     Diabetes (HCC)     oral agents    Erectile dysfunction     GERD (gastroesophageal reflux disease)     High cholesterol     Hypertension     Dr. Lindle    Obesity     OSA (obstructive sleep apnea)     uses CPAP       PAST SURGICAL HISTORY:  Past Surgical History:   Procedure Laterality Date    CHOLECYSTECTOMY      COLONOSCOPY      HERNIA REPAIR      HERNIA REPAIR N/A 01/03/2024    ROBOT ASSISTED LAPAROSCOPIC RECURRENT LEFT INGUINAL HERNIA REPAIR WITH MESH performed by Geni Derick SAILOR, MD at [facility] MAIN OR    LUMBAR SPINE SURGERY  1996    TONSILLECTOMY      UPPER GASTROINTESTINAL ENDOSCOPY         FAMILY HISTORY:  Family History   Problem Relation Age of Onset    Hypertension Mother     Cancer Father     No Known Problems Son     No Known Problems Son     No Known Problems Daughter     No Known Problems Daughter        SOCIAL HISTORY:  Social History     Tobacco Use    Smoking status: Former     Current packs/day: 0.00     Average packs/day: 0.5 packs/day for 15.0 years (7.5 ttl pk-yrs)     Types: Cigarettes     Start date: 10/03/1986     Quit date: 10/02/2001     Years since quitting: 22.7     Passive exposure: Past    Smokeless tobacco: Never   Vaping Use    Vaping status: Never Used   Substance Use Topics    Alcohol use: Yes     Alcohol/week: 7.0 standard drinks of alcohol     Types: 7 Glasses of wine per week     Comment: every day    Drug use: Never       MEDICATIONS:  No current facility-administered medications on file prior to encounter.     Current Outpatient Medications on File Prior to Encounter   Medication Sig Dispense Refill    metFORMIN  (GLUCOPHAGE ) 500 MG tablet TAKE 1  TABLET BY MOUTH TWICE A DAY WITH MEALS 180 tablet 1    atorvastatin  (LIPITOR) 20 MG tablet TAKE 1 TABLET BY MOUTH EVERY DAY IN THE EVENING 90 tablet 3    albuterol  sulfate HFA (VENTOLIN  HFA) 108 (90 Base) MCG/ACT inhaler Inhale 2 puffs into the lungs 4 times daily as needed for Wheezing (Patient not taking: Reported on 06/29/2024) 54 g 1    famotidine  (PEPCID ) 20 MG tablet Take 1 tablet by mouth 2 times daily (Patient not taking: Reported on 06/29/2024) 30 tablet 0    ipratropium 0.5 mg-albuterol  2.5 mg (DUONEB ) 0.5-2.5 (3) MG/3ML SOLN nebulizer solution Inhale 3 mLs into the lungs every 4 hours (Patient not taking: Reported on 06/29/2024) 360 mL 0    Hypertonic Nasal Wash (SINUS RINSE BOTTLE KIT) PACK 1 kit by  Nasal route 4 times daily as needed (congestion) 1 each 0    metoprolol  succinate (TOPROL  XL) 50 MG extended release tablet Take 1 tablet by mouth daily After a meal 90 tablet 1    valsartan -hydroCHLOROthiazide  (DIOVAN -HCT) 160-25 MG per tablet Take 1 tablet by mouth daily 90 tablet 1    tamsulosin  (FLOMAX ) 0.4 MG capsule Take 1 capsule by mouth daily 90 capsule 1    acetaminophen  (TYLENOL ) 325 MG tablet Take 2 tablets by mouth 4 times daily as needed for Pain      ondansetron  (ZOFRAN -ODT) 4 MG disintegrating tablet Take 1 tablet by mouth 3 times daily as needed for Nausea or Vomiting 8 tablet 0    Budeson-Glycopyrrol-Formoterol (BREZTRI AEROSPHERE) 160-9-4.8 MCG/ACT AERO Inhale 2 puffs into the lungs daily (Patient not taking: Reported on 06/29/2024)      albuterol  sulfate HFA (PROVENTIL ;VENTOLIN ;PROAIR ) 108 (90 Base) MCG/ACT inhaler Inhale 2 puffs into the lungs every 4 hours as needed (Patient not taking: Reported on 06/29/2024)      montelukast (SINGULAIR) 10 MG tablet Take 1 tablet by mouth nightly (Patient not taking: Reported on 06/29/2024)      pantoprazole  (PROTONIX ) 40 MG tablet Take 1 tablet by mouth 2 times daily      aspirin 81 MG EC tablet Take by mouth daily      fluticasone  (FLONASE) 50 MCG/ACT  nasal spray by Nasal route daily as needed for Allergies (Patient not taking: Reported on 06/29/2024)         ALLERGIES:  Allergies   Allergen Reactions    Lisinopril Cough       ED Note     Chief Complaint   Patient presents with    Headache         HISTORY OF PRESENT ILLNESS:  Time Seen: 14:55    Patient is a 54 year old male presenting with headache persisting for one month, which has somewhat improved since arrival. He reports intermittent episodes of waking from sleep with palpitations, noting heart rates as high as 170 beats per minute during these episodes. He denies numbness, tingling, visual changes, nausea, vomiting, gastrointestinal symptoms, chest pain, or shortness of breath. He also denies diarrhea and any other associated symptoms. Previous cardiac evaluation, including Holter monitor and echocardiogram, was negative for significant findings.     Review of systems: Positive for headache and intermittent nocturnal palpitations. Negative for numbness, tingling, visual changes, nausea, vomiting, gastrointestinal symptoms, diarrhea, chest pain, and shortness of breath.    REVIEW OF SYSTEMS:  Neurological: Denies numbness, tingling, vision changes, headache present for one month.  Cardiovascular: Reports intermittent nocturnal palpitations, denies chest pain or shortness of breath.  Gastrointestinal: Denies nausea, vomiting, diarrhea.  Constitutional: Denies fever, headache improved since arrival.    PHYSICAL EXAM:  General: Alert and oriented.  Skin: No lesions or rashes noted.  Head: Normocephalic, atraumatic.  HEENT: Conjunctiva clear, extraocular movements intact, pupils equal, round, and reactive to light, mucous membranes moist.  Neck: Supple, normal range of motion.  Heart: Regular rate and rhythm, no murmur, gallop, or rubs.  Lungs: Clear to auscultation, no rales, rhonchi, or wheezes.  Abdomen: Soft and nontender, bowel sounds normal, no mass or hernia.  Back: Spine normal without deformity or  tenderness, no CVA tenderness.  Extremities: No deformities or edema, peripheral pulses intact.  Neurologic: Cranial nerves II-XII normal, normal sensation and motor exam.  Psychiatric: Oriented x3, normal mood and affect.    EVALUATIONS:  ED Course    Initial Differential Diagnosis:  -  Primary headache disorder  - Cardiac arrhythmia (paroxysmal tachycardia, palpitations)  - Secondary causes of headache (including hypertension-related headache)  - Endocrine causes (thyroid dysfunction)    Initial Workup and Treatment:  - Basic laboratory evaluation: CBC, CMP, magnesium, TSH  - Cardiac monitoring initiated  - EKG is interpreted by me: Heart rate of 77, normal sinus rhythm with left axis deviation, normal intervals, no ST elevations or depressions consistent with ischemia.  Cardiac Monitoring:  A Cardiac Monitor/rhythm strip was ordered and revealed sinus rhythm, heart rate of 81, and no ectopy as per my interpretation.    Procedures:  None performed.    Reassessments:    RE-EVALUATION AT 05:10 PM:  - Laboratory results: CBC normal (WBC 5.9, hemoglobin 12.9), CMP largely normal except for slightly decreased potassium at 3.3, creatinine normal at 0.73, TSH 0.448 (normal), magnesium 2.1, urinalysis unremarkable.  - Patient received 1 liter of normal saline, 10 mg Decadron , 30 mg Ketorolac , and Fioricet .  - Patient reports improvement of symptoms.  - Patient is stable and ready for discharge.    MEDICAL DECISION-MAKING:  Medical Decision Making:    54 year old male presenting with a one-month history of headache and intermittent nocturnal palpitations. On arrival, vital signs were within normal limits. Headache had somewhat improved since arrival. He reported episodes of waking from sleep with palpitations and heart rates as high as 170 bpm during these episodes. No associated chest pain, shortness of breath, or neurological symptoms. Previous cardiac evaluation, including Holter monitor and echocardiogram, was negative  for significant findings.    Differential Diagnosis:  - Primary headache disorder  - Cardiac arrhythmia (paroxysmal tachycardia, palpitations)  - Secondary causes of headache (including hypertension-related headache)  - Endocrine causes (thyroid dysfunction)    Final Diagnoses:  - Primary headache disorder  - Palpitations, likely benign given negative ED and prior cardiac workup    Plan:  - Laboratory evaluation: CBC, CMP, magnesium, TSH, and urinalysis were obtained. My interpretation: CBC normal (WBC 5.9, hemoglobin 12.9), CMP largely normal except for slightly decreased potassium at 3.3, creatinine normal at 0.73, TSH 0.448 (normal), magnesium 2.1, urinalysis unremarkable.  - EKG interpreted by me: Heart rate of 77, normal sinus rhythm with left axis deviation, normal intervals, no ST elevations or depressions consistent with ischemia.  - Cardiac monitoring: Sinus rhythm, heart rate of 81, and no ectopy as per my interpretation.  - Therapeutic interventions: 1 liter of normal saline, 10 mg Decadron , 30 mg Ketorolac , and Furacet administered for symptom management.  - Patient was reassessed after treatment, reported improvement in symptoms, and was found to be stable.  - Disposition: Patient is stable and ready for discharge.    Ordered Labs:  - CBC  - CMP  - magnesium  - TSH  - urinalysis    Ordered Tests/Imaging:  - EKG  - Cardiac Monitor/rhythm strip    Test Interpretations:  - I independently interpreted the EKG. Results: Heart rate of 77, normal sinus rhythm with left axis deviation, normal intervals, no ST elevations or depressions consistent with ischemia.  - I independently interpreted the Cardiac Monitor/rhythm strip. Results: Sinus rhythm, heart rate of 81, and no ectopy.    Medications/Prescriptions:  - IV Normal Saline  - Decadron   - Ketorolac   - Fioricet             Procedures    Data     Patient was given the following medications:  Medications   sodium chloride  0.9 % bolus 1,000 mL (0 mLs IntraVENous  Stopped 06/28/24 1640)   ketorolac  (TORADOL ) injection 30 mg (30 mg IntraVENous Given 06/28/24 1558)   dexAMETHasone  (PF) (DECADRON ) IntraVENous 10 mg (10 mg IntraVENous Given 06/28/24 1558)   butalbital -acetaminophen -caffeine  (FIORICET , ESGIC ) per tablet 1 tablet (1 tablet Oral Given 06/28/24 1557)       Results:  Results for orders placed or performed during the hospital encounter of 06/28/24   CBC with Auto Differential   Result Value Ref Range    WBC 5.9 4.1 - 11.1 K/uL    RBC 4.46 4.10 - 5.70 M/uL    Hemoglobin 12.9 12.1 - 17.0 g/dL    Hematocrit 61.2 63.3 - 50.3 %    MCV 86.8 80.0 - 99.0 FL    MCH 28.9 26.0 - 34.0 PG    MCHC 33.3 30.0 - 36.5 g/dL    RDW 87.7 88.4 - 85.4 %    Platelets 305 150 - 400 K/uL    MPV 9.8 8.9 - 12.9 FL    Nucleated RBCs 0.0 0 PER 100 WBC    nRBC 0.00 0.00 - 0.01 K/uL    Neutrophils % 51.2 32.0 - 75.0 %    Lymphocytes % 31.4 12.0 - 49.0 %    Monocytes % 6.5 5.0 - 13.0 %    Eosinophils % 9.7 (H) 0.0 - 7.0 %    Basophils % 1.0 0.0 - 1.0 %    Immature Granulocytes % 0.2 0.0 - 0.5 %    Neutrophils Absolute 3.02 1.80 - 8.00 K/UL    Lymphocytes Absolute 1.85 0.80 - 3.50 K/UL    Monocytes Absolute 0.38 0.00 - 1.00 K/UL    Eosinophils Absolute 0.57 (H) 0.00 - 0.40 K/UL    Basophils Absolute 0.06 0.00 - 0.10 K/UL    Immature Granulocytes Absolute 0.01 0.00 - 0.04 K/UL    Differential Type AUTOMATED     Comprehensive Metabolic Panel   Result Value Ref Range    Sodium 138 136 - 145 mmol/L    Potassium 3.3 (L) 3.5 - 5.1 mmol/L    Chloride 102 98 - 107 mmol/L    CO2 26 20 - 29 mmol/L    Anion Gap 10 2 - 14 mmol/L    Glucose 133 (H) 65 - 100 mg/dL    BUN 12 6 - 20 MG/DL    Creatinine 9.26 9.29 - 1.20 MG/DL    BUN/Creatinine Ratio 16 12 - 20      Est, Glom Filt Rate >90 >59 ml/min/1.65m2    Calcium  9.4 8.6 - 10.0 MG/DL    Total Bilirubin 0.4 0.0 - 1.2 MG/DL    ALT 20 10 - 50 U/L    AST 18 10 - 50 U/L    Alk Phosphatase 58 40 - 129 U/L    Total Protein 7.3 6.4 - 8.3 g/dL    Albumin 3.9 3.5 - 5.2 g/dL     Globulin 3.4 2.0 - 4.0 g/dL    Albumin/Globulin Ratio 1.2 1.1 - 2.2     Magnesium   Result Value Ref Range    Magnesium 2.1 1.6 - 2.6 mg/dL   Urinalysis with Reflex to Culture    Specimen: Urine   Result Value Ref Range    Color, UA YELLOW/STRAW      Appearance CLEAR CLEAR      Specific Gravity, UA 1.020      pH, Urine 5.5 5.0 - 8.0      Protein, UA Negative NEG mg/dL    Glucose, Ur Negative NEG mg/dL  Ketones, Urine Negative NEG mg/dL    Bilirubin, Urine Negative NEG      Blood, Urine TRACE (A) NEG      Urobilinogen, Urine 1.0 0.2 - 1.0 EU/dL    Nitrite, Urine Negative NEG      Leukocyte Esterase, Urine Negative NEG      WBC, UA 0-4 0 - 4 /hpf    RBC, UA 0-5 0 - 5 /hpf    Epithelial Cells, UA FEW FEW /lpf    BACTERIA, URINE Negative NEG /hpf    Urine Culture if Indicated CULTURE NOT INDICATED BY UA RESULT CNI     TSH   Result Value Ref Range    TSH, 3rd Generation 0.448 0.270 - 4.200 uIU/mL   EKG 12 Lead (Abn HR)   Result Value Ref Range    Ventricular Rate 77 BPM    Atrial Rate 77 BPM    P-R Interval 176 ms    QRS Duration 108 ms    Q-T Interval 380 ms    QTc Calculation (Bazett) 430 ms    P Axis 40 degrees    R Axis -38 degrees    T Axis 0 degrees    Diagnosis       Normal sinus rhythm  Left axis deviation  Abnormal ECG  When compared with ECG of 03-Jan-2024 08:31,  No significant change was found  <<This EKG report interpreted by Vicenta Jaeger, MD>>  Confirmed by Jaeger MD, Vicenta ([id number]) on 07/03/2024 9:05:22 AM       All EKGs Interpreted by me, Arthea Fitzpatrick, MD.    No orders to display       Vitals:    06/28/24 1459   BP: (!) 156/87   Pulse: 81   Resp: 20   Temp: 97.8 F (36.6 C)   TempSrc: Temporal   SpO2: 99%   Weight: 122.5 kg (270 lb)   Height: 1.803 m (5' 11)       Impression & Plan     1. Tension headache        DISPOSITION Decision To Discharge 06/28/2024 05:13:11 PM   DISPOSITION CONDITION Stable        Discharge Note: The patient is stable for discharge. The signs, symptoms, diagnosis,  and discharge instructions have been discussed, understanding conveyed, and agreed upon. The patient is to follow up as recommended or return to ER should their symptoms worsen.   FOLLOWUPBETHA Kennie Darin KANDICE, MD  [address] [place] [address]  [phone]    In 1 week      [facility] Emergency Department  [address] [place]  [address]  [phone]    If symptoms worsen  DISCHARGE MEDS:      Medication List        ASK your doctor about these medications      acetaminophen  325 MG tablet  Commonly known as: TYLENOL   Take 2 tablets by mouth 4 times daily as needed for Pain     * albuterol  sulfate HFA 108 (90 Base) MCG/ACT inhaler  Commonly known as: PROVENTIL ;VENTOLIN ;PROAIR      * albuterol  sulfate HFA 108 (90 Base) MCG/ACT inhaler  Commonly known as: Ventolin  HFA  Inhale 2 puffs into the lungs 4 times daily as needed for Wheezing     aspirin 81 MG EC tablet     atorvastatin  20 MG tablet  Commonly known as: LIPITOR  TAKE 1 TABLET BY MOUTH EVERY DAY IN THE EVENING  Breztri Aerosphere 160-9-4.8 MCG/ACT Aero  Generic drug: Budeson-Glycopyrrol-Formoterol     famotidine  20 MG tablet  Commonly known as: PEPCID   Take 1 tablet by mouth 2 times daily     fluticasone  50 MCG/ACT nasal spray  Commonly known as: FLONASE     ipratropium 0.5 mg-albuterol  2.5 mg 0.5-2.5 (3) MG/3ML Soln nebulizer solution  Commonly known as: DUONEB   Inhale 3 mLs into the lungs every 4 hours     metFORMIN  500 MG tablet  Commonly known as: GLUCOPHAGE   TAKE 1 TABLET BY MOUTH TWICE A DAY WITH MEALS     metoprolol  succinate 50 MG extended release tablet  Commonly known as: TOPROL  XL  Take 1 tablet by mouth daily After a meal     montelukast 10 MG tablet  Commonly known as: SINGULAIR     ondansetron  4 MG disintegrating tablet  Commonly known as: ZOFRAN -ODT  Take 1 tablet by mouth 3 times daily as needed for Nausea or Vomiting     pantoprazole  40 MG tablet  Commonly known as: PROTONIX      Sinus Rinse Bottle Kit Pack  1 kit by  Nasal route 4 times daily as needed (congestion)     tamsulosin  0.4 MG capsule  Commonly known as: FLOMAX   Take 1 capsule by mouth daily     valsartan -hydroCHLOROthiazide  160-25 MG per tablet  Commonly known as: DIOVAN -HCT  Take 1 tablet by mouth daily           * This list has 2 medication(s) that are the same as other medications prescribed for you. Read the directions carefully, and ask your doctor or other care provider to review them with you.                   PATIENT REFERRED TO:    Kennie Darin MATSU, MD  [address] [place] [address]  [phone]    In 1 week      [facility] Emergency Department  [address] [place]  [address]  [phone]    If symptoms worsen      DISCHARGE MEDICATIONS:       Medication List        ASK your doctor about these medications      acetaminophen  325 MG tablet  Commonly known as: TYLENOL   Take 2 tablets by mouth 4 times daily as needed for Pain     * albuterol  sulfate HFA 108 (90 Base) MCG/ACT inhaler  Commonly known as: PROVENTIL ;VENTOLIN ;PROAIR      * albuterol  sulfate HFA 108 (90 Base) MCG/ACT inhaler  Commonly known as: Ventolin  HFA  Inhale 2 puffs into the lungs 4 times daily as needed for Wheezing     aspirin 81 MG EC tablet     atorvastatin  20 MG tablet  Commonly known as: LIPITOR  TAKE 1 TABLET BY MOUTH EVERY DAY IN THE EVENING     Breztri Aerosphere 160-9-4.8 MCG/ACT Aero  Generic drug: Budeson-Glycopyrrol-Formoterol     famotidine  20 MG tablet  Commonly known as: PEPCID   Take 1 tablet by mouth 2 times daily     fluticasone  50 MCG/ACT nasal spray  Commonly known as: FLONASE     ipratropium 0.5 mg-albuterol  2.5 mg 0.5-2.5 (3) MG/3ML Soln nebulizer solution  Commonly known as: DUONEB   Inhale 3 mLs into the lungs every 4 hours     metFORMIN  500 MG tablet  Commonly known as: GLUCOPHAGE   TAKE 1 TABLET BY MOUTH TWICE A DAY WITH MEALS  metoprolol  succinate 50 MG extended release tablet  Commonly known as: TOPROL  XL  Take 1 tablet by mouth daily  After a meal     montelukast 10 MG tablet  Commonly known as: SINGULAIR     ondansetron  4 MG disintegrating tablet  Commonly known as: ZOFRAN -ODT  Take 1 tablet by mouth 3 times daily as needed for Nausea or Vomiting     pantoprazole  40 MG tablet  Commonly known as: PROTONIX      Sinus Rinse Bottle Kit Pack  1 kit by Nasal route 4 times daily as needed (congestion)     tamsulosin  0.4 MG capsule  Commonly known as: FLOMAX   Take 1 capsule by mouth daily     valsartan -hydroCHLOROthiazide  160-25 MG per tablet  Commonly known as: DIOVAN -HCT  Take 1 tablet by mouth daily           * This list has 2 medication(s) that are the same as other medications prescribed for you. Read the directions carefully, and ask your doctor or other care provider to review them with you.                  DISCONTINUED MEDICATIONS:    Discharge Medication List as of 06/28/2024  5:13 PM          (Please note that parts of this dictation were completed with voice recognition software. Quite often unanticipated grammatical, syntax, homophones, and other interpretive errors are inadvertently transcribed by the computer software. Please disregards these errors. Please excuse any errors that have escaped final proofreading.)    I am the Primary Clinician of Record.   Arthea Fitzpatrick, MD           Mitchelle Goerner, Arthea LABOR, MD  07/11/24 [phone]

## 2024-06-29 ENCOUNTER — Inpatient Hospital Stay
Admit: 2024-06-29 | Discharge: 2024-06-30 | Disposition: A | Discharge status: Home / Self Care | Payer: Medicaid (Managed Care) | Arrived: WI | Attending: Student in an Organized Health Care Education/Training Program

## 2024-06-29 ENCOUNTER — Ambulatory Visit: Admit: 2024-06-29 | Discharge: 2024-06-29 | Payer: Medicaid (Managed Care) | Primary: Family Medicine

## 2024-06-29 ENCOUNTER — Emergency Department: Admit: 2024-06-29 | Payer: Medicaid (Managed Care) | Primary: Family Medicine

## 2024-06-29 VITALS — BP 119/72 | HR 80 | Temp 98.30000°F | Resp 16 | Wt 252.0 lb

## 2024-06-29 DIAGNOSIS — G4452 New daily persistent headache (NDPH): Principal | ICD-10-CM

## 2024-06-29 LAB — CBC WITH AUTO DIFFERENTIAL
Basophils %: 0.3 % (ref 0.0–1.0)
Basophils Absolute: 0.04 K/UL (ref 0.00–0.10)
Eosinophils %: 1.1 % (ref 0.0–7.0)
Eosinophils Absolute: 0.14 K/UL (ref 0.00–0.40)
Hematocrit: 38.3 % (ref 36.6–50.3)
Hemoglobin: 12.6 g/dL (ref 12.1–17.0)
Immature Granulocytes %: 0.5 % (ref 0.0–0.5)
Immature Granulocytes Absolute: 0.06 K/UL — ABNORMAL HIGH (ref 0.00–0.04)
Lymphocytes %: 19.2 % (ref 12.0–49.0)
Lymphocytes Absolute: 2.39 K/UL (ref 0.80–3.50)
MCH: 28.8 pg (ref 26.0–34.0)
MCHC: 32.9 g/dL (ref 30.0–36.5)
MCV: 87.4 FL (ref 80.0–99.0)
MPV: 9.6 FL (ref 8.9–12.9)
Monocytes %: 7.1 % (ref 5.0–13.0)
Monocytes Absolute: 0.89 K/UL (ref 0.00–1.00)
Neutrophils %: 71.8 % (ref 32.0–75.0)
Neutrophils Absolute: 8.95 K/UL — ABNORMAL HIGH (ref 1.80–8.00)
Nucleated RBCs: 0 /100{WBCs}
Platelets: 300 K/uL (ref 150–400)
RBC: 4.38 M/uL (ref 4.10–5.70)
RDW: 12.3 % (ref 11.5–14.5)
WBC: 12.5 K/uL — ABNORMAL HIGH (ref 4.1–11.1)
nRBC: 0 K/uL (ref 0.00–0.01)

## 2024-06-29 LAB — COMPREHENSIVE METABOLIC PANEL
ALT: 23 U/L (ref 10–50)
AST: 20 U/L (ref 10–50)
Albumin/Globulin Ratio: 1.3 (ref 1.1–2.2)
Albumin: 4.1 g/dL (ref 3.5–5.2)
Alk Phosphatase: 54 U/L (ref 40–129)
Anion Gap: 14 mmol/L (ref 2–14)
BUN/Creatinine Ratio: 14 (ref 12–20)
BUN: 12 mg/dL (ref 6–20)
CO2: 25 mmol/L (ref 20–29)
Calcium: 9.5 mg/dL (ref 8.6–10.0)
Chloride: 103 mmol/L (ref 98–107)
Creatinine: 0.85 mg/dL (ref 0.70–1.20)
Est, Glom Filt Rate: >90 ml/min/1.73m2 (ref >59)
Globulin: 3.2 g/dL (ref 2.0–4.0)
Glucose: 126 mg/dL — ABNORMAL HIGH (ref 65–100)
Potassium: 3.5 mmol/L (ref 3.5–5.1)
Sodium: 141 mmol/L (ref 136–145)
Total Bilirubin: 0.4 mg/dL (ref 0.0–1.2)
Total Protein: 7.2 g/dL (ref 6.4–8.3)

## 2024-06-29 LAB — EXTRA TUBES HOLD

## 2024-06-29 MED ORDER — DIPHENHYDRAMINE HCL 50 MG/ML IJ SOLN
50 | INTRAMUSCULAR | Status: AC
Start: 2024-06-29 — End: 2024-06-29
  Administered 2024-06-29: 23:00:00 25 mg via INTRAVENOUS

## 2024-06-29 MED ORDER — PROCHLORPERAZINE EDISYLATE 10 MG/2ML IJ SOLN
10 | Freq: Once | INTRAMUSCULAR | Status: AC
Start: 2024-06-29 — End: 2024-06-29
  Administered 2024-06-29: 23:00:00 10 mg via INTRAVENOUS

## 2024-06-29 MED ORDER — SODIUM CHLORIDE 0.9 % IV BOLUS
0.9 | Freq: Once | INTRAVENOUS | Status: AC
Start: 2024-06-29 — End: 2024-06-29
  Administered 2024-06-29: 23:00:00 1000 mL via INTRAVENOUS

## 2024-06-29 MED ORDER — DEXAMETHASONE 4 MG PO TABS
4 | ORAL | Status: AC
Start: 2024-06-29 — End: 2024-06-29
  Administered 2024-06-29: 23:00:00 10 mg via ORAL

## 2024-06-29 MED ORDER — IOPAMIDOL 76 % IV SOLN
76 | Freq: Once | INTRAVENOUS | Status: DC | PRN
Start: 2024-06-29 — End: 2024-06-29

## 2024-06-29 MED FILL — DIPHENHYDRAMINE HCL 50 MG/ML IJ SOLN: 50 mg/mL | INTRAMUSCULAR | Qty: 1

## 2024-06-29 MED FILL — PROCHLORPERAZINE EDISYLATE 10 MG/2ML IJ SOLN: 10 MG/2ML | INTRAMUSCULAR | Qty: 2

## 2024-06-29 MED FILL — SODIUM CHLORIDE 0.9 % IV SOLN: 0.9 % | INTRAVENOUS | Qty: 1000

## 2024-06-29 MED FILL — DEXAMETHASONE 4 MG PO TABS: 4 mg | ORAL | Qty: 3

## 2024-06-29 NOTE — Progress Notes (Signed)
 06/29/2024   Jason Greene (DOB: 25-Jun-1970) is a 54 y.o. male, here for evaluation of the following chief complaint(s):  Headache, Dizziness, and Tinnitus (About 2 weeks ago Pt started with headaches, dizziness and ringing in his ears. Pt was seen yesterday at [facility]. [employer]. For these Sx and they did an EEG and did blood work. Was told to follow up with his PCP for these Sx, but Pt was worried because they aren't letting up. Was given something for his headache.)   as documented by Medical Assistant.     ASSESSMENT/PLAN:  Below is the assessment and plan developed based on review of pertinent history, physical exam, labs, studies, and medications.       Assessment & Plan  New daily persistent headache     Orders:    [facility] Rolfe Millman, DO, Neurology, [place] ([address])    Episode of dizziness            Ringing in ears, bilateral                Procedures:   Procedures    MDM:   This patient presents with a persistent headache with photophobia, dizziness, ringing in ears.  Additional differentials considered:but not limited to:  CVA/ TIA ( no: speech or gait disturbance, facial droop, unilateral weakness, reported changes in vision), Subarachnoid hemorrhage: (no: sudden onset of severe thunderclap like pain, changes in baseline consciousness, recent falls / trauma/ LOC, limited neck flexion), Bacterial Meningitis (no: fever,  neck pain possible indicating nuchal rigidity), Temporal arteritis (Giant cell arteritis): (no: ongoing visual disturbances, jaw claudication, fever, unilateral temporal artery abnormalities of significant tenderness to palpation of temporal/scalp area or presence of cord like palpation), Acute angel glaucoma: (no: sclera erythema/ painful eye, steamy cornea, fixed pupil)    Patient is nontoxic. Alert, Awake, Vital signs are without persistent hypertension, tachycardia, tachypnea, fever, hypoxia.  Reassuring Neurological exam; No history of recent head trauma.     However due to  persistent headache with inability to provide pain control medications in clinic beyond Tylenol  due to patient's history of hypertension/heart failure advised patient to present to the emergency department for further assessment evaluation and treatment of headaches.  Additionally patient given referral to neurology for further management and evaluation.    Due to potential severity of illness and risk of decompensation patient care escalated to emergency department for further management and work up. Discussed/Advised calling 911 for pt to be transported via EMS for prompt care. Patient/Family declined; preference to take personal transport. Patient escalated to ED by personal transport for further management and care.     Follow up:  No follow-ups on file.  Follow up immediately for any new, worsening or changes or if symptoms are not improving over the next 5-7 days.       SUBJECTIVE/OBJECTIVE:  Headache, Dizziness, and Tinnitus (About 2 weeks ago Pt started with headaches, dizziness and ringing in his ears. Pt was seen yesterday at [facility]. [employer]. For these Sx and they did an EEG and did blood work. Was told to follow up with his PCP for these Sx, but Pt was worried because they aren't letting up. Was given something for his headache.)   as documented by Medical Assistant.   Patient reports for greater than 2 weeks has had a headache that starts on the left side of his head that then moves around his entire head and feels like a squeezing continuous ache that is now affecting his day-to-day  life.  Whenever he goes down to either tie his shoes or pick something up he reports significant dizziness and is having trouble even picking up things off the ground.  Recently has headache has now been accompanied with significant ringing in both his ears.  He is also having sensitivity to noise.  Denies any vision changes or sensitivity to light.  Patient reports that he has a history of neck stiffness and that this pain  feels very different from that moment.  He has been taking over-the-counter medications however symptoms still persist.  06/28/2024, patient presented to the emergency department for his headaches, potassium was 3.3 however not replaced, additionally EKG was normal on lab work without significant findings.  Patient was given butalbital /acetaminophen /caffeine  pill, dexamethasone , Toradol , and normal saline with initial improvement in headache but not full resolution.  Patient was discharged however upon waking up this morning had exactly the same headache symptoms   Patient denies  Fevers, Chills,  Abdominal Pain N/V/D, Chest Pain, Shortness of breath, New Numbness/ Tingling, one-sided weakness, slurred speech.  Patient does endorse a history of migraines 10 years ago and was seeing a neurologist however migraines stopped and is no longer on medication for them.                      History provided by:  Patient  Language interpreter used: No       Past encounters or results reviewed:   External Records Reviewed: None        Physical Exam  Vitals reviewed.   Constitutional:       General: He is awake. He is not in acute distress.     Appearance: Normal appearance. He is not toxic-appearing or diaphoretic.   HENT:      Head: Normocephalic and atraumatic. No raccoon eyes, Battle's sign, contusion, right periorbital erythema or left periorbital erythema.      Jaw: No trismus, tenderness, swelling or pain on movement.      Right Ear: Tympanic membrane, ear canal and external ear normal. No drainage, swelling or tenderness. No middle ear effusion. There is no impacted cerumen. No foreign body. No mastoid tenderness. Tympanic membrane is not injected, perforated, erythematous or bulging.      Left Ear: Tympanic membrane, ear canal and external ear normal. No drainage, swelling or tenderness.  No middle ear effusion. There is no impacted cerumen. No foreign body. No mastoid tenderness. Tympanic membrane is not injected,  perforated, erythematous or bulging.      Nose: No nasal tenderness, mucosal edema, congestion or rhinorrhea.      Right Nostril: No foreign body or epistaxis.      Left Nostril: No foreign body or epistaxis.      Right Turbinates: Not swollen or pale.      Left Turbinates: Not swollen or pale.      Right Sinus: No maxillary sinus tenderness or frontal sinus tenderness.      Left Sinus: No maxillary sinus tenderness or frontal sinus tenderness.      Mouth/Throat:      Lips: Pink.      Mouth: Mucous membranes are moist.      Pharynx: Oropharynx is clear. Uvula midline. No pharyngeal swelling, oropharyngeal exudate, posterior oropharyngeal erythema, uvula swelling or postnasal drip.      Tonsils: No tonsillar exudate or tonsillar abscesses.   Eyes:      General: Lids are normal. No visual field deficit.  Right eye: No discharge.         Left eye: No discharge.      Extraocular Movements:      Right eye: Normal extraocular motion and no nystagmus.      Left eye: Normal extraocular motion and no nystagmus.      Conjunctiva/sclera: Conjunctivae normal.      Right eye: Right conjunctiva is not injected. No exudate.     Left eye: Left conjunctiva is not injected. No exudate.     Pupils: Pupils are equal.      Right eye: Pupil is round, reactive and not sluggish.      Left eye: Pupil is round, reactive and not sluggish.   Cardiovascular:      Rate and Rhythm: Normal rate and regular rhythm.      Pulses:           Radial pulses are 3+ on the right side and 3+ on the left side.      Heart sounds: Normal heart sounds, S1 normal and S2 normal.   Pulmonary:      Effort: Pulmonary effort is normal. No tachypnea, bradypnea, accessory muscle usage, respiratory distress or retractions.      Breath sounds: Normal breath sounds. No stridor. No decreased breath sounds, wheezing, rhonchi or rales.   Abdominal:      General: Bowel sounds are normal. There is no distension.      Palpations: Abdomen is soft.      Tenderness: There is  no abdominal tenderness. There is no guarding or rebound.      Comments: Modified NIH Stroke Scale/Score (mNIHSS)     RESULT SUMMARY:  0 points  Modified NIH Stroke Scale      INPUTS:  1B: Level of consciousness/orientation questions -> 0 = Both questions correct  1C: Level of consciousness commands -> 0 = Both tasks correct  2: Test horizontal extra-ocular movements -> 0 = Normal  3: Test visual fields -> 0 = No visual loss  5A: Test left arm motor drift -> 0 = No drift  5B: Test right arm motor drift -> 0 = No drift  6A: Test left leg motor drift -> 0 = No drift  6B: Test right leg motor drift -> 0 = No drift  8: Test sensation -> 0 = Normal; no sensory loss  9: Test language/aphasia -> 0 = Normal; no aphasia  11: Test extinction/inattention/neglect -> 0 = Normal     Musculoskeletal:      Cervical back: Neck supple. No edema, erythema, rigidity, tenderness or crepitus. No pain with movement, spinous process tenderness or muscular tenderness. Normal range of motion.      Thoracic back: No tenderness or bony tenderness. Normal range of motion.      Lumbar back: No tenderness or bony tenderness. Normal range of motion.   Lymphadenopathy:      Head:      Right side of head: No submental, submandibular, tonsillar, preauricular, posterior auricular or occipital adenopathy.      Left side of head: No submental, submandibular, tonsillar, preauricular, posterior auricular or occipital adenopathy.      Cervical: No cervical adenopathy.      Right cervical: No superficial, deep or posterior cervical adenopathy.     Left cervical: No superficial, deep or posterior cervical adenopathy.   Skin:     General: Skin is warm and dry.      Capillary Refill: Capillary refill takes less than 2 seconds.  Coloration: Skin is not ashen, cyanotic, jaundiced, mottled, pale or sallow.   Neurological:      Mental Status: He is alert and oriented to person, place, and time.      Cranial Nerves: No cranial nerve deficit, dysarthria or  facial asymmetry.      Sensory: Sensation is intact.      Motor: No weakness or atrophy.      Coordination: Coordination normal.      Gait: Gait normal.      Comments: When patient goes to pick something off the ground he stumbled without falling reports having bad dizziness and head pressure.   Psychiatric:         Attention and Perception: Attention normal.         Mood and Affect: Mood and affect normal.         Speech: Speech normal.         Behavior: Behavior normal. Behavior is cooperative.         Judgment: Judgment is not impulsive or inappropriate.        Vitals:    06/29/24 1547 06/29/24 1600   BP: 134/80 119/72   BP Site: Left Upper Arm Left Upper Arm   Patient Position: Sitting Sitting   BP Cuff Size: Large Adult Large Adult   Pulse: 80    Resp: 16    Temp: 98.3 F (36.8 C)    TempSrc: Oral    SpO2: 96% 97%   Weight: 114.3 kg (252 lb)      Current Outpatient Medications   Medication Sig Dispense Refill    montelukast sodium (SINGULAIR) 4 MG PACK Take 1 packet by mouth nightly      metFORMIN  (GLUCOPHAGE ) 500 MG tablet TAKE 1 TABLET BY MOUTH TWICE A DAY WITH MEALS 180 tablet 1    atorvastatin  (LIPITOR) 20 MG tablet TAKE 1 TABLET BY MOUTH EVERY DAY IN THE EVENING 90 tablet 3    Hypertonic Nasal Wash (SINUS RINSE BOTTLE KIT) PACK 1 kit by Nasal route 4 times daily as needed (congestion) 1 each 0    metoprolol  succinate (TOPROL  XL) 50 MG extended release tablet Take 1 tablet by mouth daily After a meal 90 tablet 1    valsartan -hydroCHLOROthiazide  (DIOVAN -HCT) 160-25 MG per tablet Take 1 tablet by mouth daily 90 tablet 1    tamsulosin  (FLOMAX ) 0.4 MG capsule Take 1 capsule by mouth daily 90 capsule 1    acetaminophen  (TYLENOL ) 325 MG tablet Take 2 tablets by mouth 4 times daily as needed for Pain      ondansetron  (ZOFRAN -ODT) 4 MG disintegrating tablet Take 1 tablet by mouth 3 times daily as needed for Nausea or Vomiting 8 tablet 0    pantoprazole  (PROTONIX ) 40 MG tablet Take 1 tablet by mouth 2 times daily       aspirin 81 MG EC tablet Take by mouth daily      albuterol  sulfate HFA (VENTOLIN  HFA) 108 (90 Base) MCG/ACT inhaler Inhale 2 puffs into the lungs 4 times daily as needed for Wheezing (Patient not taking: Reported on 06/29/2024) 54 g 1    famotidine  (PEPCID ) 20 MG tablet Take 1 tablet by mouth 2 times daily (Patient not taking: Reported on 06/29/2024) 30 tablet 0    ipratropium 0.5 mg-albuterol  2.5 mg (DUONEB ) 0.5-2.5 (3) MG/3ML SOLN nebulizer solution Inhale 3 mLs into the lungs every 4 hours (Patient not taking: Reported on 06/29/2024) 360 mL 0    Budeson-Glycopyrrol-Formoterol (BREZTRI AEROSPHERE) 160-9-4.8 MCG/ACT AERO Inhale  2 puffs into the lungs daily (Patient not taking: Reported on 06/29/2024)      albuterol  sulfate HFA (PROVENTIL ;VENTOLIN ;PROAIR ) 108 (90 Base) MCG/ACT inhaler Inhale 2 puffs into the lungs every 4 hours as needed (Patient not taking: Reported on 06/29/2024)      montelukast (SINGULAIR) 10 MG tablet Take 1 tablet by mouth nightly (Patient not taking: Reported on 06/29/2024)      fluticasone  (FLONASE) 50 MCG/ACT nasal spray by Nasal route daily as needed for Allergies (Patient not taking: Reported on 06/29/2024)       No current facility-administered medications for this visit.        PAST MEDICAL HISTORY:   Medical: Pt  has a past medical history of Asthma, Diabetes (HCC), Erectile dysfunction, GERD (gastroesophageal reflux disease), High cholesterol, Hypertension, Obesity, and OSA (obstructive sleep apnea).  Surgical: Pt  has a past surgical history that includes Cholecystectomy; hernia repair; Upper gastrointestinal endoscopy; Lumbar spine surgery (1996); Tonsillectomy; Colonoscopy; and hernia repair (N/A, 01/03/2024).  Family: Pt family history includes Cancer in his father; Hypertension in his mother; No Known Problems in his daughter, daughter, son, and son.  Social: Pt   Social History     Socioeconomic History    Marital status: Single     Spouse name: Not on file    Number of children: 4     Years of education: 12    Highest education level: 12th grade   Occupational History    Not on file   Tobacco Use    Smoking status: Former     Current packs/day: 0.00     Average packs/day: 0.5 packs/day for 15.0 years (7.5 ttl pk-yrs)     Types: Cigarettes     Start date: 10/03/1986     Quit date: 10/02/2001     Years since quitting: 22.7     Passive exposure: Past    Smokeless tobacco: Never   Vaping Use    Vaping status: Never Used   Substance and Sexual Activity    Alcohol use: Yes     Alcohol/week: 7.0 standard drinks of alcohol     Types: 7 Glasses of wine per week     Comment: every day    Drug use: Never    Sexual activity: Not Currently     Partners: Female   Other Topics Concern    Not on file   Social History Narrative    Not on file     Social Drivers of Health     Financial Resource Strain: Low Risk  (11/08/2022)    Overall Financial Resource Strain (CARDIA)     Difficulty of Paying Living Expenses: Not hard at all   Food Insecurity: No Food Insecurity (02/08/2024)    Hunger Vital Sign     Worried About Running Out of Food in the Last Year: Never true     Ran Out of Food in the Last Year: Never true   Transportation Needs: No Transportation Needs (07/18/2023)    PRAPARE - Therapist, Art (Medical): No     Lack of Transportation (Non-Medical): No   Physical Activity: Inactive (02/08/2024)    Exercise Vital Sign     Days of Exercise per Week: 0 days     Minutes of Exercise per Session: 0 min   Stress: No Stress Concern Present (10/29/2021)    [employer] of Occupational Health - Occupational Stress Questionnaire     Feeling of Stress : Not at all  Social Connections: Socially Isolated (10/29/2021)    Social Connection and Isolation Panel     Frequency of Communication with Friends and Family: Three times a week     Frequency of Social Gatherings with Friends and Family: Three times a week     Attends Religious Services: Never     Active Member of Clubs or Organizations: No      Attends Banker Meetings: Never     Marital Status: Separated   Intimate Partner Violence: Not At Risk (10/29/2021)    Humiliation, Afraid, Rape, and Kick questionnaire     Fear of Current or Ex-Partner: No     Emotionally Abused: No     Physically Abused: No     Sexually Abused: No   Housing Stability: Low Risk  (07/18/2023)    Housing Stability Vital Sign     Unable to Pay for Housing in the Last Year: No     Number of Times Moved in the Last Year: 0     Homeless in the Last Year: No       Allergies: Patient  Allergies   Allergen Reactions    Lisinopril Cough       Patient Care Team:  Kennie Darin MATSU, MD as PCP - General (Family Medicine)  Kennie, Darin MATSU, MD as PCP - Empaneled Provider  Chaudhry, Emery GORMAN LABOR, MD as Consulting Physician (Cardiovascular Disease)      DIAGNOSTICS:     No results found for any visits on 06/29/24.     The results of the diagnostic studies have been independently interpreted by myself: All studies will be over-read by Radiologist and patient will be called if any changes to treatment or diagnosis based on final read.       I ADVISED PATIENT TO GO TO EMERGENCY DEPARTMENT IF SYMPTOMS WORSEN , CHANGE OR FAILS TO IMPROVE.    I have discussed the diagnosis with the patient and the intended plan as seen in the above orders.  The patient also understands that early in the process of an illness, the urgent care workup can be falsely reassuring. Routine discharge counseling and specific return precautions dicussed with the patient and the patient understand that worsening, changing or persistent symptoms should prompt an immediate return to an urgent care or emergency department. Patient/Guardian expressed understanding and agrees with the discharge instructions. No further questions at this time of discharge. The patient has received an after-visit summary and questions were answered concerning future plans. I have discussed medication side effects and warnings with the patient  as well. The patient agrees and understands above plan.       (Please note that parts of this dictation were completed with voice recognition software. Quite often unanticipated grammatical, syntax, homophones, and other interpretive errors are inadvertently transcribed by the computer software. Efforts were made to edit the dictation but occasionally words remain mis-transcribed.)     An electronic signature was used to authenticate this note.  -- Trellis Fischer, APRN - NP

## 2024-06-29 NOTE — Discharge Instructions (Signed)
 Discussed visit today. You were seen in the ER today for continued headache. All your labs and imaging were reassuring. Please follow-up with PCP. A medication was sent to your pharmacy to help with your headaches.     Return to the ER with any worsening of symptoms.

## 2024-06-29 NOTE — ED Triage Notes (Signed)
 Pt arrives from home for headaches that have been daily for a month. Pt was seen at a hospital yesterday and was given a few medications that helped. The headache came back today and is worse than yesterday.

## 2024-06-29 NOTE — Patient Instructions (Signed)
 Thank you for visiting [facility]    Patient has been having greater than 2 weeks of persistent headache accompanied with dizziness with movement, and ringing in his ears that is intermittent.  Presented to the emergency department yesterday had potassium of 3.3 that was not replaced.  Additionally had normal EKG and no significant blood work findings.  Patient was given butalbital /acetaminophen /caffeine , normal saline, Toradol  injection, and dexamethasone , patient reported that he felt initially better however this morning significant headache reoccurred and he needs some relief.  CT of the head not performed during visit.  Patient with severe headache sending to emergency department for further evaluation.    Due to the severity of your illness I advise you go to the emergency department NOW for further evaluation and assessment.      Please go immediately to the Emergency Department NOW     If you develop Lethargy, Chest Pain, Shortness of Breath, Irretractable fevers, New numbness or tingling in extremity, Severe and sudden onset of pain or other emergency symptoms call 911/EMS for prompt care.

## 2024-06-29 NOTE — ED Provider Notes (Signed)
 [facility]  EMERGENCY DEPARTMENT ENCOUNTER      Pt Name: Jason Greene  MRN: [id number]  Birthdate 10-Oct-1970  Date of evaluation: 06/29/2024  Provider: Elio Moats, PA-C    CHIEF COMPLAINT       Chief Complaint   Patient presents with    Headache         HISTORY OF PRESENT ILLNESS    Patient is an 54 y.o. male with history of asthma, diabetes, GERD, high cholesterol, hypertension, obesity and OSA who presents to the ER with reports of a headache x 1 month. Patient reports he has had this everyday, but increases and decreases in intensity. Patient was seen yesterday at out outside hospital with decreased symptoms, but reports the headache came back this morning. Patient was seen at Urgent Care and referred to the ER. Patient reports ringing in ears and lightheaded with change in position.  No visual change, weakness, or gait instability. Patient denies chest pain, shortness of breath, abdominal pain, urinary symptoms, nausea or vomiting, diarrhea or constipation, fever or chills.  Patient reports alcohol use, former cigarette smoking, denies vaping or illicit drug use.          Nursing Notes were reviewed.    REVIEW OF SYSTEMS       Review of Systems      PAST MEDICAL HISTORY     Past Medical History:   Diagnosis Date    Asthma     Diabetes (HCC)     oral agents    Erectile dysfunction     GERD (gastroesophageal reflux disease)     High cholesterol     Hypertension     Dr. Lindle    Obesity     OSA (obstructive sleep apnea)     uses CPAP         SURGICAL HISTORY       Past Surgical History:   Procedure Laterality Date    CHOLECYSTECTOMY      COLONOSCOPY      HERNIA REPAIR      HERNIA REPAIR N/A 01/03/2024    ROBOT ASSISTED LAPAROSCOPIC RECURRENT LEFT INGUINAL HERNIA REPAIR WITH MESH performed by Geni Derick SAILOR, MD at [facility]    LUMBAR SPINE SURGERY  1996    TONSILLECTOMY      UPPER GASTROINTESTINAL ENDOSCOPY           CURRENT MEDICATIONS       Previous Medications    ACETAMINOPHEN  (TYLENOL )  325 MG TABLET    Take 2 tablets by mouth 4 times daily as needed for Pain    ALBUTEROL  SULFATE HFA (PROVENTIL ;VENTOLIN ;PROAIR ) 108 (90 BASE) MCG/ACT INHALER    Inhale 2 puffs into the lungs every 4 hours as needed    ALBUTEROL  SULFATE HFA (VENTOLIN  HFA) 108 (90 BASE) MCG/ACT INHALER    Inhale 2 puffs into the lungs 4 times daily as needed for Wheezing    ASPIRIN 81 MG EC TABLET    Take by mouth daily    ATORVASTATIN  (LIPITOR) 20 MG TABLET    TAKE 1 TABLET BY MOUTH EVERY DAY IN THE EVENING    BUDESON-GLYCOPYRROL-FORMOTEROL (BREZTRI AEROSPHERE) 160-9-4.8 MCG/ACT AERO    Inhale 2 puffs into the lungs daily    FAMOTIDINE  (PEPCID ) 20 MG TABLET    Take 1 tablet by mouth 2 times daily    FLUTICASONE  (FLONASE) 50 MCG/ACT NASAL SPRAY    by Nasal route daily as needed for Allergies    HYPERTONIC NASAL WASH (  SINUS RINSE BOTTLE KIT) PACK    1 kit by Nasal route 4 times daily as needed (congestion)    IPRATROPIUM 0.5 MG-ALBUTEROL  2.5 MG (DUONEB ) 0.5-2.5 (3) MG/3ML SOLN NEBULIZER SOLUTION    Inhale 3 mLs into the lungs every 4 hours    METFORMIN  (GLUCOPHAGE ) 500 MG TABLET    TAKE 1 TABLET BY MOUTH TWICE A DAY WITH MEALS    METOPROLOL  SUCCINATE (TOPROL  XL) 50 MG EXTENDED RELEASE TABLET    Take 1 tablet by mouth daily After a meal    MONTELUKAST (SINGULAIR) 10 MG TABLET    Take 1 tablet by mouth nightly    MONTELUKAST SODIUM (SINGULAIR) 4 MG PACK    Take 1 packet by mouth nightly    ONDANSETRON  (ZOFRAN -ODT) 4 MG DISINTEGRATING TABLET    Take 1 tablet by mouth 3 times daily as needed for Nausea or Vomiting    PANTOPRAZOLE  (PROTONIX ) 40 MG TABLET    Take 1 tablet by mouth 2 times daily    TAMSULOSIN  (FLOMAX ) 0.4 MG CAPSULE    Take 1 capsule by mouth daily    VALSARTAN -HYDROCHLOROTHIAZIDE  (DIOVAN -HCT) 160-25 MG PER TABLET    Take 1 tablet by mouth daily       ALLERGIES     Lisinopril    FAMILY HISTORY       Family History   Problem Relation Age of Onset    Hypertension Mother     Cancer Father     No Known Problems Son     No Known  Problems Son     No Known Problems Daughter     No Known Problems Daughter           SOCIAL HISTORY       Social History     Socioeconomic History    Marital status: Single    Number of children: 4    Years of education: 12    Highest education level: 12th grade   Tobacco Use    Smoking status: Former     Current packs/day: 0.00     Average packs/day: 0.5 packs/day for 15.0 years (7.5 ttl pk-yrs)     Types: Cigarettes     Start date: 10/03/1986     Quit date: 10/02/2001     Years since quitting: 22.7     Passive exposure: Past    Smokeless tobacco: Never   Vaping Use    Vaping status: Never Used   Substance and Sexual Activity    Alcohol use: Yes     Alcohol/week: 7.0 standard drinks of alcohol     Types: 7 Glasses of wine per week     Comment: every day    Drug use: Never    Sexual activity: Not Currently     Partners: Female     Social Drivers of Health     Financial Resource Strain: Low Risk  (11/08/2022)    Overall Financial Resource Strain (CARDIA)     Difficulty of Paying Living Expenses: Not hard at all   Food Insecurity: No Food Insecurity (02/08/2024)    Hunger Vital Sign     Worried About Running Out of Food in the Last Year: Never true     Ran Out of Food in the Last Year: Never true   Transportation Needs: No Transportation Needs (07/18/2023)    PRAPARE - Therapist, Art (Medical): No     Lack of Transportation (Non-Medical): No   Physical Activity: Inactive (02/08/2024)  Exercise Vital Sign     Days of Exercise per Week: 0 days     Minutes of Exercise per Session: 0 min   Stress: No Stress Concern Present (10/29/2021)    [employer] of Occupational Health - Occupational Stress Questionnaire     Feeling of Stress : Not at all   Social Connections: Socially Isolated (10/29/2021)    Social Connection and Isolation Panel     Frequency of Communication with Friends and Family: Three times a week     Frequency of Social Gatherings with Friends and Family: Three times a week      Attends Religious Services: Never     Active Member of Clubs or Organizations: No     Attends Banker Meetings: Never     Marital Status: Separated   Intimate Partner Violence: Not At Risk (10/29/2021)    Humiliation, Afraid, Rape, and Kick questionnaire     Fear of Current or Ex-Partner: No     Emotionally Abused: No     Physically Abused: No     Sexually Abused: No   Housing Stability: Low Risk  (07/18/2023)    Housing Stability Vital Sign     Unable to Pay for Housing in the Last Year: No     Number of Times Moved in the Last Year: 0     Homeless in the Last Year: No       PHYSICAL EXAM       Physical Exam  Vitals reviewed.   Constitutional:       General: He is not in acute distress.     Appearance: Normal appearance. He is not ill-appearing or toxic-appearing.   HENT:      Head: Normocephalic and atraumatic.      Nose: Nose normal.      Mouth/Throat:      Mouth: Mucous membranes are moist.      Pharynx: Oropharynx is clear.   Eyes:      Extraocular Movements: Extraocular movements intact.      Conjunctiva/sclera: Conjunctivae normal.      Pupils: Pupils are equal, round, and reactive to light.   Cardiovascular:      Rate and Rhythm: Normal rate and regular rhythm.      Pulses: Normal pulses.      Heart sounds: Normal heart sounds.   Pulmonary:      Effort: Pulmonary effort is normal.      Breath sounds: Normal breath sounds.   Musculoskeletal:         General: Normal range of motion.      Cervical back: Normal range of motion and neck supple.   Skin:     General: Skin is warm.      Capillary Refill: Capillary refill takes less than 2 seconds.   Neurological:      General: No focal deficit present.      Mental Status: He is alert.      GCS: GCS eye subscore is 4. GCS verbal subscore is 5. GCS motor subscore is 6.      Cranial Nerves: Cranial nerves 2-12 are intact.      Sensory: Sensation is intact.      Motor: Motor function is intact.      Coordination: Coordination is intact.      Gait: Gait is  intact.   Psychiatric:         Mood and Affect: Mood normal.         Behavior: Behavior  normal.           EMERGENCY DEPARTMENT COURSE and DIFFERENTIAL DIAGNOSIS/MDM:   Vitals:    Vitals:    06/29/24 1725 06/29/24 1943   BP: (!) 142/92 139/75   Pulse: 72 67   Resp: 18 16   Temp: 98.2 F (36.8 C) 97.7 F (36.5 C)   TempSrc: Oral    SpO2: 99% 91%   Weight: 125.3 kg (276 lb 3.8 oz)    Height: 1.803 m (5' 11)        Medical Decision Making  Patient is an 54 y.o. male with history of asthma, diabetes, GERD, high cholesterol, hypertension, obesity and OSA who presents to the ER with reports of a headache x 1 month. Ddx: Dehydration, electrolyte abnormality, migraines, headaches, hemorrhage, mass, aneurysm, and others.  Physical examination shows unremarkable cardiopulmonary examination.  Vitals are stable with slightly elevated blood pressure reading.  Patient is in no acute distress during my evaluation in triage.  Neurologically intact with no deficit seen during my evaluation.  Migraine cocktail ordered.    CBC shows leukocytosis at 12.5 which has increased from yesterday, but likely due to steroid use. CMP shows hyperglycemia at 126, but otherwise unremarkable.  CT head shows no acute process.  CTA head and neck shows no significant vascular disease. No acute pathology in the head and neck. Discussed results with the patient who reports feeling better. He reports pain down to 5/10. Discussed and recommended follow-up with PCP and neurology for this chronic headache. Will attempt Fioricet  prescription. Patient is in no acute distress and stable for discharge. Patient is agreeable to plan. All questions answered. Return precautions provided and discharged home at this time.     Presentation, management, and disposition were discussed with the attending physician, Dr. Pietro who is in agreement with plan of care.    Problems Addressed:  Chronic nonintractable headache, unspecified headache type: chronic illness or  injury    Amount and/or Complexity of Data Reviewed  Labs: ordered. Decision-making details documented in ED Course.  Radiology: ordered. Decision-making details documented in ED Course.    Risk  Prescription drug management.      Procedures    FINAL IMPRESSION      1. Chronic nonintractable headache, unspecified headache type          DISPOSITION/PLAN   DISPOSITION Decision To Discharge 06/29/2024 07:32:25 PM      PATIENT REFERRED TO:  [facility]  [address]  [place]  [address]  [phone]  Go to   As needed, If symptoms worsen    Kennie Darin MATSU, MD  [address] [place] [address]  [phone]    Schedule an appointment as soon as possible for a visit       [facility]  [address]  [place]  [address]  [phone]  Schedule an appointment as soon as possible for a visit         DISCHARGE MEDICATIONS:  New Prescriptions    BUTALBITAL -ACETAMINOPHEN -CAFFEINE  (FIORICET , ESGIC ) 50-325-40 MG PER TABLET    Take 1 tablet by mouth every 4 hours as needed for Headaches Courtland Crenshaw, DO     Controlled Substances Monitoring:          No data to display                (Please note that portions of this note were completed with a voice recognition program.  Efforts were made to edit the dictations  but occasionally words are mis-transcribed.)    Elio Moats, PA-C (electronically signed)  Physician Assistant            Moats Elio, PA-C  06/29/24 1944

## 2024-06-30 MED ORDER — BUTALBITAL-APAP-CAFFEINE 50-325-40 MG PO TABS
50-325-40 | ORAL_TABLET | ORAL | 0 refills | Status: AC | PRN
Start: 2024-06-30 — End: ?

## 2024-06-30 MED ORDER — KETOROLAC TROMETHAMINE 30 MG/ML IJ SOLN
30 | Freq: Once | INTRAMUSCULAR | Status: AC
Start: 2024-06-30 — End: 2024-06-29
  Administered 2024-06-30: 15 mg via INTRAVENOUS

## 2024-06-30 MED FILL — KETOROLAC TROMETHAMINE 30 MG/ML IJ SOLN: 30 mg/mL | INTRAMUSCULAR | Qty: 1

## 2024-07-03 LAB — EKG 12-LEAD
Atrial Rate: 77 {beats}/min
Diagnosis: NORMAL
P Axis: 40 degrees
P-R Interval: 176 ms
Q-T Interval: 380 ms
QRS Duration: 108 ms
QTc Calculation (Bazett): 430 ms
R Axis: -38 degrees
T Axis: 0 degrees
Ventricular Rate: 77 {beats}/min
